# Patient Record
Sex: Male | Born: 1945
Health system: Southern US, Community
[De-identification: ages and names within clinical notes are randomized; demographics above are authoritative.]

## PROBLEM LIST (undated history)

## (undated) DIAGNOSIS — N529 Male erectile dysfunction, unspecified: Secondary | ICD-10-CM

## (undated) DIAGNOSIS — R3912 Poor urinary stream: Secondary | ICD-10-CM

## (undated) DIAGNOSIS — Z87440 Personal history of urinary (tract) infections: Secondary | ICD-10-CM

## (undated) DIAGNOSIS — B019 Varicella without complication: Secondary | ICD-10-CM

## (undated) DIAGNOSIS — R7303 Prediabetes: Secondary | ICD-10-CM

## (undated) DIAGNOSIS — I5021 Acute systolic (congestive) heart failure: Secondary | ICD-10-CM

## (undated) DIAGNOSIS — E785 Hyperlipidemia, unspecified: Secondary | ICD-10-CM

## (undated) DIAGNOSIS — I2510 Atherosclerotic heart disease of native coronary artery without angina pectoris: Secondary | ICD-10-CM

## (undated) DIAGNOSIS — Z973 Presence of spectacles and contact lenses: Secondary | ICD-10-CM

## (undated) DIAGNOSIS — C61 Malignant neoplasm of prostate: Secondary | ICD-10-CM

## (undated) DIAGNOSIS — D649 Anemia, unspecified: Secondary | ICD-10-CM

## (undated) DIAGNOSIS — R3915 Urgency of urination: Secondary | ICD-10-CM

## (undated) DIAGNOSIS — R972 Elevated prostate specific antigen [PSA]: Secondary | ICD-10-CM

## (undated) DIAGNOSIS — I5032 Chronic diastolic (congestive) heart failure: Secondary | ICD-10-CM

## (undated) DIAGNOSIS — I10 Essential (primary) hypertension: Secondary | ICD-10-CM

## (undated) HISTORY — PX: PROSTATE BIOPSY: SHX241

## (undated) HISTORY — DX: Essential (primary) hypertension: I10

## (undated) HISTORY — PX: TURP VAPORIZATION: SUR1397

## (undated) HISTORY — DX: Anemia, unspecified: D64.9

## (undated) HISTORY — DX: Varicella without complication: B01.9

---

## 2005-01-07 ENCOUNTER — Ambulatory Visit: Payer: Self-pay | Admitting: Urology

## 2005-02-11 ENCOUNTER — Ambulatory Visit: Payer: Self-pay | Admitting: Urology

## 2005-06-17 ENCOUNTER — Ambulatory Visit: Payer: Self-pay | Admitting: Internal Medicine

## 2005-06-18 DIAGNOSIS — C61 Malignant neoplasm of prostate: Secondary | ICD-10-CM

## 2005-06-18 HISTORY — DX: Malignant neoplasm of prostate: C61

## 2011-05-06 ENCOUNTER — Emergency Department: Payer: Self-pay | Admitting: Emergency Medicine

## 2011-05-16 ENCOUNTER — Emergency Department: Payer: Self-pay | Admitting: Emergency Medicine

## 2013-07-22 DIAGNOSIS — N320 Bladder-neck obstruction: Secondary | ICD-10-CM

## 2013-07-22 HISTORY — DX: Bladder-neck obstruction: N32.0

## 2015-11-15 ENCOUNTER — Ambulatory Visit (INDEPENDENT_AMBULATORY_CARE_PROVIDER_SITE_OTHER): Payer: BLUE CROSS/BLUE SHIELD

## 2015-11-15 ENCOUNTER — Ambulatory Visit
Admission: EM | Admit: 2015-11-15 | Discharge: 2015-11-15 | Disposition: A | Discharge status: Home / Self Care | Payer: BLUE CROSS/BLUE SHIELD

## 2015-11-15 DIAGNOSIS — H6123 Impacted cerumen, bilateral: Secondary | ICD-10-CM

## 2015-11-15 DIAGNOSIS — R05 Cough: Secondary | ICD-10-CM

## 2015-11-15 DIAGNOSIS — I517 Cardiomegaly: Secondary | ICD-10-CM

## 2015-11-15 DIAGNOSIS — R059 Cough, unspecified: Secondary | ICD-10-CM

## 2015-11-15 HISTORY — DX: Malignant neoplasm of prostate: C61

## 2015-11-15 MED ORDER — LEVOFLOXACIN 500 MG PO TABS: 500.0000 mg | ORAL_TABLET | Freq: Every day | ORAL | 0 refills | Status: DC

## 2015-11-15 NOTE — ED Triage Notes (Signed)
Patient c/o nagging cough for about 2 months, he says he has been diagnosis with allergies but he doesnt feel like that the problem. The cough comes and goes, it is worse at night when he lays down.

## 2015-11-15 NOTE — ED Provider Notes (Signed)
CSN: [id number]     Arrival date & time 11/15/15  [id number] History   First MD Initiated Contact with Patient 11/15/15 0913     Chief Complaint  Patient presents with  . Cough   (Consider location/radiation/quality/duration/timing/severity/associated sxs/prior Treatment) HPI  This a 70 year old male who is accompanied by his wife waning of a two-month history of nagging cough. He has seen other providers who have diagnosed him with allergies and he has used over-the-counter medications without success. They state is worse at nighttime when he lays down and his wife is noticed that he has been wheezing a lot. In addition he has exertional dyspnea which is worsening over time. Cough is nonproductive. He denies any fever or chills. He is afebrile today and his O2 sats are 100% on room air. He has no history of asthma or COPD. He has never smoked. They have no primary care physician he only sees Dr. Burt Knack who is his cancer specialist for the prostatic cancer that he's had. He thinks they remember that the cough began after a mild cold that he had initially.He denies any PND or orthopnea.      Past Medical History:  Diagnosis Date  . Prostate cancer [facility])    Past Surgical History:  Procedure Laterality Date  . TURP VAPORIZATION     Family History  Problem Relation Age of Onset  . Hypertension Mother   . Healthy Father    Social History  Substance Use Topics  . Smoking status: Never Smoker  . Smokeless tobacco: Never Used  . Alcohol use No    Review of Systems  Constitutional: Positive for activity change. Negative for chills, diaphoresis, fatigue and fever.  HENT: Negative for congestion, sinus pressure and sore throat.   Respiratory: Positive for cough and shortness of breath.   All other systems reviewed and are negative.   Allergies  Review of patient's allergies indicates no known allergies.  Home Medications   Prior to Admission medications   Medication Sig Start Date End  Date Taking? Authorizing Provider  finasteride (PROSCAR) 5 MG tablet Take 5 mg by mouth daily.   Yes Historical Provider, MD  levofloxacin (LEVAQUIN) 500 MG tablet Take 1 tablet (500 mg total) by mouth daily. 11/15/15   Lorin Picket, PA-C   Meds Ordered and Administered this Visit  Medications - No data to display  BP 119/83 (BP Location: Left Arm)   Pulse 88   Temp 97.5 F (36.4 C) (Oral)   Resp 18   Ht 6\' 1"  (1.854 m)   Wt 235 lb (106.6 kg)   SpO2 100%   BMI 31.00 kg/m  No data found.   Physical Exam  Constitutional: He is oriented to person, place, and time. He appears well-developed and well-nourished.  HENT:  Head: Normocephalic and atraumatic.  Right Ear: External ear normal.  Left Ear: External ear normal.  Nose: Nose normal.  Mouth/Throat: Oropharynx is clear and moist. No oropharyngeal exudate.  Patient has bilateral cerumen impaction  Eyes: EOM are normal. Pupils are equal, round, and reactive to light. Right eye exhibits no discharge. Left eye exhibits no discharge.  Neck: Normal range of motion. Neck supple.  Pulmonary/Chest: Effort normal. He has rales.  Patient has bibasilar rales most prevalent on the right.  Musculoskeletal: Normal range of motion. He exhibits edema.  Patient has 1-2+ edema of the bilateral lower extremities.  Neurological: He is alert and oriented to person, place, and time.  Skin: Skin is warm and dry.  Psychiatric: He has a normal mood and affect. His behavior is normal. Judgment and thought content normal.  Nursing note and vitals reviewed.   Urgent Care Course   Clinical Course    Procedures (including critical care time)  Labs Review Labs Reviewed - No data to display  Imaging Review Dg Chest 2 View  Result Date: 11/15/2015 CLINICAL DATA:  Cough for 2 months.  Shortness of breath for 2 weeks EXAM: CHEST  2 VIEW COMPARISON:  None. FINDINGS: There is no edema or consolidation. There is cardiomegaly with mild pulmonary  venous hypertension. No adenopathy. There is degenerative change in the thoracic spine. IMPRESSION: There is a degree of pulmonary vascular congestion without frank edema or consolidation. Electronically Signed   By: Lowella Grip III M.D.   On: 11/15/2015 09:47     Visual Acuity Review  Right Eye Distance:   Left Eye Distance:   Bilateral Distance:    Right Eye Near:   Left Eye Near:    Bilateral Near:     Patient had both ears irrigated and curetted to remove the cerumen.    MDM   1. Cough   2. Cardiomegaly   3. Cerumen impaction, bilateral    Discharge Medication List as of 11/15/2015 10:33 AM    START taking these medications   Details  levofloxacin (LEVAQUIN) 500 MG tablet Take 1 tablet (500 mg total) by mouth daily., Starting Thu 11/15/2015, Normal      Plan: 1. Test/x-ray results and diagnosis reviewed with patient 2. rx as per orders; risks, benefits, potential side effects reviewed with patient 3. Recommend supportive treatment with Decreased salt intake. She is wife will make an appointment with a primary care physician this week for further evaluation and treatment. Her primary care physician should have access to our medical records that she can return to our facility for a disc of his chest x-ray. Start him empirically on levofloxacin which may be altered by the primary care physician at his discretion. 4. F/u prn if symptoms worsen or don't improve     Lorin Picket, PA-C 11/15/15 1037

## 2015-11-15 NOTE — Discharge Instructions (Signed)
Arrange an appointment with the primary care physician within the next week for further evaluation and treatment.

## 2015-11-21 ENCOUNTER — Emergency Department: Payer: BLUE CROSS/BLUE SHIELD

## 2015-11-21 ENCOUNTER — Inpatient Hospital Stay
Admission: EM | Admit: 2015-11-21 | Discharge: 2015-11-22 | DRG: 286 | Disposition: A | Discharge status: Other Acute Inpatient Hospital | Payer: BLUE CROSS/BLUE SHIELD | Attending: Internal Medicine | Admitting: Internal Medicine

## 2015-11-21 ENCOUNTER — Inpatient Hospital Stay (HOSPITAL_COMMUNITY)
Admit: 2015-11-21 | Discharge: 2015-11-21 | Disposition: A | Discharge status: Home / Self Care | Payer: BLUE CROSS/BLUE SHIELD | Attending: Internal Medicine | Admitting: Internal Medicine

## 2015-11-21 ENCOUNTER — Encounter: Payer: Self-pay | Admitting: Internal Medicine

## 2015-11-21 DIAGNOSIS — Z951 Presence of aortocoronary bypass graft: Secondary | ICD-10-CM | POA: Diagnosis not present

## 2015-11-21 DIAGNOSIS — I509 Heart failure, unspecified: Secondary | ICD-10-CM

## 2015-11-21 DIAGNOSIS — I255 Ischemic cardiomyopathy: Secondary | ICD-10-CM | POA: Diagnosis present

## 2015-11-21 DIAGNOSIS — I25119 Atherosclerotic heart disease of native coronary artery with unspecified angina pectoris: Secondary | ICD-10-CM | POA: Diagnosis not present

## 2015-11-21 DIAGNOSIS — I5021 Acute systolic (congestive) heart failure: Secondary | ICD-10-CM

## 2015-11-21 DIAGNOSIS — Z8546 Personal history of malignant neoplasm of prostate: Secondary | ICD-10-CM | POA: Diagnosis present

## 2015-11-21 DIAGNOSIS — Z8249 Family history of ischemic heart disease and other diseases of the circulatory system: Secondary | ICD-10-CM | POA: Diagnosis not present

## 2015-11-21 DIAGNOSIS — M7989 Other specified soft tissue disorders: Secondary | ICD-10-CM

## 2015-11-21 DIAGNOSIS — I2582 Chronic total occlusion of coronary artery: Secondary | ICD-10-CM | POA: Diagnosis present

## 2015-11-21 DIAGNOSIS — Z01810 Encounter for preprocedural cardiovascular examination: Secondary | ICD-10-CM | POA: Diagnosis not present

## 2015-11-21 DIAGNOSIS — C61 Malignant neoplasm of prostate: Secondary | ICD-10-CM

## 2015-11-21 DIAGNOSIS — I2510 Atherosclerotic heart disease of native coronary artery without angina pectoris: Secondary | ICD-10-CM | POA: Diagnosis not present

## 2015-11-21 DIAGNOSIS — N179 Acute kidney failure, unspecified: Secondary | ICD-10-CM | POA: Diagnosis present

## 2015-11-21 DIAGNOSIS — J189 Pneumonia, unspecified organism: Secondary | ICD-10-CM | POA: Diagnosis present

## 2015-11-21 DIAGNOSIS — R0602 Shortness of breath: Secondary | ICD-10-CM

## 2015-11-21 DIAGNOSIS — E785 Hyperlipidemia, unspecified: Secondary | ICD-10-CM | POA: Diagnosis not present

## 2015-11-21 DIAGNOSIS — I5023 Acute on chronic systolic (congestive) heart failure: Secondary | ICD-10-CM | POA: Diagnosis present

## 2015-11-21 DIAGNOSIS — I420 Dilated cardiomyopathy: Secondary | ICD-10-CM

## 2015-11-21 HISTORY — DX: Hyperlipidemia, unspecified: E78.5

## 2015-11-21 LAB — BASIC METABOLIC PANEL
Anion gap: 8 (ref 5–15)
BUN: 15 mg/dL (ref 6–20)
CALCIUM: 8.8 mg/dL — AB (ref 8.9–10.3)
CHLORIDE: 110 mmol/L (ref 101–111)
CO2: 22 mmol/L (ref 22–32)
CREATININE: 1.17 mg/dL (ref 0.61–1.24)
GFR calc non Af Amer: >60 mL/min (ref >60)
Glucose, Bld: 127 mg/dL — ABNORMAL HIGH (ref 65–99)
Potassium: 3.9 mmol/L (ref 3.5–5.1)
SODIUM: 140 mmol/L (ref 135–145)

## 2015-11-21 LAB — CBC
HCT: 35.7 % — ABNORMAL LOW (ref 40.0–52.0)
Hemoglobin: 12.4 g/dL — ABNORMAL LOW (ref 13.0–18.0)
MCH: 32.3 pg (ref 26.0–34.0)
MCHC: 34.7 g/dL (ref 32.0–36.0)
MCV: 93.3 fL (ref 80.0–100.0)
Platelets: 143 10*3/uL — ABNORMAL LOW (ref 150–440)
RBC: 3.83 MIL/uL — ABNORMAL LOW (ref 4.40–5.90)
RDW: 15.9 % — AB (ref 11.5–14.5)
WBC: 5.2 10*3/uL (ref 3.8–10.6)

## 2015-11-21 LAB — BRAIN NATRIURETIC PEPTIDE: B NATRIURETIC PEPTIDE 5: 1125 pg/mL — AB (ref 0.0–100.0)

## 2015-11-21 LAB — ECHOCARDIOGRAM COMPLETE
HEIGHTINCHES: 73 in
Weight: 3894.4 oz

## 2015-11-21 LAB — LIPID PANEL
Cholesterol: 177 mg/dL (ref 0–200)
HDL: 41 mg/dL (ref >40)
LDL CALC: 116 mg/dL — AB (ref 0–99)
Total CHOL/HDL Ratio: 4.3 RATIO
Triglycerides: 101 mg/dL (ref <150)
VLDL: 20 mg/dL (ref 0–40)

## 2015-11-21 LAB — TSH: TSH: 1.386 u[IU]/mL (ref 0.350–4.500)

## 2015-11-21 LAB — TROPONIN I
TROPONIN I: 0.03 ng/mL — AB (ref <0.03)
TROPONIN I: 0.03 ng/mL — AB (ref <0.03)
TROPONIN I: 0.04 ng/mL — AB (ref <0.03)

## 2015-11-21 LAB — MAGNESIUM: MAGNESIUM: 2 mg/dL (ref 1.7–2.4)

## 2015-11-21 MED ORDER — SODIUM CHLORIDE 0.9% FLUSH
3.0000 mL | Freq: Two times a day (BID) | INTRAVENOUS | Status: DC
  Administered 2015-11-21: 3 mL via INTRAVENOUS

## 2015-11-21 MED ORDER — ASPIRIN 81 MG PO CHEW
81.0000 mg | CHEWABLE_TABLET | ORAL | Status: AC
  Administered 2015-11-22: 81 mg via ORAL
  Filled 2015-11-21: qty 1

## 2015-11-21 MED ORDER — FUROSEMIDE 10 MG/ML IJ SOLN
40.0000 mg | Freq: Two times a day (BID) | INTRAMUSCULAR | Status: DC
  Administered 2015-11-21 – 2015-11-22 (×3): 40 mg via INTRAVENOUS
  Filled 2015-11-21 (×3): qty 4

## 2015-11-21 MED ORDER — ENOXAPARIN SODIUM 40 MG/0.4ML ~~LOC~~ SOLN
40.0000 mg | SUBCUTANEOUS | Status: DC
Start: 2015-11-21 — End: 2015-11-22
  Administered 2015-11-21: 40 mg via SUBCUTANEOUS
  Filled 2015-11-21: qty 0.4

## 2015-11-21 MED ORDER — LEVOFLOXACIN 500 MG PO TABS
500.0000 mg | ORAL_TABLET | Freq: Every day | ORAL | Status: DC
  Administered 2015-11-21: 500 mg via ORAL
  Filled 2015-11-21: qty 1

## 2015-11-21 MED ORDER — ONDANSETRON HCL 4 MG/2ML IJ SOLN: 4.0000 mg | Freq: Four times a day (QID) | INTRAMUSCULAR | Status: DC | PRN

## 2015-11-21 MED ORDER — FINASTERIDE 5 MG PO TABS
5.0000 mg | ORAL_TABLET | Freq: Every day | ORAL | Status: DC
  Administered 2015-11-21: 5 mg via ORAL
  Filled 2015-11-21: qty 1

## 2015-11-21 MED ORDER — ACETAMINOPHEN 325 MG PO TABS: 650.0000 mg | ORAL_TABLET | Freq: Four times a day (QID) | ORAL | Status: DC | PRN

## 2015-11-21 MED ORDER — SODIUM CHLORIDE 0.9 % IV SOLN: 250.0000 mL | INTRAVENOUS | Status: DC | PRN

## 2015-11-21 MED ORDER — POTASSIUM CHLORIDE CRYS ER 20 MEQ PO TBCR
20.0000 meq | EXTENDED_RELEASE_TABLET | Freq: Two times a day (BID) | ORAL | Status: DC
  Administered 2015-11-21 (×2): 20 meq via ORAL
  Filled 2015-11-21 (×2): qty 1

## 2015-11-21 MED ORDER — SODIUM CHLORIDE 0.9 % IV SOLN: INTRAVENOUS | Status: DC

## 2015-11-21 MED ORDER — SODIUM CHLORIDE 0.9% FLUSH
3.0000 mL | INTRAVENOUS | Status: DC | PRN
Start: 2015-11-21 — End: 2015-11-22

## 2015-11-21 MED ORDER — DOCUSATE SODIUM 100 MG PO CAPS
100.0000 mg | ORAL_CAPSULE | Freq: Two times a day (BID) | ORAL | Status: DC
  Administered 2015-11-21 (×2): 100 mg via ORAL
  Filled 2015-11-21 (×2): qty 1

## 2015-11-21 MED ORDER — ONDANSETRON HCL 4 MG PO TABS: 4.0000 mg | ORAL_TABLET | Freq: Four times a day (QID) | ORAL | Status: DC | PRN

## 2015-11-21 MED ORDER — FUROSEMIDE 40 MG PO TABS
40.0000 mg | ORAL_TABLET | Freq: Once | ORAL | Status: AC
  Administered 2015-11-21: 40 mg via ORAL
  Filled 2015-11-21: qty 1

## 2015-11-21 MED ORDER — ACETAMINOPHEN 650 MG RE SUPP: 650.0000 mg | Freq: Four times a day (QID) | RECTAL | Status: DC | PRN

## 2015-11-21 NOTE — Discharge Instructions (Signed)
Heart Failure Clinic appointment on December 12, 2015 at 9:30am with Darylene Price, [place]. Please call [phone] to reschedule.

## 2015-11-21 NOTE — Care Management (Signed)
Patient admitted with new diagnosis of CHF.  Cardiology consult pending.  Patient does not have a PCP, but wife says she got an appointment with a clinic in [place] for 10/10.  Does not remember the name.  Referral to Heart Failure Clinic. has scales at home for daily weights.   Independent in all adls, denies issues accessing medical care, obtaining medications or with transportation.

## 2015-11-21 NOTE — H&P (Signed)
[place] at [place] NAME: Shane Kim    MR#:  [id number]  DATE OF BIRTH:  August 19, 1945  DATE OF ADMISSION:  11/21/2015  PRIMARY CARE PHYSICIAN: No PCP Per Patient   REQUESTING/REFERRING PHYSICIAN: Dr. Marjean Donna  CHIEF COMPLAINT:   Chief Complaint  Patient presents with  . Shortness of Breath    HISTORY OF PRESENT ILLNESS:  Shane Kim  is a 70 y.o. male with a known history of Prostate cancer presents to hospital secondary to worsening shortness of breath. Symptoms started about 3-4 weeks ago, he was getting short of breath easily on minimal exertion. Denies any fevers or chills. No nausea or vomiting or cough. Went to urgent care about a week ago and chest x-ray at that time showed early pneumonia and was started on Levaquin. Shortness of breath hasn't been improving and also he started noticing increased swelling of his both lower extremities and so presented to the emergency room. He appears very stable at this time, his oxygen saturations are stable. Breathing appears very stable. He does not have a physician and so is being admitted for diuresis.  PAST MEDICAL HISTORY:   Past Medical History:  Diagnosis Date  . Prostate cancer ([place])     PAST SURGICAL HISTORY:   Past Surgical History:  Procedure Laterality Date  . TURP VAPORIZATION      SOCIAL HISTORY:   Social History  Substance Use Topics  . Smoking status: Never Smoker  . Smokeless tobacco: Never Used  . Alcohol use No    FAMILY HISTORY:   Family History  Problem Relation Age of Onset  . Hypertension Mother   . Healthy Father     DRUG ALLERGIES:  No Known Allergies  REVIEW OF SYSTEMS:   Review of Systems  Constitutional: Negative for chills, fever, malaise/fatigue and weight loss.  HENT: Negative for ear discharge, ear pain, hearing loss, nosebleeds and tinnitus.   Eyes: Negative for blurred vision, double vision and photophobia.    Respiratory: Positive for shortness of breath. Negative for cough, hemoptysis and wheezing.   Cardiovascular: Positive for leg swelling. Negative for chest pain, palpitations and orthopnea.  Gastrointestinal: Negative for abdominal pain, constipation, diarrhea, heartburn, melena, nausea and vomiting.  Genitourinary: Negative for dysuria, frequency, hematuria and urgency.  Musculoskeletal: Negative for back pain, myalgias and neck pain.  Skin: Negative for rash.  Neurological: Negative for dizziness, tingling, tremors, sensory change, speech change, focal weakness and headaches.  Endo/Heme/Allergies: Does not bruise/bleed easily.  Psychiatric/Behavioral: Negative for depression.    MEDICATIONS AT HOME:   Prior to Admission medications   Medication Sig Start Date End Date Taking? Authorizing Provider  finasteride (PROSCAR) 5 MG tablet Take 5 mg by mouth daily.   Yes Historical Provider, MD  levofloxacin (LEVAQUIN) 500 MG tablet Take 1 tablet (500 mg total) by mouth daily. 11/15/15  Yes Lorin Picket, PA-C      VITAL SIGNS:  Blood pressure (!) 120/94, pulse 93, temperature 98.4 F (36.9 C), temperature source Oral, height 6\' 1"  (1.854 m), weight 106.6 kg (235 lb), SpO2 95 %.  PHYSICAL EXAMINATION:   Physical Exam  GENERAL:  70 y.o.-year-old patient lying in the bed with no acute distress.  EYES: Pupils equal, round, reactive to light and accommodation. No scleral icterus. Extraocular muscles intact.  HEENT: Head atraumatic, normocephalic. Oropharynx and nasopharynx clear.  NECK:  Supple, no jugular venous distention. No thyroid enlargement, no tenderness.  LUNGS: Normal breath sounds bilaterally,  no wheezing, rhonchi or crepitation.Bibasilar crackles heard both sides posteriorly. No use of accessory muscles of respiration.  CARDIOVASCULAR: S1, S2 normal. No murmurs, rubs, or gallops.  ABDOMEN: Soft, nontender, nondistended. Bowel sounds present. No organomegaly or mass.   EXTREMITIES: No  cyanosis, or clubbing. 2+ pitting edema both lower extremities noted NEUROLOGIC: Cranial nerves II through XII are intact. Muscle strength 5/5 in all extremities. Sensation intact. Gait not checked.  PSYCHIATRIC: The patient is alert and oriented x 3.  SKIN: No obvious rash, lesion, or ulcer.   LABORATORY PANEL:   CBC  Recent Labs Lab 11/21/15 0020  WBC 5.2  HGB 12.4*  HCT 35.7*  PLT 143*   ------------------------------------------------------------------------------------------------------------------  Chemistries   Recent Labs Lab 11/21/15 0020  NA 140  K 3.9  CL 110  CO2 22  GLUCOSE 127*  BUN 15  CREATININE 1.17  CALCIUM 8.8*   ------------------------------------------------------------------------------------------------------------------  Cardiac Enzymes No results for input(s): TROPONINI in the last 168 hours. ------------------------------------------------------------------------------------------------------------------  RADIOLOGY:  Dg Chest 2 View  Result Date: 11/21/2015 CLINICAL DATA:  Cough. Shortness of breath last week, now at baseline. History of prostate cancer. EXAM: CHEST  2 VIEW COMPARISON:  Chest radiograph November 15, 2015 FINDINGS: The cardiac silhouette is moderately enlarged, mediastinal silhouette is nonsuspicious. Mildly calcified aortic knob. Pulmonary vascular congestion. Small RIGHT pleural effusion. Hazy bibasilar airspace opacities. Small RIGHT pleural effusion. No pneumothorax. Soft tissue planes and included osseous structures are nonsuspicious. Moderate degenerative change of the thoracic spine. IMPRESSION: Stable cardiomegaly and pulmonary vascular congestion. Trace RIGHT pleural effusion. Bibasilar atelectasis, confluent edema, less likely early pneumonia. Electronically Signed   By: Elon Alas M.D.   On: 11/21/2015 04:29    EKG:   Orders placed or performed during the hospital encounter of 11/21/15  .  ED EKG  . ED EKG    IMPRESSION AND PLAN:   Shane Kim  is a 70 y.o. male with a known history of Prostate cancer presents to hospital secondary to worsening shortness of breath.  #1 new onset CHF-admit to telemetry, recycle troponins. -Low sodium diet, daily weight checks. -Cardiology consulted. Echocardiogram ordered. -Started on Lasix IV twice a day at this time -Check proBNP tomorrow and chest x-ray tomorrow  #2 pneumonia recently started on Levaquin for pneumonia. Will need 2 more days to finish off the course.  #3 history of prostate cancer-stable. Continue Proscar  #4 CK D-borderline renal function. No prior baseline available. Continue to monitor while on Lasix.  #5 DVT prophylaxis-Lovenox    All the records are reviewed and case discussed with ED provider. Management plans discussed with the patient, family and they are in agreement.  CODE STATUS: Full code  TOTAL TIME TAKING CARE OF THIS PATIENT: 50 minutes.    Gladstone Lighter M.D on 11/21/2015 at 7:27 AM  Between 7am to 6pm - Pager - [phone]  After 6pm go to [email or web address] - password [email or web address] [place]  Office  [phone]  CC: Primary care physician; No PCP Per Patient

## 2015-11-21 NOTE — Progress Notes (Signed)
  Echocardiogram 2D Echocardiogram has been performed.  Jennette Dubin 11/21/2015, 9:59 AM

## 2015-11-21 NOTE — ED Triage Notes (Addendum)
Pt in with co shob since last week, saw urgent care last week and unsure of what he was dx with. Denies any shob or pain at this time states "feels normal". States here because his wife want him to have follow up apptm before schedule apptm 10/10.  Pt brought by wife, he states "I am fine".

## 2015-11-21 NOTE — Consult Note (Signed)
Cardiology Consultation Note  Patient ID: Shane Kim, MRN: [id number], DOB/AGE: May 01, 1945 70 y.o. Admit date: 11/21/2015   Date of Consult: 11/21/2015 Primary Physician: No PCP Per Patient Primary Cardiologist: New to [facility] Requesting Physician: Dr. Tressia Miners, MD  Chief Complaint: SOB/LE swelling Reason for Consult: Acute CHF  HPI: 70 y.o. male with h/o prostate cancer s/p prior prostate cryoablation and HLD who presented to [facility] with a 3-4 week history of increased SOB and LE swelling.   He has no previously known cardiac history and has never seen a cardiologist before. No prior ischemic evaluations. Over the past 3-4 weeks he has noted increased SOB and nonproductive cough. He saw outside MD at that time and was diagnosed with PNA and placed on Levaquin. His SOB did not improve and this was followed by the development of LE swelling. He has had an 8 pounds weight gain at time of admission as below, though he was not aware of this until it was brought to his attention. Over this same 3-4 week time span he has had an increase in orthopnea, now using 2 pillows (previously 1 pillow). No early satiety. No decrease in functional capacity.   Upon the patient's arrival to [facility] they were found to have BNP 1125, SCr 1.17, K+ 3.9, hgb 12.4, wbc 5.2. ECG as below, CXR showed stable cardiomegaly with pulmonary vascular congestion. Trace right pleural effusio. Confluent edema, less likely early PNA. He was given PO Lasix 40 mg in the ED. UOP pending. He was started on IV Lasix 40 mg bid at time of admission. Echo is pending. Baseline weight 235. Admission weight 243 pounds. Currently, with some improvement in breathing.    Past Medical History:  Diagnosis Date  . HLD (hyperlipidemia)   . Prostate cancer ([place])       Most Recent Cardiac Studies: none   Surgical History:  Past Surgical History:  Procedure Laterality Date  . TURP VAPORIZATION       Home Meds: Prior to Admission medications     Medication Sig Start Date End Date Taking? Authorizing Provider  finasteride (PROSCAR) 5 MG tablet Take 5 mg by mouth daily.   Yes Historical Provider, MD  levofloxacin (LEVAQUIN) 500 MG tablet Take 1 tablet (500 mg total) by mouth daily. 11/15/15  Yes Lorin Picket, PA-C    Inpatient Medications:  . docusate sodium  100 mg Oral BID  . enoxaparin (LOVENOX) injection  40 mg Subcutaneous Q24H  . finasteride  5 mg Oral Daily  . furosemide  40 mg Intravenous BID  . levofloxacin  500 mg Oral Daily  . sodium chloride flush  3 mL Intravenous Q12H      Allergies: No Known Allergies  Social History   Social History  . Marital status: Married    Spouse name: N/A  . Number of children: N/A  . Years of education: N/A   Occupational History  . Not on file.   Social History Main Topics  . Smoking status: Never Smoker  . Smokeless tobacco: Never Used  . Alcohol use No  . Drug use: No  . Sexual activity: Not on file   Other Topics Concern  . Not on file   Social History Narrative   Lives at home with his wife. Independent at baseline     Family History  Problem Relation Age of Onset  . Hypertension Mother   . Healthy Father      Review of Systems: Review of Systems  Constitutional: Positive for  malaise/fatigue. Negative for chills, diaphoresis, fever and weight loss.  HENT: Negative for congestion.   Eyes: Negative for discharge and redness.  Respiratory: Positive for cough and shortness of breath. Negative for sputum production and wheezing.   Cardiovascular: Positive for orthopnea and leg swelling. Negative for chest pain, palpitations, claudication and PND.  Gastrointestinal: Negative for abdominal pain, heartburn, nausea and vomiting.  Musculoskeletal: Negative for falls and myalgias.  Skin: Negative for rash.  Neurological: Positive for weakness. Negative for dizziness, tingling, tremors, sensory change, speech change, focal weakness and loss of consciousness.   Endo/Heme/Allergies: Does not bruise/bleed easily.  Psychiatric/Behavioral: Negative for substance abuse. The patient is not nervous/anxious.   All other systems reviewed and are negative.   Labs: No results for input(s): CKTOTAL, CKMB, TROPONINI in the last 72 hours. Lab Results  Component Value Date   WBC 5.2 11/21/2015   HGB 12.4 (L) 11/21/2015   HCT 35.7 (L) 11/21/2015   MCV 93.3 11/21/2015   PLT 143 (L) 11/21/2015     Recent Labs Lab 11/21/15 0020  NA 140  K 3.9  CL 110  CO2 22  BUN 15  CREATININE 1.17  CALCIUM 8.8*  GLUCOSE 127*   No results found for: CHOL, HDL, LDLCALC, TRIG No results found for: DDIMER  Radiology/Studies:  Dg Chest 2 View  Result Date: 11/21/2015 CLINICAL DATA:  Cough. Shortness of breath last week, now at baseline. History of prostate cancer. EXAM: CHEST  2 VIEW COMPARISON:  Chest radiograph November 15, 2015 FINDINGS: The cardiac silhouette is moderately enlarged, mediastinal silhouette is nonsuspicious. Mildly calcified aortic knob. Pulmonary vascular congestion. Small RIGHT pleural effusion. Hazy bibasilar airspace opacities. Small RIGHT pleural effusion. No pneumothorax. Soft tissue planes and included osseous structures are nonsuspicious. Moderate degenerative change of the thoracic spine. IMPRESSION: Stable cardiomegaly and pulmonary vascular congestion. Trace RIGHT pleural effusion. Bibasilar atelectasis, confluent edema, less likely early pneumonia. Electronically Signed   By: Elon Alas M.D.   On: 11/21/2015 04:29   Dg Chest 2 View  Result Date: 11/15/2015 CLINICAL DATA:  Cough for 2 months.  Shortness of breath for 2 weeks EXAM: CHEST  2 VIEW COMPARISON:  None. FINDINGS: There is no edema or consolidation. There is cardiomegaly with mild pulmonary venous hypertension. No adenopathy. There is degenerative change in the thoracic spine. IMPRESSION: There is a degree of pulmonary vascular congestion without frank edema or consolidation.  Electronically Signed   By: Lowella Grip III M.D.   On: 11/15/2015 09:47    EKG: Interpreted by me showed: NSR, 93 bpm, nonspecific lateral st/t changes  Telemetry: Interpreted by me showed: NSR, upper 90's bpm  Weights: [employer]   11/21/15 0016 11/21/15 0807  Weight: 235 lb (106.6 kg) 243 lb 6.4 oz (110.4 kg)     Physical Exam: Blood pressure (!) 126/92, pulse (!) 103, temperature 98 F (36.7 C), temperature source Oral, resp. rate 18, height 6\' 1"  (1.854 m), weight 243 lb 6.4 oz (110.4 kg), SpO2 100 %. Body mass index is 32.11 kg/m. General: Well developed, well nourished, in no acute distress. Head: Normocephalic, atraumatic, sclera non-icteric, no xanthomas, nares are without discharge.  Neck: Negative for carotid bruits. JVD not elevated. Lungs: Decreased breath sounds bilaterally with diffuse crackles. Breathing is unlabored. Heart: RRR with S1 S2. No murmurs, rubs, or gallops appreciated. Abdomen: Soft, non-tender, non-distended with normoactive bowel sounds. No hepatomegaly. No rebound/guarding. No obvious abdominal masses. Msk:  Strength and tone appear normal for age. Extremities: No clubbing or cyanosis.  1+ pitting edema. Distal pedal pulses are 2+ and equal bilaterally. Neuro: Alert and oriented X 3. No facial asymmetry. No focal deficit. Moves all extremities spontaneously. Psych:  Responds to questions appropriately with a normal affect.    Assessment and Plan:  Principal Problem:   CHF (congestive heart failure) ([place]) Active Problems:   Prostate cancer ([place])   AKI (acute kidney injury) ([place])   HLD (hyperlipidemia)    1. New onset CHF, likely systolic: -EF unknown at this time, preliminary reading of 20-25% -Echo pending, if EF is reduced may need ischemic evaluation s/p diuresis -Never with any chest pain  -Continue IV Lasix 40 mg bid with KCl repletion  -CHF education   2. Elevated troponin: -Initial troponin mildly elevated at 0.03 -Never  with chest pain -Continue to cycle -If this remains mildly elevated and flat trending this is likely 2/2 supply demand ischemia in the setting of volume overload -Will need ischemic evaluation as above -Check echo as above  3. Possible PNA: -Per IM  4. Prostate cancer s/p cryoablation: -Per IM  5. AKI: -Baseline unknown -Monitor with diuresis   6. HLD: -Check lipid and A1C   Signed, Christell Faith, PA-C [facility] Pager: [phone] 11/21/2015, 8:56 AM

## 2015-11-21 NOTE — Progress Notes (Signed)
Initial Heart Failure Clinic appointment scheduled for December 12, 2015 at 9:30am. Thank you.

## 2015-11-21 NOTE — ED Provider Notes (Signed)
[facility] Emergency Department Provider Note    First MD Initiated Contact with Patient 11/21/15 [phone]     (approximate)  I have reviewed the triage vital signs and the nursing notes.   HISTORY  Chief Complaint Shortness of Breath   HPI Shane Kim is a 70 y.o. male with history of prostate cancer presents to the emergency department with progressive dyspnea, orthopnea and cough 1 month. Patient states that he was seen at the urgent care in [facility] and informed that this was secondary to allergies and prescribed an antibiotic. Patient now comes to the emergency department with worsening dyspnea tonight. Patient denies any chest pain   Past Medical History:  Diagnosis Date  . Prostate cancer ([place])     There are no active problems to display for this patient.   Past Surgical History:  Procedure Laterality Date  . TURP VAPORIZATION      Prior to Admission medications   Medication Sig Start Date End Date Taking? Authorizing Provider  finasteride (PROSCAR) 5 MG tablet Take 5 mg by mouth daily.   Yes Historical Provider, MD  levofloxacin (LEVAQUIN) 500 MG tablet Take 1 tablet (500 mg total) by mouth daily. 11/15/15  Yes Lorin Picket, PA-C    Allergies No known drug allergies  Family History  Problem Relation Age of Onset  . Hypertension Mother   . Healthy Father     Social History Social History  Substance Use Topics  . Smoking status: Never Smoker  . Smokeless tobacco: Never Used  . Alcohol use No    Review of Systems Constitutional: No fever/chills Eyes: No visual changes. ENT: No sore throat. Cardiovascular: Denies chest pain. Respiratory: Positive for dyspnea shortness of breath. Gastrointestinal: No abdominal pain.  No nausea, no vomiting.  No diarrhea.  No constipation. Genitourinary: Negative for dysuria. Musculoskeletal: Negative for back pain. Positive for lower extremity swelling Skin: Negative for  rash. Neurological: Negative for headaches, focal weakness or numbness.  10-point ROS otherwise negative.  ____________________________________________   PHYSICAL EXAM:  VITAL SIGNS: ED Triage Vitals [11/21/15 0016]  Enc Vitals Group     BP 127/76     Pulse Rate (!) 109     Resp      Temp 100 F (37.8 C)     Temp Source Oral     SpO2 97 %     Weight 235 lb (106.6 kg)     Height 6\' 1"  (1.854 m)     Head Circumference      Peak Flow      Pain Score 0     Pain Loc      Pain Edu?      Excl. in [place]?     Constitutional: Alert and oriented. Well appearing and in no acute distress. Eyes: Conjunctivae are normal. PERRL. EOMI. Head: Atraumatic. Ears:  Healthy appearing ear canals and TMs bilaterally Nose: No congestion/rhinnorhea. Mouth/Throat: Mucous membranes are moist.  Oropharynx non-erythematous. Neck: No stridor.  No meningeal signs.  Cardiovascular: Normal rate, regular rhythm. Good peripheral circulation. Grossly normal heart sounds. Respiratory: Normal respiratory effort.  No retractions. Bibasilar rales Gastrointestinal: Soft and nontender. No distention.  Musculoskeletal: 2+ bilateral lower extremity edema No gross deformities of extremities. Neurologic:  Normal speech and language. No gross focal neurologic deficits are appreciated.  Skin:  Skin is warm, dry and intact. No rash noted. Psychiatric: Mood and affect are normal. Speech and behavior are normal.  ____________________________________________   LABS (all labs ordered are  listed, but only abnormal results are displayed)  Labs Reviewed  CBC - Abnormal; Notable for the following:       Result Value   RBC 3.83 (*)    Hemoglobin 12.4 (*)    HCT 35.7 (*)    RDW 15.9 (*)    Platelets 143 (*)    All other components within normal limits  BASIC METABOLIC PANEL - Abnormal; Notable for the following:    Glucose, Bld 127 (*)    Calcium 8.8 (*)    All other components within normal limits  BRAIN NATRIURETIC  PEPTIDE - Abnormal; Notable for the following:    B Natriuretic Peptide 1,125.0 (*)    All other components within normal limits   ____________________________________________  EKG   RADIOLOGY I, Angels N Audwin Semper, personally viewed and evaluated these images (plain radiographs) as part of my medical decision making, as well as reviewing the written report by the radiologist.  Dg Chest 2 View  Result Date: 11/21/2015 CLINICAL DATA:  Cough. Shortness of breath last week, now at baseline. History of prostate cancer. EXAM: CHEST  2 VIEW COMPARISON:  Chest radiograph November 15, 2015 FINDINGS: The cardiac silhouette is moderately enlarged, mediastinal silhouette is nonsuspicious. Mildly calcified aortic knob. Pulmonary vascular congestion. Small RIGHT pleural effusion. Hazy bibasilar airspace opacities. Small RIGHT pleural effusion. No pneumothorax. Soft tissue planes and included osseous structures are nonsuspicious. Moderate degenerative change of the thoracic spine. IMPRESSION: Stable cardiomegaly and pulmonary vascular congestion. Trace RIGHT pleural effusion. Bibasilar atelectasis, confluent edema, less likely early pneumonia. Electronically Signed   By: Elon Alas M.D.   On: 11/21/2015 04:29      Procedures    INITIAL IMPRESSION / ASSESSMENT AND PLAN / ED COURSE  Pertinent labs & imaging results that were available during my care of the patient were reviewed by me and considered in my medical decision making (see chart for details).  History physical exam consistent with new onset congestive heart failure given bilateral extremity edema elevated BNP and chest x-ray concerning for cardiomegaly and vascular congestion with pleural effusions. As such patient discussed with Dr. Marcille Blanco for hospital admission for further evaluation and management  Clinical Course    ____________________________________________  FINAL CLINICAL IMPRESSION(S) / ED DIAGNOSES  Final diagnoses:    Acute congestive heart failure, unspecified congestive heart failure type ([place])     MEDICATIONS GIVEN DURING THIS VISIT:  Medications  furosemide (LASIX) tablet 40 mg (40 mg Oral Given 11/21/15 0429)     NEW OUTPATIENT MEDICATIONS STARTED DURING THIS VISIT:  New Prescriptions   No medications on file    Modified Medications   No medications on file    Discontinued Medications   No medications on file     Note:  This document was prepared using Dragon voice recognition software and may include unintentional dictation errors.    Gregor Hams, MD 11/21/15 [phone]

## 2015-11-22 ENCOUNTER — Encounter: Payer: Self-pay | Admitting: Physician Assistant

## 2015-11-22 ENCOUNTER — Other Ambulatory Visit: Payer: Self-pay | Admitting: *Deleted

## 2015-11-22 ENCOUNTER — Encounter
Admission: EM | Disposition: A | Discharge status: Other Acute Inpatient Hospital | Payer: Self-pay | Source: Home / Self Care | Attending: Internal Medicine

## 2015-11-22 ENCOUNTER — Inpatient Hospital Stay (HOSPITAL_COMMUNITY)
Admission: AD | Admit: 2015-11-22 | Discharge: 2015-12-05 | DRG: 233 | Disposition: A | Discharge status: Home / Self Care | Payer: BLUE CROSS/BLUE SHIELD | Source: Other Acute Inpatient Hospital | Attending: Surgery | Admitting: Surgery

## 2015-11-22 DIAGNOSIS — Z951 Presence of aortocoronary bypass graft: Secondary | ICD-10-CM | POA: Diagnosis not present

## 2015-11-22 DIAGNOSIS — I25119 Atherosclerotic heart disease of native coronary artery with unspecified angina pectoris: Secondary | ICD-10-CM | POA: Diagnosis present

## 2015-11-22 DIAGNOSIS — E119 Type 2 diabetes mellitus without complications: Secondary | ICD-10-CM | POA: Diagnosis present

## 2015-11-22 DIAGNOSIS — R000 Tachycardia, unspecified: Secondary | ICD-10-CM | POA: Diagnosis not present

## 2015-11-22 DIAGNOSIS — I2510 Atherosclerotic heart disease of native coronary artery without angina pectoris: Secondary | ICD-10-CM

## 2015-11-22 DIAGNOSIS — R7303 Prediabetes: Secondary | ICD-10-CM | POA: Diagnosis present

## 2015-11-22 DIAGNOSIS — I420 Dilated cardiomyopathy: Secondary | ICD-10-CM | POA: Diagnosis present

## 2015-11-22 DIAGNOSIS — Z79899 Other long term (current) drug therapy: Secondary | ICD-10-CM | POA: Diagnosis not present

## 2015-11-22 DIAGNOSIS — I5023 Acute on chronic systolic (congestive) heart failure: Secondary | ICD-10-CM | POA: Diagnosis not present

## 2015-11-22 DIAGNOSIS — J189 Pneumonia, unspecified organism: Secondary | ICD-10-CM | POA: Diagnosis present

## 2015-11-22 DIAGNOSIS — R0602 Shortness of breath: Secondary | ICD-10-CM | POA: Diagnosis present

## 2015-11-22 DIAGNOSIS — N179 Acute kidney failure, unspecified: Secondary | ICD-10-CM | POA: Diagnosis present

## 2015-11-22 DIAGNOSIS — D62 Acute posthemorrhagic anemia: Secondary | ICD-10-CM | POA: Diagnosis not present

## 2015-11-22 DIAGNOSIS — I509 Heart failure, unspecified: Secondary | ICD-10-CM | POA: Diagnosis not present

## 2015-11-22 DIAGNOSIS — Z9689 Presence of other specified functional implants: Secondary | ICD-10-CM

## 2015-11-22 DIAGNOSIS — I255 Ischemic cardiomyopathy: Secondary | ICD-10-CM

## 2015-11-22 DIAGNOSIS — E785 Hyperlipidemia, unspecified: Secondary | ICD-10-CM | POA: Diagnosis present

## 2015-11-22 DIAGNOSIS — Z8546 Personal history of malignant neoplasm of prostate: Secondary | ICD-10-CM | POA: Diagnosis not present

## 2015-11-22 DIAGNOSIS — I5021 Acute systolic (congestive) heart failure: Secondary | ICD-10-CM

## 2015-11-22 DIAGNOSIS — R739 Hyperglycemia, unspecified: Secondary | ICD-10-CM | POA: Insufficient documentation

## 2015-11-22 DIAGNOSIS — Z01810 Encounter for preprocedural cardiovascular examination: Secondary | ICD-10-CM | POA: Diagnosis not present

## 2015-11-22 HISTORY — DX: Acute systolic (congestive) heart failure: I50.21

## 2015-11-22 HISTORY — DX: Atherosclerotic heart disease of native coronary artery without angina pectoris: I25.10

## 2015-11-22 HISTORY — PX: CARDIAC CATHETERIZATION: SHX172

## 2015-11-22 HISTORY — DX: Chronic diastolic (congestive) heart failure: I50.32

## 2015-11-22 LAB — CBC
HCT: 39.4 % — ABNORMAL LOW (ref 40.0–52.0)
HEMOGLOBIN: 13.2 g/dL (ref 13.0–18.0)
MCH: 31.5 pg (ref 26.0–34.0)
MCHC: 33.6 g/dL (ref 32.0–36.0)
MCV: 93.6 fL (ref 80.0–100.0)
Platelets: 140 10*3/uL — ABNORMAL LOW (ref 150–440)
RBC: 4.2 MIL/uL — AB (ref 4.40–5.90)
RDW: 16.2 % — ABNORMAL HIGH (ref 11.5–14.5)
WBC: 3.6 10*3/uL — ABNORMAL LOW (ref 3.8–10.6)

## 2015-11-22 LAB — BRAIN NATRIURETIC PEPTIDE: B Natriuretic Peptide: 1040 pg/mL — ABNORMAL HIGH (ref 0.0–100.0)

## 2015-11-22 LAB — BASIC METABOLIC PANEL
ANION GAP: 6 (ref 5–15)
BUN: 19 mg/dL (ref 6–20)
CHLORIDE: 103 mmol/L (ref 101–111)
CO2: 30 mmol/L (ref 22–32)
CREATININE: 0.96 mg/dL (ref 0.61–1.24)
Calcium: 8.8 mg/dL — ABNORMAL LOW (ref 8.9–10.3)
GFR calc non Af Amer: >60 mL/min (ref >60)
Glucose, Bld: 107 mg/dL — ABNORMAL HIGH (ref 65–99)
Potassium: 3.6 mmol/L (ref 3.5–5.1)
Sodium: 139 mmol/L (ref 135–145)

## 2015-11-22 LAB — HEMOGLOBIN A1C
HEMOGLOBIN A1C: 5.9 % — AB (ref 4.8–5.6)
MEAN PLASMA GLUCOSE: 123 mg/dL

## 2015-11-22 SURGERY — RIGHT/LEFT HEART CATH AND CORONARY ANGIOGRAPHY
Anesthesia: Moderate Sedation | Laterality: Right

## 2015-11-22 MED ORDER — ASPIRIN EC 81 MG PO TBEC: 81.0000 mg | DELAYED_RELEASE_TABLET | Freq: Every day | ORAL | 0 refills | Status: DC

## 2015-11-22 MED ORDER — ASPIRIN EC 81 MG PO TBEC
81.0000 mg | DELAYED_RELEASE_TABLET | Freq: Every day | ORAL | Status: DC
  Administered 2015-11-23 – 2015-11-28 (×6): 81 mg via ORAL
  Filled 2015-11-22 (×6): qty 1

## 2015-11-22 MED ORDER — IOPAMIDOL (ISOVUE-300) INJECTION 61%
INTRAVENOUS | Status: DC | PRN
  Administered 2015-11-22: 170 mL via INTRA_ARTERIAL

## 2015-11-22 MED ORDER — CARVEDILOL 3.125 MG PO TABS
3.1250 mg | ORAL_TABLET | Freq: Two times a day (BID) | ORAL | Status: DC
  Administered 2015-11-22 – 2015-11-25 (×5): 3.125 mg via ORAL
  Filled 2015-11-22 (×7): qty 1

## 2015-11-22 MED ORDER — ACETAMINOPHEN 325 MG PO TABS: 650.0000 mg | ORAL_TABLET | Freq: Four times a day (QID) | ORAL | 0 refills | Status: DC | PRN

## 2015-11-22 MED ORDER — SODIUM CHLORIDE 0.9 % WEIGHT BASED INFUSION: 1.0000 mL/kg/h | INTRAVENOUS | Status: DC

## 2015-11-22 MED ORDER — DOCUSATE SODIUM 100 MG PO CAPS
100.0000 mg | ORAL_CAPSULE | Freq: Two times a day (BID) | ORAL | Status: DC
  Administered 2015-11-22 – 2015-11-28 (×14): 100 mg via ORAL
  Filled 2015-11-22 (×14): qty 1

## 2015-11-22 MED ORDER — HEPARIN (PORCINE) IN NACL 100-0.45 UNIT/ML-% IJ SOLN
1200.0000 [IU]/h | INTRAMUSCULAR | Status: DC
  Administered 2015-11-22: 1400 [IU]/h via INTRAVENOUS
  Administered 2015-11-23 – 2015-11-24 (×2): 1650 [IU]/h via INTRAVENOUS
  Administered 2015-11-26: 900 [IU]/h via INTRAVENOUS
  Administered 2015-11-28: 1200 [IU]/h via INTRAVENOUS
  Filled 2015-11-22 (×7): qty 250

## 2015-11-22 MED ORDER — LEVOFLOXACIN 500 MG PO TABS
500.0000 mg | ORAL_TABLET | Freq: Every day | ORAL | Status: AC
  Administered 2015-11-22: 500 mg via ORAL
  Filled 2015-11-22: qty 1

## 2015-11-22 MED ORDER — FUROSEMIDE 10 MG/ML IJ SOLN
40.0000 mg | Freq: Two times a day (BID) | INTRAMUSCULAR | Status: DC
  Administered 2015-11-22 – 2015-11-26 (×7): 40 mg via INTRAVENOUS
  Filled 2015-11-22 (×8): qty 4

## 2015-11-22 MED ORDER — SODIUM CHLORIDE 0.9% FLUSH: 3.0000 mL | INTRAVENOUS | Status: DC | PRN

## 2015-11-22 MED ORDER — ATORVASTATIN CALCIUM 40 MG PO TABS: 40.0000 mg | ORAL_TABLET | Freq: Every day | ORAL | 0 refills | Status: DC

## 2015-11-22 MED ORDER — SODIUM CHLORIDE 0.9 % IV SOLN: 250.0000 mL | INTRAVENOUS | Status: DC | PRN

## 2015-11-22 MED ORDER — ACETAMINOPHEN 325 MG PO TABS: 650.0000 mg | ORAL_TABLET | ORAL | Status: DC | PRN

## 2015-11-22 MED ORDER — FENTANYL CITRATE (PF) 100 MCG/2ML IJ SOLN
INTRAMUSCULAR | Status: AC
  Filled 2015-11-22: qty 2

## 2015-11-22 MED ORDER — ENOXAPARIN SODIUM 40 MG/0.4ML ~~LOC~~ SOLN: 40.0000 mg | SUBCUTANEOUS | Status: DC

## 2015-11-22 MED ORDER — ATORVASTATIN CALCIUM 20 MG PO TABS: 40.0000 mg | ORAL_TABLET | Freq: Every day | ORAL | Status: DC

## 2015-11-22 MED ORDER — LEVOFLOXACIN 500 MG PO TABS: 500.0000 mg | ORAL_TABLET | Freq: Every day | ORAL | 0 refills | Status: DC

## 2015-11-22 MED ORDER — DOCUSATE SODIUM 100 MG PO CAPS: 100.0000 mg | ORAL_CAPSULE | Freq: Two times a day (BID) | ORAL | 0 refills | Status: DC

## 2015-11-22 MED ORDER — POTASSIUM CHLORIDE CRYS ER 20 MEQ PO TBCR
20.0000 meq | EXTENDED_RELEASE_TABLET | Freq: Every day | ORAL | 0 refills | Status: DC
Start: 2015-11-22 — End: 2015-11-22

## 2015-11-22 MED ORDER — SODIUM CHLORIDE 0.9% FLUSH
3.0000 mL | Freq: Two times a day (BID) | INTRAVENOUS | Status: DC
  Administered 2015-11-22 – 2015-11-28 (×13): 3 mL via INTRAVENOUS

## 2015-11-22 MED ORDER — MIDAZOLAM HCL 2 MG/2ML IJ SOLN
INTRAMUSCULAR | Status: AC
  Filled 2015-11-22: qty 2

## 2015-11-22 MED ORDER — POTASSIUM CHLORIDE CRYS ER 20 MEQ PO TBCR
20.0000 meq | EXTENDED_RELEASE_TABLET | Freq: Two times a day (BID) | ORAL | Status: DC
  Administered 2015-11-22 – 2015-11-25 (×6): 20 meq via ORAL
  Filled 2015-11-22 (×6): qty 1

## 2015-11-22 MED ORDER — MIDAZOLAM HCL 2 MG/2ML IJ SOLN
INTRAMUSCULAR | Status: DC | PRN
  Administered 2015-11-22: 1 mg via INTRAVENOUS

## 2015-11-22 MED ORDER — ONDANSETRON HCL 4 MG/2ML IJ SOLN: 4.0000 mg | Freq: Four times a day (QID) | INTRAMUSCULAR | Status: DC | PRN

## 2015-11-22 MED ORDER — FENTANYL CITRATE (PF) 100 MCG/2ML IJ SOLN
INTRAMUSCULAR | Status: DC | PRN
  Administered 2015-11-22: 12.5 ug via INTRAVENOUS

## 2015-11-22 MED ORDER — FINASTERIDE 5 MG PO TABS
5.0000 mg | ORAL_TABLET | Freq: Every day | ORAL | Status: DC
  Administered 2015-11-22 – 2015-12-05 (×13): 5 mg via ORAL
  Filled 2015-11-22 (×13): qty 1

## 2015-11-22 MED ORDER — ZOLPIDEM TARTRATE 5 MG PO TABS: 5.0000 mg | ORAL_TABLET | Freq: Every evening | ORAL | Status: DC | PRN

## 2015-11-22 MED ORDER — FUROSEMIDE 10 MG/ML IJ SOLN: 40.0000 mg | Freq: Two times a day (BID) | INTRAMUSCULAR | 0 refills | Status: DC

## 2015-11-22 MED ORDER — ATORVASTATIN CALCIUM 80 MG PO TABS
80.0000 mg | ORAL_TABLET | Freq: Every day | ORAL | Status: DC
  Administered 2015-11-22 – 2015-12-04 (×12): 80 mg via ORAL
  Filled 2015-11-22 (×12): qty 1

## 2015-11-22 MED ORDER — HEPARIN (PORCINE) IN NACL 2-0.9 UNIT/ML-% IJ SOLN
INTRAMUSCULAR | Status: AC
  Filled 2015-11-22: qty 500

## 2015-11-22 MED ORDER — LISINOPRIL 2.5 MG PO TABS
2.5000 mg | ORAL_TABLET | Freq: Every day | ORAL | Status: DC
  Administered 2015-11-22 – 2015-11-27 (×6): 2.5 mg via ORAL
  Filled 2015-11-22 (×6): qty 1

## 2015-11-22 SURGICAL SUPPLY — 14 items
CATH 5FR JL4 DIAGNOSTIC (CATHETERS) ×4 IMPLANT
CATH 5FR JR4 DIAGNOSTIC (CATHETERS) ×3 IMPLANT
CATH 5FR PIGTAIL DIAGNOSTIC (CATHETERS) ×3 IMPLANT
CATH SWANZ 7F THERMO (CATHETERS) ×4 IMPLANT
DEVICE CLOSURE MYNXGRIP 5F (Vascular Products) ×4 IMPLANT
DEVICE CLOSURE MYNXGRIP 6/7F (Vascular Products) ×4 IMPLANT
GUIDEWIRE EMER 3M J .025X150CM (WIRE) ×4 IMPLANT
KIT MANI 3VAL PERCEP (MISCELLANEOUS) ×4 IMPLANT
KIT RIGHT HEART (MISCELLANEOUS) ×4 IMPLANT
NEEDLE PERC 18GX7CM (NEEDLE) ×4 IMPLANT
PACK CARDIAC CATH (CUSTOM PROCEDURE TRAY) ×4 IMPLANT
SHEATH AVANTI 5FR X 11CM (SHEATH) ×4 IMPLANT
SHEATH PINNACLE 7F 10CM (SHEATH) ×4 IMPLANT
WIRE EMERALD 3MM-J .035X150CM (WIRE) ×4 IMPLANT

## 2015-11-22 NOTE — H&P (Signed)
History and Physical  Patient ID: Shane Kim MRN: [id number], DOB: 29-Jul-1945 Admit Date: (Not on file) Date of Encounter: 11/22/2015, 11:31 AM Primary Physician: No PCP Per Patient Primary Cardiologist: Dr. Rockey Situ, MD (new this admission)  Chief Complaint: SOB/LE swelling Reason for Admission: Acute systolic CHF/severe 3-vessel CAD by cardiac cath 11/22/15  HPI: 70 y.o. male with h/o prostate cancer s/p prior prostate cryoablation and HLD who presented to [facility] with a 3-4 week history of increased SOB and LE swelling.   He has no previously known cardiac history and has never seen a cardiologist before. No prior ischemic evaluations. Over the past 3-4 weeks he has noted increased SOB and nonproductive cough. He saw outside MD at that time and was diagnosed with PNA and placed on Levaquin. His SOB did not improve and this was followed by the development of LE swelling. He has had an 8 pound weight gain at time of admission from 235-->243 upon admission, though he was not aware of this until it was brought to his attention. Over this same 3-4 week time span he has had an increase in orthopnea, now using 2 pillows (previously 1 pillow). No early satiety. No decrease in functional capacity. Upon the patient's arrival to [facility] they were found to have BNP 1125, troponin peaked at 0.04, SCr 1.17, K+ 3.9, hgb 12.4, wbc 5.2, LDL 116, A1C 5.9%. ECG as below, CXR showed stable cardiomegaly with pulmonary vascular congestion. Trace right pleural effusion. Confluent edema, less likely early PNA. He was initially given PO Lasix 40 mg in the ED, followed by transiton to IV Lasix upon admission with UOP of 6.4 L as of 9/28 with stable renal function. Echo showed EF severely reduced at less than 20%, severe diffuse global HK with minimal regional variablity, GR3DD, moderate MR, LA mildly dilated, LV mildly dilated, RV systolic function mildly reduced, PASP elevated at 52 mmHg. He underwent R/LHC on 9/28 that showed  severe three-vessel coronary artery disease with severe proximal LAD, severe mid LCx, and occluded RCA. EF 20%, PCWP 29. See details below. It was recommended he be transferred to [facility] for evaluation of possible CABG after further diuresis.   Past Medical History:  Diagnosis Date  . Acute systolic CHF (congestive heart failure) ([place])    a. echo 11/21/15: EF <20%, mildly dilated LV, severe diffuse global HK, GR3DD, mod MR, LA mildly dilated, RV sys fxn mildly reduced, PASP 52 mmHg  . CAD (coronary artery disease)    a. cardiac cath 11/22/15: dLM 40%, pLAD 80-90%, mLAD sequential 40%, D2 90%, mLCx 90% at takeoff of OM1, RCA appeared occluded proximally, LVEF 20%, PCWP 29   . Chronic diastolic CHF (congestive heart failure) ([place])   . HLD (hyperlipidemia)   . Prostate cancer [facility])      Most Recent Cardiac Studies: Cardiac cath 11/22/2015: Procedural Findings:  Coronary angiography:  Coronary dominance: Left or codominant  Left mainstem: Large vessel that bifurcates into the LAD and left circumflex, 40% distal left main disease  Left anterior descending (LAD): Large vessel that extends to the apical region, diagonal branch 2 of moderate size, 80 to 90% proximal LAD disease, 40% sequential lesions in the mid vessel. Collateral to the RCA/PDA. Suspect high grade ostial D2 diseasem estimated at 90%  Left circumflex (LCx): Large vessel with OM branch 2, Mid LCX with 90% disease (at the take off of the OM 1). OM1 is a large branching vessel occluded in the ostial region, fills via collaterals, also with high  grade stenosis of superior branch, left to left (from septals and distal LAD)  Right coronary artery (RCA): Right dominant vessel with PL and PDA, poorly visualized (seen by nonselective root shot), appears occluded proximally, Large PDA/PL branches noted, filling via Left to right collaterals   Left ventriculography: Left ventricular systolic function is severely depressed, LVEF is estimated at  20%, there is no significant mitral regurgitation , no significant aortic valve stenosis. Anterior wall motion best preserved.  Final Conclusions:  Severe three vessel disease: Severe proximal LAD, occluded RCA, severe mid LCX Also with severe OM1 disease, diagonal #2 disease Cardiomyopathy, ischemic,EF 20% PA pressures elevated, PCWP 29  Recommendations:  Would recommend further diuresis Then consideration for CABG ASAP Will discuss with family, High risk of MI and arrhythmia, would recommend transfer to [facility] Will start heparin infusion later this evening  Echo 11/21/2015: Study Conclusions  - Left ventricle: The cavity size was mildly dilated. Wall   thickness was increased in a pattern of mild LVH. Systolic   function was severely reduced, less than 20%. There is severe   diffuse global hypokinesis with minimal regional variability.   Doppler parameters are consistent with a reversible restrictive   pattern, indicative of decreased left ventricular diastolic   compliance and/or increased left atrial pressure (grade 3   diastolic dysfunction). - Mitral valve: There was moderate regurgitation. - Left atrium: The atrium was mildly dilated. - Right ventricle: Systolic function was mildly reduced. - Pulmonary arteries: Systolic pressure was moderately increased.   PA peak pressure: 52 mm Hg (S).   Surgical History:  Past Surgical History:  Procedure Laterality Date  . TURP VAPORIZATION       Home Meds: Prior to Admission medications   Medication Sig Start Date End Date Taking? Authorizing Provider  finasteride (PROSCAR) 5 MG tablet Take 5 mg by mouth daily.    Historical Provider, MD  levofloxacin (LEVAQUIN) 500 MG tablet Take 1 tablet (500 mg total) by mouth daily. 11/15/15   Lorin Picket, PA-C    Allergies: No Known Allergies  Social History   Social History  . Marital status: Married    Spouse name: N/A  . Number of children: N/A  . Years of education: N/A    Occupational History  . Not on file.   Social History Main Topics  . Smoking status: Never Smoker  . Smokeless tobacco: Never Used  . Alcohol use No  . Drug use: No  . Sexual activity: Not on file   Other Topics Concern  . Not on file   Social History Narrative   Lives at home with his wife. Independent at baseline     Family History  Problem Relation Age of Onset  . Hypertension Mother   . Healthy Father     Review of Systems: Review of Systems  Constitutional: Positive for malaise/fatigue. Negative for chills, diaphoresis, fever and weight loss.  HENT: Negative for congestion.   Eyes: Negative for discharge and redness.  Respiratory: Positive for cough and shortness of breath. Negative for sputum production and wheezing.   Cardiovascular: Positive for orthopnea and leg swelling. Negative for chest pain, palpitations, claudication and PND.  Gastrointestinal: Negative for abdominal pain, blood in stool, heartburn, melena, nausea and vomiting.  Genitourinary: Negative for hematuria.  Musculoskeletal: Negative for falls and myalgias.  Skin: Negative for rash.  Neurological: Positive for weakness. Negative for dizziness, tingling, tremors, sensory change, speech change, focal weakness and loss of consciousness.  Endo/Heme/Allergies: Does not bruise/bleed easily.  Psychiatric/Behavioral: Negative for substance abuse. The patient is not nervous/anxious.   All other systems reviewed and are negative.   Labs:   Lab Results  Component Value Date   WBC 3.6 (L) 11/22/2015   HGB 13.2 11/22/2015   HCT 39.4 (L) 11/22/2015   MCV 93.6 11/22/2015   PLT 140 (L) 11/22/2015     Recent Labs Lab 11/22/15 0357  NA 139  K 3.6  CL 103  CO2 30  BUN 19  CREATININE 0.96  CALCIUM 8.8*  GLUCOSE 107*    Recent Labs  11/21/15 0816 11/21/15 1434 11/21/15 2018  TROPONINI 0.03* 0.03* 0.04*   Lab Results  Component Value Date   CHOL 177 11/21/2015   HDL 41 11/21/2015    LDLCALC 116 (H) 11/21/2015   TRIG 101 11/21/2015   No results found for: DDIMER  Radiology/Studies:  Dg Chest 2 View  Result Date: 11/21/2015 CLINICAL DATA:  Cough. Shortness of breath last week, now at baseline. History of prostate cancer. EXAM: CHEST  2 VIEW COMPARISON:  Chest radiograph November 15, 2015 FINDINGS: The cardiac silhouette is moderately enlarged, mediastinal silhouette is nonsuspicious. Mildly calcified aortic knob. Pulmonary vascular congestion. Small RIGHT pleural effusion. Hazy bibasilar airspace opacities. Small RIGHT pleural effusion. No pneumothorax. Soft tissue planes and included osseous structures are nonsuspicious. Moderate degenerative change of the thoracic spine. IMPRESSION: Stable cardiomegaly and pulmonary vascular congestion. Trace RIGHT pleural effusion. Bibasilar atelectasis, confluent edema, less likely early pneumonia. Electronically Signed   By: Elon Alas M.D.   On: 11/21/2015 04:29   Dg Chest 2 View  Result Date: 11/15/2015 CLINICAL DATA:  Cough for 2 months.  Shortness of breath for 2 weeks EXAM: CHEST  2 VIEW COMPARISON:  None. FINDINGS: There is no edema or consolidation. There is cardiomegaly with mild pulmonary venous hypertension. No adenopathy. There is degenerative change in the thoracic spine. IMPRESSION: There is a degree of pulmonary vascular congestion without frank edema or consolidation. Electronically Signed   By: Lowella Grip III M.D.   On: 11/15/2015 09:47     EKG: Interpreted by me showed: NSR, 93 bpm, nonspecific lateral st/t changes  Telemetry: Interpreted by me showed: NSR, 90's bpm  Weights: [employer]   11/21/15 0016 11/21/15 0807  Weight: 235 lb (106.6 kg) 243 lb 6.4 oz (110.4 kg)     Physical Exam: Blood pressure (!) 126/92, pulse (!) 103, temperature 98 F (36.7 C), temperature source Oral, resp. rate 18, height 6\' 1"  (1.854 m), weight 243 lb 6.4 oz (110.4 kg), SpO2 100 %. Body mass index is 32.11  kg/m. General: Well developed, well nourished, in no acute distress. Head: Normocephalic, atraumatic, sclera non-icteric, no xanthomas, nares are without discharge.  Neck: Negative for carotid bruits. JVD elevated 10 cm. Lungs: Decreased breath sounds bilaterally with diffuse crackles. Breathing is unlabored. Heart: RRR with S1 S2. II/VI systolic murmur, no rubs, or gallops appreciated. Abdomen: Soft, non-tender, non-distended with normoactive bowel sounds. No hepatomegaly. No rebound/guarding. No obvious abdominal masses. Msk:  Strength and tone appear normal for age. Extremities: No clubbing or cyanosis. 1+ pitting edema to the bilateral thighs. Distal pedal pulses are 2+ and equal bilaterally. Neuro: Alert and oriented X 3. No focal deficit. No facial asymmetry. Moves all extremities spontaneously. Psych:  Responds to questions appropriately with a normal affect.    ASSESSMENT AND PLAN:  Principal Problem:   Acute systolic CHF (congestive heart failure) (HCC) Active Problems:   Congestive dilated cardiomyopathy ([place])   CAD in native  artery   Hyperglycemia   HLD (hyperlipidemia)   1. Acute systolic and acute on chronic diastolic CHF/dilated cardiomyopathy: -He remains volume overloaded at this time requiring further IV diuresis prior to possible CABG -Continue IV Lasix 40 mg bid with KCl repletion  -No signs of decompensation, could start Coreg -Start ACEi with possible plans to change to ARB followed by Entresto if EF does not improve s/p revascularization  -Would benefit from spironolactone prior to discharge  -CHF education -Cardiac rehab -Echo as above -High risk of future arrhythmia   2. Newly found severe 3-vessel CAD: -Cardiac cath as above -Never with chest pain -Troponin peak of 0.04 -Transfer to [facility] for evaluation of possible CABG -Plan to start heparin gtt 8 hours s/p sheath pull (this evening) -Coreg and lisinopril as above -Lipitor  3. HLD: -Lipitor 80 mg  daily -Goal LDL < 70  4. Hyperglycemia: -Monitor -Needs outpatient follow up  5. History of prostate CA s/p cryoablation: -Continue finasteride   6. Suspected PNA: -Was continued on outpatient Levaquin by IM at [facility] -Will need one more dose to complete course   Signed, Marcille Blanco [place] Pager: [phone] 11/22/2015, 11:31 AM

## 2015-11-22 NOTE — Progress Notes (Signed)
ANTICOAGULATION CONSULT NOTE - Initial Consult  Pharmacy Consult for Heparin Indication: CAD  No Known Allergies  Patient Measurements: Height: 6\' 3"  (190.5 cm) Weight: 223 lb 8 oz (101.4 kg) IBW/kg (Calculated) : 84.5 Heparin Dosing Weight:  101.4 kg  Vital Signs: Temp: 98.6 F (37 C) (09/28 1407) Temp Source: Oral (09/28 1407) BP: 117/46 (09/28 1407) Pulse Rate: 105 (09/28 1407)  Labs:  Recent Labs  11/21/15 0020 11/21/15 0816 11/21/15 1434 11/21/15 2018 11/22/15 0357  HGB 12.4*  --   --   --  13.2  HCT 35.7*  --   --   --  39.4*  PLT 143*  --   --   --  140*  CREATININE 1.17  --   --   --  0.96  TROPONINI  --  0.03* 0.03* 0.04*  --     Estimated Creatinine Clearance: 89.8 mL/min (by C-G formula based on SCr of 0.96 mg/dL).   Medical History: Past Medical History:  Diagnosis Date  . Acute systolic CHF (congestive heart failure) ([place])    a. echo 11/21/15: EF <20%, mildly dilated LV, severe diffuse global HK, GR3DD, mod MR, LA mildly dilated, RV sys fxn mildly reduced, PASP 52 mmHg  . CAD (coronary artery disease)    a. cardiac cath 11/22/15: dLM 40%, pLAD 80-90%, mLAD sequential 40%, D2 90%, mLCx 90% at takeoff of OM1, RCA appeared occluded proximally, LVEF 20%, PCWP 29   . Chronic diastolic CHF (congestive heart failure) ([place])   . HLD (hyperlipidemia)   . Prostate cancer ([place])     Medications:  Prescriptions Prior to Admission  Medication Sig Dispense Refill Last Dose  . acetaminophen (TYLENOL) 325 MG tablet Take 2 tablets (650 mg total) by mouth every 6 (six) hours as needed for mild pain (or Fever >/= 101). 30 tablet 0   . aspirin EC 81 MG tablet Take 1 tablet (81 mg total) by mouth daily. 30 tablet 0   . atorvastatin (LIPITOR) 40 MG tablet Take 1 tablet (40 mg total) by mouth daily at 6 PM. 30 tablet 0   . docusate sodium (COLACE) 100 MG capsule Take 1 capsule (100 mg total) by mouth 2 (two) times daily. 10 capsule 0   . finasteride (PROSCAR) 5 MG tablet  Take 5 mg by mouth daily.   11/20/2015 at Unknown time  . furosemide (LASIX) 10 MG/ML injection Inject 4 mLs (40 mg total) into the vein 2 (two) times daily. 4 mL 0   . levofloxacin (LEVAQUIN) 500 MG tablet Take 1 tablet (500 mg total) by mouth daily. X 2 more days 2 tablet 0   . potassium chloride SA (K-DUR,KLOR-CON) 20 MEQ tablet Take 1 tablet (20 mEq total) by mouth daily. 30 tablet 0     Assessment: 70 y/o M transferred from [facility] (presented with new onset worsening SOB) for CVTS consult. BNP 1040 - 9/28 Cath: EF 20%, severe 3VCAD  Anticoag: IV heparin post cath for 3VCAD. Baseline CBC WNL. Watch platelets only 140  Cards: no previous cardiac history. BNP 1040 TC 177, TG 101, HDL 41, LDL 116   Goal of Therapy:  Heparin level 0.3-0.7 units/ml Monitor platelets by anticoagulation protocol: Yes   Plan:  At 9PM, start IV heparin 1400 units/hr Daily HL and CBC   Saidi Santacroce S. Alford Highland, PharmD, BCPS Clinical Staff Pharmacist Pager [phone]  Eilene Ghazi [place]illing[place] 11/22/2015,2:43 PM

## 2015-11-22 NOTE — Care Management (Signed)
cardiac cath revealed severe three vessel disease.  For transfer to [facility] for CABG

## 2015-11-22 NOTE — H&P (Signed)
Pt to be transferred to [facility] for surgery/ report called to [facility], RN/ verbalized an understanding/ Report given to [facility]/ will continue to monitor.

## 2015-11-22 NOTE — Discharge Summary (Addendum)
[facility] [place] NAME: Shane Kim    MR#:  [id number]  DATE OF BIRTH:  1945-09-22  DATE OF ADMISSION:  11/21/2015   ADMITTING PHYSICIAN: Gladstone Lighter, MD  DATE OF DISCHARGE: 11/22/15  PRIMARY CARE PHYSICIAN: No PCP Per Patient   ADMISSION DIAGNOSIS:   Acute congestive heart failure, unspecified congestive heart failure type (HCC) [I50.9]  DISCHARGE DIAGNOSIS:   Principal Problem:   Congestive heart failure (CHF) (HCC) Active Problems:   Prostate cancer (HCC)   AKI (acute kidney injury) ([place])   HLD (hyperlipidemia)   Congestive dilated cardiomyopathy (HCC)   Shortness of breath   Leg swelling   SECONDARY DIAGNOSIS:   Past Medical History:  Diagnosis Date  . Acute systolic CHF (congestive heart failure) ([place])   . CAD (coronary artery disease)    a. cardiac cath 11/22/15:  . Chronic diastolic CHF (congestive heart failure) ([place])   . HLD (hyperlipidemia)   . Prostate cancer [facility])     HOSPITAL COURSE:   Shane Kim  is a 70 y.o. male with a known history of Prostate cancer presents to hospital secondary to worsening shortness of breath.  #1 new onset CHF- new onset severe systolic CHF - EF [id number], likely ischemic - continue lasix IV, kcl supplements -Low sodium diet, daily weight checks. -Cardiology consulted. - for cardiac cath today  #2 CAD- s/p cath today done for ischemic cardiomyopathy, shows severe three vessel disease - will be transferred to [facility] for CABG - heparin drip can be started once he arrives there  #3 pneumonia recently started on Levaquin for pneumonia. Will need 2 more days to finish off the course.  #3 history of prostate cancer-stable. Continue Proscar  #4 Hyperlipidemia- start statin, LDL 116  Patient will be transferred to [place]:   [facility]  CONSULTS OBTAINED:   Treatment Team:  Minna Merritts, MD  DRUG ALLERGIES:   No  Known Allergies DISCHARGE MEDICATIONS:     Medication List    TAKE these medications   acetaminophen 325 MG tablet Commonly known as:  TYLENOL Take 2 tablets (650 mg total) by mouth every 6 (six) hours as needed for mild pain (or Fever >/= 101).   aspirin EC 81 MG tablet Take 1 tablet (81 mg total) by mouth daily.   atorvastatin 40 MG tablet Commonly known as:  LIPITOR Take 1 tablet (40 mg total) by mouth daily at 6 PM.   docusate sodium 100 MG capsule Commonly known as:  COLACE Take 1 capsule (100 mg total) by mouth 2 (two) times daily.   finasteride 5 MG tablet Commonly known as:  PROSCAR Take 5 mg by mouth daily.   furosemide 10 MG/ML injection Commonly known as:  LASIX Inject 4 mLs (40 mg total) into the vein 2 (two) times daily.   levofloxacin 500 MG tablet Commonly known as:  LEVAQUIN Take 1 tablet (500 mg total) by mouth daily. X 2 more days What changed:  additional instructions   potassium chloride SA 20 MEQ tablet Commonly known as:  K-DUR,KLOR-CON Take 1 tablet (20 mEq total) by mouth daily.        DISCHARGE INSTRUCTIONS:   1. Transfer to [facility]  DIET:   Cardiac diet  ACTIVITY:   Activity as tolerated  OXYGEN:   Home Oxygen: Yes.    Oxygen Delivery: 2 liters/min via Patient connected to nasal cannula oxygen  DISCHARGE LOCATION:   [facility]   If you experience worsening of your admission symptoms, develop shortness of breath, life threatening emergency, suicidal or homicidal thoughts you must seek medical attention immediately by calling 911 or calling your MD immediately  if symptoms less severe.  You Must read complete instructions/literature along with all the possible adverse reactions/side effects for all the Medicines you take and that have been prescribed to you. Take any new Medicines after you have completely understood and accpet all the possible adverse reactions/side effects.   Please note  You were  cared for by a hospitalist during your hospital stay. If you have any questions about your discharge medications or the care you received while you were in the hospital after you are discharged, you can call the unit and asked to speak with the hospitalist on call if the hospitalist that took care of you is not available. Once you are discharged, your primary care physician will handle any further medical issues. Please note that NO REFILLS for any discharge medications will be authorized once you are discharged, as it is imperative that you return to your primary care physician (or establish a relationship with a primary care physician if you do not have one) for your aftercare needs so that they can reassess your need for medications and monitor your lab values.    On the day of Discharge:  VITAL SIGNS:   Blood pressure 113/84, pulse 96, temperature 98.3 F (36.8 C), temperature source Oral, resp. rate 16, height 6\' 1"  (1.854 m), weight 110.4 kg (243 lb 6.4 oz), SpO2 98 %.  PHYSICAL EXAMINATION:    GENERAL:  70 y.o.-year-old patient lying in the bed with no acute distress.  EYES: Pupils equal, round, reactive to light and accommodation. No scleral icterus. Extraocular muscles intact.  HEENT: Head atraumatic, normocephalic. Oropharynx and nasopharynx clear.  NECK:  Supple, no jugular venous distention. No thyroid enlargement, no tenderness.  LUNGS: Normal breath sounds bilaterally, no wheezing, rhonchi or crepitation.Bibasilar crackles heard both sides posteriorly. No use of accessory muscles of respiration.  CARDIOVASCULAR: S1, S2 normal. No murmurs, rubs, or gallops.  ABDOMEN: Soft, nontender, nondistended. Bowel sounds present. No organomegaly or mass.  EXTREMITIES: No  cyanosis, or clubbing. 2+ pitting edema both lower extremities noted NEUROLOGIC: Cranial nerves II through XII are intact. Muscle strength 5/5 in all extremities. Sensation intact. Gait not checked.  PSYCHIATRIC: The patient  is alert and oriented x 3.  SKIN: No obvious rash, lesion, or ulcer.    DATA REVIEW:   CBC  Recent Labs Lab 11/22/15 0357  WBC 3.6*  HGB 13.2  HCT 39.4*  PLT 140*    Chemistries   Recent Labs Lab 11/21/15 1434 11/22/15 0357  NA  --  139  K  --  3.6  CL  --  103  CO2  --  30  GLUCOSE  --  107*  BUN  --  19  CREATININE  --  0.96  CALCIUM  --  8.8*  MG 2.0  --      Microbiology Results  No results found for this or any previous visit.  RADIOLOGY:  No results found.   Management plans discussed with the patient, family and they are in agreement.  CODE STATUS:     Code Status Orders        Start     Ordered   11/22/15 1047  Full code  Continuous     11/22/15 1046    Code Status History  Date Active Date Inactive Code Status Order ID Comments User Context   11/21/2015  8:11 AM 11/21/2015  8:34 AM Full Code ID:[id number]  Gladstone Lighter, MD Inpatient      TOTAL TIME TAKING CARE OF THIS PATIENT: 37 minutes.    Gladstone Lighter M.D on 11/22/2015 at 11:12 AM  Between 7am to 6pm - Pager - [phone]  After 6pm go to [email or web address] - [employer] [facility]  Office  [phone]  CC: Primary care physician; No PCP Per Patient   Note: This dictation was prepared with Dragon dictation along with smaller phrase technology. Any transcriptional errors that result from this process are unintentional.

## 2015-11-22 NOTE — Consult Note (Signed)
[address]       [place] [address]             [phone]      Cardiothoracic Surgery Consultation  Reason for Consult: Severe multi-vessel coronary artery disease with severe LV dysfunction Referring Physician: Dr. Newton Pigg Howard is an 70 y.o. male.  HPI:   The patient has a history of hyperlipidemia and prostate cancer s/p treatment 15 years ago who reports being in his usual good state of health until 3-4 weeks ago when he noticed increasing shortness of breath and non-productive cough. He was seen by an outside physician and diagnosed with pneumonia. He was treated with Levaquin but did not improve and subsequently developed progressive lower extremity swelling, an 8 lb weight gain, and 2 pillow orthopnea. He presented to [facility] and his BNP was 1125 with a peak troponin of 0.04. CXR showed cardiomegaly with pulmonary vascular congestion. He was diuresed with significant improvement in his edema and symptoms. An echo showed severe LV dysfunction with an EF < 20%, moderate MR, a dilated LV, mild RV systolic dysfunction, PASP elevated at 52 mm Hg. Cardiac cath today shows severe 3-vessel coronary artery disease with 90% proximal LAD, occlusion of a large OM with collaterals filling the distal vessel and an occluded proximal RCA with collaterals filling the PDA. LVEF was 20% with elevated pulmonary artery pressures and a PCWP of 29. He was transferred to [facility] for further care. He denies any prior cardiac history. He has had no chest pain or pressure. 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    Past Medical History:  Diagnosis Date  . Acute systolic CHF (congestive heart failure) ([place])    a. echo 11/21/15: EF <20%, mildly dilated LV, severe diffuse global HK, GR3DD, mod MR, LA mildly dilated, RV sys fxn mildly reduced, PASP 52 mmHg  . CAD (coronary artery disease)    a. cardiac cath 11/22/15: dLM 40%, pLAD 80-90%, mLAD sequential 40%, D2 90%, mLCx 90% at  takeoff of OM1, RCA appeared occluded proximally, LVEF 20%, PCWP 29   . Chronic diastolic CHF (congestive heart failure) ([place])   . HLD (hyperlipidemia)   . Prostate cancer [facility])     Past Surgical History:  Procedure Laterality Date  . CARDIAC CATHETERIZATION N/A 11/22/2015   Procedure: Right/Left Heart Cath and Coronary Angiography;  Surgeon: Minna Merritts, MD;  Location: [facility] [place] CV LAB;  Service: Cardiovascular;  Laterality: N/A;  . TURP VAPORIZATION      Family History  Problem Relation Age of Onset  . Hypertension Mother   . Healthy Father     Social History:  reports that he has never smoked. He has never used smokeless tobacco. He reports that he does not drink alcohol or use drugs.  Allergies: No Known Allergies  Medications:  I have reviewed the patient's current medications. Prior to Admission:  Prescriptions Prior to Admission  Medication Sig Dispense Refill Last Dose  . acetaminophen (TYLENOL) 325 MG tablet Take 2 tablets (650 mg total) by mouth every 6 (six) hours as needed for mild pain (or Fever >/= 101). 30 tablet 0 unkn  . atorvastatin (LIPITOR) 40 MG tablet Take 1 tablet (40 mg total) by mouth daily at 6 PM. 30 tablet 0 Past Week at Unknown time  . docusate sodium (COLACE) 100 MG capsule Take 1 capsule (100 mg total) by mouth 2 (two) times daily. (Patient taking differently: Take 100 mg by  mouth 2 (two) times daily as needed. ) 10 capsule 0 Past Week at Unknown time  . finasteride (PROSCAR) 5 MG tablet Take 5 mg by mouth daily.   Past Week at Unknown time  . levofloxacin (LEVAQUIN) 500 MG tablet Take 500 mg by mouth daily. 20 day supply  0 Past Week at Unknown time  . aspirin EC 81 MG tablet Take 1 tablet (81 mg total) by mouth daily. (Patient not taking: Reported on 11/22/2015) 30 tablet 0 Not Taking at Unknown time   Scheduled: . [START ON 11/23/2015] aspirin EC  81 mg Oral Daily  . atorvastatin  80 mg Oral q1800  . carvedilol  3.125 mg Oral BID WC  .  docusate sodium  100 mg Oral BID  . finasteride  5 mg Oral Daily  . furosemide  40 mg Intravenous BID  . lisinopril  2.5 mg Oral Daily  . potassium chloride  20 mEq Oral BID  . sodium chloride flush  3 mL Intravenous Q12H   Continuous: . heparin 1,400 Units/hr (11/22/15 2019)   GNF:AOZHYQ chloride, acetaminophen, ondansetron (ZOFRAN) IV, sodium chloride flush, zolpidem Anti-infectives    Start     Dose/Rate Route Frequency Ordered Stop   11/22/15 1430  levofloxacin (LEVAQUIN) tablet 500 mg     500 mg Oral Daily 11/22/15 1411 11/22/15 1456      Results for orders placed or performed during the hospital encounter of 11/21/15 (from the past 48 hour(s))  CBC     Status: Abnormal   Collection Time: 11/21/15 12:20 AM  Result Value Ref Range   WBC 5.2 3.8 - 10.6 K/uL   RBC 3.83 (L) 4.40 - 5.90 MIL/uL   Hemoglobin 12.4 (L) 13.0 - 18.0 g/dL   HCT 35.7 (L) 40.0 - 52.0 %   MCV 93.3 80.0 - 100.0 fL   MCH 32.3 26.0 - 34.0 pg   MCHC 34.7 32.0 - 36.0 g/dL   RDW 15.9 (H) 11.5 - 14.5 %   Platelets 143 (L) 150 - 440 K/uL  Basic metabolic panel     Status: Abnormal   Collection Time: 11/21/15 12:20 AM  Result Value Ref Range   Sodium 140 135 - 145 mmol/L   Potassium 3.9 3.5 - 5.1 mmol/L   Chloride 110 101 - 111 mmol/L   CO2 22 22 - 32 mmol/L   Glucose, Bld 127 (H) 65 - 99 mg/dL   BUN 15 6 - 20 mg/dL   Creatinine, Ser 1.17 0.61 - 1.24 mg/dL   Calcium 8.8 (L) 8.9 - 10.3 mg/dL   GFR calc non Af Amer >60 >60 mL/min   GFR calc Af Amer >60 >60 mL/min    Comment: (NOTE) The eGFR has been calculated using the CKD EPI equation. This calculation has not been validated in all clinical situations. eGFR's persistently <60 mL/min signify possible Chronic Kidney Disease.    Anion gap 8 5 - 15  Brain natriuretic peptide     Status: Abnormal   Collection Time: 11/21/15  2:20 AM  Result Value Ref Range   B Natriuretic Peptide 1,125.0 (H) 0.0 - 100.0 pg/mL  TSH     Status: None   Collection Time:  11/21/15  8:16 AM  Result Value Ref Range   TSH 1.386 0.350 - 4.500 uIU/mL  Troponin I     Status: Abnormal   Collection Time: 11/21/15  8:16 AM  Result Value Ref Range   Troponin I 0.03 (HH) <0.03 ng/mL    Comment: CRITICAL  RESULT CALLED TO, READ BACK BY AND VERIFIED WITH ASHLEY WILLIAMS 11/21/15 0952 SGD   Troponin I     Status: Abnormal   Collection Time: 11/21/15  2:34 PM  Result Value Ref Range   Troponin I 0.03 (HH) <0.03 ng/mL    Comment: CRITICAL VALUE NOTED. VALUE IS CONSISTENT WITH PREVIOUSLY REPORTED/CALLED VALUE  JUW   Hemoglobin A1c     Status: Abnormal   Collection Time: 11/21/15  2:34 PM  Result Value Ref Range   Hgb A1c MFr Bld 5.9 (H) 4.8 - 5.6 %    Comment: (NOTE)         Pre-diabetes: 5.7 - 6.4         Diabetes: >6.4         Glycemic control for adults with diabetes: <7.0    Mean Plasma Glucose 123 mg/dL    Comment: (NOTE) Performed At: [facility] [place], [place] [id number] Lindon Romp MD [id number]   Lipid panel     Status: Abnormal   Collection Time: 11/21/15  2:34 PM  Result Value Ref Range   Cholesterol 177 0 - 200 mg/dL   Triglycerides 101 <150 mg/dL   HDL 41 >40 mg/dL   Total CHOL/HDL Ratio 4.3 RATIO   VLDL 20 0 - 40 mg/dL   LDL Cholesterol 116 (H) 0 - 99 mg/dL    Comment:        Total Cholesterol/HDL:CHD Risk Coronary Heart Disease Risk Table                     Men   Women  1/2 Average Risk   3.4   3.3  Average Risk       5.0   4.4  2 X Average Risk   9.6   7.1  3 X Average Risk  23.4   11.0        Use the calculated Patient Ratio above and the CHD Risk Table to determine the patient's CHD Risk.        ATP III CLASSIFICATION (LDL):  <100     mg/dL   Optimal  100-129  mg/dL   Near or Above                    Optimal  130-159  mg/dL   Borderline  160-189  mg/dL   High  >190     mg/dL   Very High   Magnesium     Status: None   Collection Time: 11/21/15  2:34 PM  Result Value Ref Range   Magnesium  2.0 1.7 - 2.4 mg/dL  Troponin I     Status: Abnormal   Collection Time: 11/21/15  8:18 PM  Result Value Ref Range   Troponin I 0.04 (HH) <0.03 ng/mL    Comment: CRITICAL VALUE NOTED. VALUE IS CONSISTENT WITH PREVIOUSLY REPORTED/CALLED VALUE BY SGD/CAF   Basic metabolic panel     Status: Abnormal   Collection Time: 11/22/15  3:57 AM  Result Value Ref Range   Sodium 139 135 - 145 mmol/L   Potassium 3.6 3.5 - 5.1 mmol/L   Chloride 103 101 - 111 mmol/L   CO2 30 22 - 32 mmol/L   Glucose, Bld 107 (H) 65 - 99 mg/dL   BUN 19 6 - 20 mg/dL   Creatinine, Ser 0.96 0.61 - 1.24 mg/dL   Calcium 8.8 (L) 8.9 - 10.3 mg/dL   GFR calc non Af Amer >60 >60 mL/min  GFR calc Af Amer >60 >60 mL/min    Comment: (NOTE) The eGFR has been calculated using the CKD EPI equation. This calculation has not been validated in all clinical situations. eGFR's persistently <60 mL/min signify possible Chronic Kidney Disease.    Anion gap 6 5 - 15  CBC     Status: Abnormal   Collection Time: 11/22/15  3:57 AM  Result Value Ref Range   WBC 3.6 (L) 3.8 - 10.6 K/uL   RBC 4.20 (L) 4.40 - 5.90 MIL/uL   Hemoglobin 13.2 13.0 - 18.0 g/dL   HCT 39.4 (L) 40.0 - 52.0 %   MCV 93.6 80.0 - 100.0 fL   MCH 31.5 26.0 - 34.0 pg   MCHC 33.6 32.0 - 36.0 g/dL   RDW 16.2 (H) 11.5 - 14.5 %   Platelets 140 (L) 150 - 440 K/uL  Brain natriuretic peptide     Status: Abnormal   Collection Time: 11/22/15  3:57 AM  Result Value Ref Range   B Natriuretic Peptide 1,040.0 (H) 0.0 - 100.0 pg/mL    Dg Chest 2 View  Result Date: 11/21/2015 CLINICAL DATA:  Cough. Shortness of breath last week, now at baseline. History of prostate cancer. EXAM: CHEST  2 VIEW COMPARISON:  Chest radiograph November 15, 2015 FINDINGS: The cardiac silhouette is moderately enlarged, mediastinal silhouette is nonsuspicious. Mildly calcified aortic knob. Pulmonary vascular congestion. Small RIGHT pleural effusion. Hazy bibasilar airspace opacities. Small RIGHT pleural  effusion. No pneumothorax. Soft tissue planes and included osseous structures are nonsuspicious. Moderate degenerative change of the thoracic spine. IMPRESSION: Stable cardiomegaly and pulmonary vascular congestion. Trace RIGHT pleural effusion. Bibasilar atelectasis, confluent edema, less likely early pneumonia. Electronically Signed   By: Elon Alas M.D.   On: 11/21/2015 04:29    Review of Systems  Constitutional: Positive for malaise/fatigue. Negative for chills, diaphoresis, fever and weight loss.  HENT: Negative.   Eyes: Negative.   Respiratory: Positive for cough and shortness of breath.   Cardiovascular: Positive for orthopnea and leg swelling. Negative for chest pain, palpitations and PND.  Gastrointestinal: Negative.   Genitourinary: Negative.   Musculoskeletal: Negative.   Skin: Negative.   Neurological: Negative.   Endo/Heme/Allergies: Negative.   Psychiatric/Behavioral: Negative.    Blood pressure 111/78, pulse 99, temperature 98.1 F (36.7 C), temperature source Oral, resp. rate 18, height '6\' 3"'  (1.905 m), weight 101.4 kg (223 lb 8 oz), SpO2 98 %. Physical Exam  Constitutional: He is oriented to person, place, and time. He appears well-developed and well-nourished. No distress.  HENT:  Head: Normocephalic and atraumatic.  Mouth/Throat: Oropharynx is clear and moist.  Eyes: EOM are normal. Pupils are equal, round, and reactive to light.  Neck: Normal range of motion. Neck supple. No JVD present. No thyromegaly present.  Cardiovascular: Normal rate, regular rhythm, normal heart sounds and intact distal pulses.   No murmur heard. Respiratory: Effort normal and breath sounds normal. No respiratory distress. He has no rales.  GI: Soft. Bowel sounds are normal. He exhibits no distension and no mass. There is no tenderness.  Musculoskeletal: Normal range of motion. He exhibits edema.  Lymphadenopathy:    He has no cervical adenopathy.  Neurological: He is alert and  oriented to person, place, and time. He has normal strength. No cranial nerve deficit or sensory deficit.  Skin: Skin is warm and dry.  Psychiatric: He has a normal mood and affect.    [facility]*  [place], [place] [address]                            [phone]  ------------------------------------------------------------------- Transthoracic Echocardiography  Patient:    Olufemi, Mofield MR #:       [id number] Study Date: 11/21/2015 Gender:     M Age:        62 Height:     185.4 cm Weight:     110.4 kg BSA:        2.41 m^2 Pt. Status: Room:   ATTENDING    Gladstone Lighter  Ronaldo Miyamoto, Radhika  REFERRING    Tressia Miners, [place], [employer]  SONOGRAPHER  Mikki Santee  cc:  -------------------------------------------------------------------  ------------------------------------------------------------------- Indications:      CHF - 428.0.  ------------------------------------------------------------------- History:   Risk factors:  Dyslipidemia.  ------------------------------------------------------------------- Study Conclusions  - Left ventricle: The cavity size was mildly dilated. Wall   thickness was increased in a pattern of mild LVH. Systolic   function was severely reduced, less than 20%. There is severe   diffuse global hypokinesis with minimal regional variability.   Doppler parameters are consistent with a reversible restrictive   pattern, indicative of decreased left ventricular diastolic   compliance and/or increased left atrial pressure (grade 3   diastolic dysfunction). - Mitral valve: There was moderate regurgitation. - Left atrium: The atrium was mildly dilated. - Right ventricle: Systolic function was mildly reduced. - Pulmonary arteries: Systolic pressure was moderately increased.   PA peak pressure: 52 mm Hg  (S).  ------------------------------------------------------------------- Study data:  No prior study was available for comparison.  Study status:  Routine.  Procedure:  The patient reported no pain pre or post test. Transthoracic echocardiography. Image quality was adequate.  Study completion:  There were no complications. Transthoracic echocardiography.  M-mode, complete 2D, spectral Doppler, and color Doppler.  Birthdate:  Patient birthdate: 13-Aug-1945.  Age:  Patient is 70 yr old.  Sex:  Gender: male. BMI: 32.1 kg/m^2.  Blood pressure:     126/92  Patient status: Inpatient.  Study date:  Study date: 11/21/2015. Study time: 08:55 AM.  Location:  Echo laboratory.  -------------------------------------------------------------------  ------------------------------------------------------------------- Left ventricle:  The cavity size was mildly dilated. Wall thickness was increased in a pattern of mild LVH. Systolic function was severely reduced, less than 20%. There is severe diffuse global hypokinesis with minimal regional variability. Doppler parameters are consistent with a reversible restrictive pattern, indicative of decreased left ventricular diastolic compliance and/or increased left atrial pressure (grade 3 diastolic dysfunction).  ------------------------------------------------------------------- Aortic valve:   Trileaflet; mildly thickened leaflets. Cusp separation was normal.  Doppler:  Transvalvular velocity was within the normal range. There was no stenosis. There was no regurgitation.    Peak velocity ratio of LVOT to aortic valve: 0.62. Valve area (Vmax): 2.37 cm^2. Indexed valve area (Vmax): 0.98 cm^2/m^2.    Peak gradient (S): 6 mm Hg.  ------------------------------------------------------------------- Aorta:  Aortic root: The aortic root was normal in size.  ------------------------------------------------------------------- Mitral valve:   Structurally  normal valve.   Leaflet separation was normal.  Doppler:  Transvalvular velocity was within the normal range. There was no evidence for stenosis. There was moderate regurgitation.    Peak gradient (D): 5 mm Hg.  ------------------------------------------------------------------- Left atrium:  The atrium was mildly dilated.  ------------------------------------------------------------------- Right ventricle:  The cavity size was normal. Systolic function was mildly reduced.  ------------------------------------------------------------------- Pulmonic valve:   Poorly visualized.  Doppler:  There was no significant regurgitation.  ------------------------------------------------------------------- Tricuspid valve:   Structurally normal valve.   Leaflet separation was normal.  Doppler:  Transvalvular velocity was within the normal range. There was mild regurgitation.  ------------------------------------------------------------------- Pulmonary artery:   Systolic pressure was moderately increased.  ------------------------------------------------------------------- Right atrium:  The atrium was normal in size.  ------------------------------------------------------------------- Pericardium:  There was no pericardial effusion.  ------------------------------------------------------------------- Systemic veins: Inferior vena cava: The vessel was dilated. The respirophasic diameter changes were blunted (< 50%), consistent with elevated central venous pressure.  ------------------------------------------------------------------- Measurements   Left ventricle                            Value          Reference  LV ID, ED, PLAX chordal           (H)     56.1  mm       43 - 52  LV ID, ES, PLAX chordal           (H)     52    mm       23 - 38  LV fx shortening, PLAX chordal    (L)     7     %        >=29  LV PW thickness, ED                       11.3  mm       ---------   IVS/LV PW ratio, ED                       1.04           <=1.3  LV ejection fraction, 1-p A4C             29    %        ---------  LV end-diastolic volume, 2-p              194   ml       ---------  LV end-systolic volume, 2-p               157   ml       ---------  LV ejection fraction, 2-p                 19    %        ---------  Stroke volume, 2-p                        37    ml       ---------  LV end-diastolic volume/bsa, 2-p          80    ml/m^2   ---------  LV end-systolic volume/bsa, 2-p           65    ml/m^2   ---------  Stroke volume/bsa, 2-p                    15.3  ml/m^2   ---------  LV e&', lateral  8.52  cm/s     ---------  LV E/e&', lateral                          13.5           ---------    Ventricular septum                        Value          Reference  IVS thickness, ED                         11.8  mm       ---------    LVOT                                      Value          Reference  LVOT ID, S                                22    mm       ---------  LVOT area                                 3.8   cm^2     ---------  LVOT peak velocity, S                     74.9  cm/s     ---------    Aortic valve                              Value          Reference  Aortic valve peak velocity, S             120   cm/s     ---------  Aortic peak gradient, S                   6     mm Hg    ---------  Velocity ratio, peak, LVOT/AV             0.62           ---------  Aortic valve area, peak velocity          2.37  cm^2     ---------  Aortic valve area/bsa, peak               0.98  cm^2/m^2 ---------  velocity    Aorta                                     Value          Reference  Aortic root ID, ED                        30    mm       ---------    Left atrium  Value          Reference  LA ID, A-P, ES                            44    mm       ---------  LA ID/bsa, A-P                            1.82  cm/m^2    <=2.2  LA volume, ES, 1-p A4C                    81.3  ml       ---------  LA volume/bsa, ES, 1-p A4C                33.7  ml/m^2   ---------  LA volume, ES, 1-p A2C                    76.3  ml       ---------  LA volume/bsa, ES, 1-p A2C                31.6  ml/m^2   ---------    Mitral valve                              Value          Reference  Mitral E-wave peak velocity               115   cm/s     ---------  Mitral deceleration time                  169   ms       150 - 230  Mitral peak gradient, D                   5     mm Hg    ---------    Pulmonary arteries                        Value          Reference  PA pressure, S, DP                (H)     52    mm Hg    <=30    Tricuspid valve                           Value          Reference  Tricuspid regurg peak velocity            304   cm/s     ---------  Tricuspid peak RV-RA gradient             37    mm Hg    ---------    Systemic veins                            Value          Reference  Estimated CVP  15    mm Hg    ---------    Right ventricle                           Value          Reference  TAPSE                                     12.4  mm       ---------  RV pressure, S, DP                (H)     52    mm Hg    <=30  RV s&', lateral, S                         8.76  cm/s     ---------    Pulmonic valve                            Value          Reference  Pulmonic valve peak velocity, S           76.6  cm/s     ---------  Legend: (L)  and  (H)  mark values outside specified reference range.  ------------------------------------------------------------------- Prepared and Electronically Authenticated by  Wende Bushy, MD 2017-09-27T13:43:25  Physicians   Panel Physicians Referring Physician Case Authorizing Physician  Minna Merritts, MD (Primary)  Minna Merritts, MD  Procedures   Right/Left Heart Cath and Coronary Angiography  Conclusion    LM lesion, 40  %stenosed.  Prox RCA to Dist RCA lesion, 100 %stenosed.  Ost 2nd Mrg lesion, 100 %stenosed.  2nd Mrg lesion, 95 %stenosed.  Ost LAD to Prox LAD lesion, 90 %stenosed.  Ost 2nd Diag lesion, 90 %stenosed.  Mid LAD lesion, 40 %stenosed.  Dist LAD lesion, 40 %stenosed.  Prox Cx lesion, 90 %stenosed.  The left ventricular ejection fraction is less than 25% by visual estimate.  There is severe left ventricular systolic dysfunction.  Hemodynamic findings consistent with moderate pulmonary hypertension.    Procedural Details/Technique   Technical Details Cardiac Catheterization Procedure Note  Name: Kegan Mckeithan MRN: [id number] DOB: 1945-07-18  Procedure: Left Heart Cath, Selective Coronary Angiography, LV angiography  Indication: 70 yo male with worsening shortness of breath, leg swelling, PND and orthopnea over the past 2 months  shortness of breath when he exerts himself at work, lifting heavy items Reports coughing when he is trying to sleep in a supine position He reports recent worsening in his symptoms, presented to the emergency room  Denies any heavy alcohol intake, no prior smoking history, no diabetes He does report having secondhand smoke from his wife  Procedural details: The right groin was prepped, draped, and anesthetized with 1% lidocaine. Using modified Seldinger technique, a 5 French sheath was introduced into the right femoral artery and right femoral vein. Standard Judkins catheters (JL 5, JR 5 and pigtail catheter) and 7 french swan ganz catheter were used for coronary angiography, left ventriculography and measure right heart pressures. Catheter exchanges were performed over a guidewire. There were no immediate procedural complications. The patient was transferred to the post catheterization recovery area for further monitoring.  Moderate sedation: I was Face to Face with the patient during  this time: (code: [phone])   Procedural Findings:   Coronary  angiography:  Coronary dominance: Left or codominant  Left mainstem: Large vessel that bifurcates into the LAD and left circumflex, 40% distal left main disease  Left anterior descending (LAD): Large vessel that extends to the apical region, diagonal branch 2 of moderate size, 80 to 90% proximal LAD disease, 40% sequential lesions in the mid vessel. Collateral to the RCA/PDA. Suspect high grade ostial D2 diseasem estimated at 90%  Left circumflex (LCx): Large vessel with OM branch 2, Mid LCX with 90% disease (at the take off of the OM 1). OM1 is a large branching vessel occluded in the ostial region, fills via collaterals, also with high grade stenosis of superior branch, left to left (from septals and distal LAD)  Right coronary artery (RCA): Right dominant vessel with PL and PDA, poorly visualized (seen by nonselective root shot), appears occluded proximally, Large PDA/PL branches noted, filling via Left to right collaterals   Left ventriculography: Left ventricular systolic function is severely depressed, LVEF is estimated at 20%, there is no significant mitral regurgitation , no significant aortic valve stenosis. Anterior wall motion best preserved.  Final Conclusions:  Severe three vessel disease: Severe proximal LAD, occluded RCA, severe mid LCX Also with severe OM1 disease, diagonal #2 disease Cardiomyopathy, ischemic,EF 20% PA pressures elevated, PCWP 29  Recommendations:  Would recommend further diuresis Then consideration for CABG ASAP Will discuss with family, High risk of MI and arrhythmia, would recommend transfer to [facility] Will start heparin infusion later this evening  Recommend  Timothy Gollan 11/22/2015, 10:00 AM   Estimated blood loss <50 mL. . During this procedure the patient was administered the following to achieve and maintain moderate conscious sedation: Versed 1 mg, Fentanyl 12.5 mcg, while the patient's heart rate, blood pressure, and oxygen saturation were  continuously monitored. The period of conscious sedation was 52 minutes, of which I was present face-to-face 100% of this time.    Coronary Findings   Dominance: Co-dominant  Left Main  LM lesion, 40% stenosed.  Left Anterior Descending  Ost LAD to Prox LAD lesion, 90% stenosed.  Mid LAD lesion, 40% stenosed.  Dist LAD lesion, 40% stenosed.  Second Diagonal [employer] 2nd Diag lesion, 90% stenosed.  Left Circumflex  Prox Cx lesion, 90% stenosed.  Second Obtuse Marginal Branch  2nd Mrg filled by collaterals from Dist LAD.  Ost 2nd Mrg lesion, 100% stenosed.  2nd Mrg lesion, 95% stenosed.  Right Coronary Artery  Dist RCA filled by collaterals from Dist LAD.  Prox RCA to Dist RCA lesion, 100% stenosed.  Right Heart   Right Heart Pressures Hemodynamic findings consistent with moderate pulmonary hypertension. Elevated LV EDP consistent with volume overload.    Wall Motion              Left Heart   Left Ventricle The left ventricle is dilated. There is severe left ventricular systolic dysfunction. The left ventricular ejection fraction is less than 25% by visual estimate. There are LV function abnormalities due to global hypokinesis.    Coronary Diagrams   Diagnostic Diagram        Assessment/Plan:  I have personally reviewed and interpreted his echo and cath. He has severe multi-vessel coronary disease with severe LV dysfunction due to ischemic cardiomyopathy. His LVEF is about 20% with a dilated LV and moderate MR. He presented with symptoms of congestive heart failure with no chest pain. I think he would benefit from CABG to  prevent further LV dysfunction and hopefully improve his LV function some. His LAD is certainly graftable. His OM may be graftable distally but is likely intramyocardial and may not be graftable. The PDA is probably graftable. His operative risk is certainly increased due to his severe LV dysfunction and dilated LV. He does need to have complete  resolution of his heart failure prior to surgery to have the best outcome. I discussed the operative procedure with the patient and family including alternatives, benefits and risks; including but not limited to bleeding, blood transfusion, infection, stroke, myocardial infarction, graft failure, heart block requiring a permanent pacemaker, organ dysfunction, and death.  [facility] understands and agrees to proceed.  I would not be able to do surgery until next Thursday so cardiology will have to decide if he needs to stay in the hospital until then.   I spent 60 minutes performing this consultation and > 50% of this time was spent face to face counseling and coordinating the care of this patient's severe multi-vessel coronary artery disease.  Gaye Pollack 11/22/2015, 10:27 PM

## 2015-11-22 NOTE — Progress Notes (Signed)
Kerin Ransom, PA-C, has been notified that pt has arrived on [facility] from [facility].

## 2015-11-22 NOTE — Progress Notes (Signed)
Pt transferred from [facility]- see cath note and H&P from today (11/22/15). I have contacted [facility] for consult. Orders have been written by Christell Faith.   Kerin Ransom PA-C 11/22/2015 2:10 PM

## 2015-11-23 DIAGNOSIS — I25119 Atherosclerotic heart disease of native coronary artery with unspecified angina pectoris: Principal | ICD-10-CM

## 2015-11-23 DIAGNOSIS — I5021 Acute systolic (congestive) heart failure: Secondary | ICD-10-CM

## 2015-11-23 DIAGNOSIS — E785 Hyperlipidemia, unspecified: Secondary | ICD-10-CM

## 2015-11-23 LAB — CBC WITH DIFFERENTIAL/PLATELET
Basophils Absolute: 0 10*3/uL (ref 0.0–0.1)
Basophils Relative: 0 %
Eosinophils Absolute: 0.2 10*3/uL (ref 0.0–0.7)
Eosinophils Relative: 5 %
HCT: 42.8 % (ref 39.0–52.0)
Hemoglobin: 13.5 g/dL (ref 13.0–17.0)
Lymphocytes Relative: 32 %
Lymphs Abs: 1 10*3/uL (ref 0.7–4.0)
MCH: 30.8 pg (ref 26.0–34.0)
MCHC: 31.5 g/dL (ref 30.0–36.0)
MCV: 97.7 fL (ref 78.0–100.0)
Monocytes Absolute: 0.1 10*3/uL (ref 0.1–1.0)
Monocytes Relative: 5 %
Neutro Abs: 1.8 10*3/uL (ref 1.7–7.7)
Neutrophils Relative %: 58 %
Platelets: 169 10*3/uL (ref 150–400)
RBC: 4.38 MIL/uL (ref 4.22–5.81)
RDW: 15.7 % — ABNORMAL HIGH (ref 11.5–15.5)
WBC: 3.1 10*3/uL — ABNORMAL LOW (ref 4.0–10.5)

## 2015-11-23 LAB — BRAIN NATRIURETIC PEPTIDE: B Natriuretic Peptide: 712.6 pg/mL — ABNORMAL HIGH (ref 0.0–100.0)

## 2015-11-23 LAB — COMPREHENSIVE METABOLIC PANEL
ALT: 30 U/L (ref 17–63)
AST: 29 U/L (ref 15–41)
Albumin: 2.8 g/dL — ABNORMAL LOW (ref 3.5–5.0)
Alkaline Phosphatase: 61 U/L (ref 38–126)
Anion gap: 6 (ref 5–15)
BUN: 18 mg/dL (ref 6–20)
CO2: 32 mmol/L (ref 22–32)
Calcium: 9.2 mg/dL (ref 8.9–10.3)
Chloride: 99 mmol/L — ABNORMAL LOW (ref 101–111)
Creatinine, Ser: 1.15 mg/dL (ref 0.61–1.24)
GFR calc Af Amer: >60 mL/min (ref >60)
GFR calc non Af Amer: >60 mL/min (ref >60)
Glucose, Bld: 111 mg/dL — ABNORMAL HIGH (ref 65–99)
Potassium: 3.7 mmol/L (ref 3.5–5.1)
Sodium: 137 mmol/L (ref 135–145)
Total Bilirubin: 1.1 mg/dL (ref 0.3–1.2)
Total Protein: 6.8 g/dL (ref 6.5–8.1)

## 2015-11-23 LAB — HEPARIN LEVEL (UNFRACTIONATED)
HEPARIN UNFRACTIONATED: 0.43 [IU]/mL (ref 0.30–0.70)
Heparin Unfractionated: 0.25 IU/mL — ABNORMAL LOW (ref 0.30–0.70)

## 2015-11-23 LAB — PROTIME-INR
INR: 1.15
Prothrombin Time: 14.8 seconds (ref 11.4–15.2)

## 2015-11-23 LAB — APTT: aPTT: 59 seconds — ABNORMAL HIGH (ref 24–36)

## 2015-11-23 LAB — MAGNESIUM: Magnesium: 2.2 mg/dL (ref 1.7–2.4)

## 2015-11-23 NOTE — Progress Notes (Signed)
Nutrition Education Note  RD consulted for nutrition education regarding new onset CHF.  RD provided "Heart Failure Nutrition Therapy" handout from the Academy of [employer] and [employer]etics. Reviewed patient's dietary recall. Provided examples on ways to decrease sodium intake in diet. Discouraged intake of processed foods and use of salt shaker. Encouraged fresh fruits and vegetables as well as whole grain sources of carbohydrates to maximize fiber intake.   RD discussed why it is important for patient to adhere to diet recommendations, and emphasized the role of fluids, foods to avoid, and importance of weighing self daily. Pt reports drinking a lot of water. RD discussed thirst-quenching tips. Teach back method used. Pt reports that he will stop adding salt to his food, eating less bacon, and start eating dinners cooked at home rather than he out.   Expect good compliance.  Body mass index is 27.32 kg/m. Pt meets criteria for Overweight based on current BMI.  Current diet order is NPO. Pt reports having a good appetite and eating well. Labs and medications reviewed. No further nutrition interventions warranted at this time. RD contact information provided. If additional nutrition issues arise, please re-consult RD.   Scarlette Ar RD, CSP, LDN Inpatient Clinical Dietitian Pager: [phone] After Hours Pager: [phone]

## 2015-11-23 NOTE — Progress Notes (Signed)
ANTICOAGULATION CONSULT NOTE Pharmacy Consult for Heparin Indication: CAD awaiting CABG  No Known Allergies  Patient Measurements: Height: 6\' 3"  (190.5 cm) Weight: 223 lb 8 oz (101.4 kg) IBW/kg (Calculated) : 84.5 Heparin Dosing Weight:  101.4 kg  Vital Signs: Temp: 98.1 F (36.7 C) (09/28 2100) Temp Source: Oral (09/28 2100) BP: 111/78 (09/28 2100) Pulse Rate: 99 (09/28 2100)  Labs:  Recent Labs  11/21/15 0020 11/21/15 0816 11/21/15 1434 11/21/15 2018 11/22/15 0357 11/23/15 0351  HGB 12.4*  --   --   --  13.2 13.5  HCT 35.7*  --   --   --  39.4* 42.8  PLT 143*  --   --   --  140* 169  APTT  --   --   --   --   --  59*  LABPROT  --   --   --   --   --  14.8  INR  --   --   --   --   --  1.15  HEPARINUNFRC  --   --   --   --   --  0.25*  CREATININE 1.17  --   --   --  0.96 1.15  TROPONINI  --  0.03* 0.03* 0.04*  --   --     Estimated Creatinine Clearance: 75 mL/min (by C-G formula based on SCr of 1.15 mg/dL).   Assessment: 70 y.o. male with CAD s/p cath, awaiting CABG, for heparin  Goal of Therapy:  Heparin level 0.3-0.7 units/ml Monitor platelets by anticoagulation protocol: Yes   Plan:  Increase Heparin 1650 units/hr Check heparin level in 8 hours.   Caryl Pina 11/23/2015,5:00 AM

## 2015-11-23 NOTE — Progress Notes (Signed)
Patient Name: Shane Kim Date of Encounter: 11/23/2015  Primary Cardiologist: Rockey Situ [facility] Problem List     Principal Problem:   Coronary artery disease involving native coronary artery of native heart with angina pectoris [facility]) Active Problems:   Acute systolic CHF (congestive heart failure) (HCC)   HLD (hyperlipidemia)   Shortness of breath   Hyperglycemia    Subjective   Feels better today - breathing & edema improved. Still notes some cough & Orthopnea.  NO CP. NO bleeding.  Inpatient Medications    . aspirin EC  81 mg Oral Daily  . atorvastatin  80 mg Oral q1800  . carvedilol  3.125 mg Oral BID WC  . docusate sodium  100 mg Oral BID  . finasteride  5 mg Oral Daily  . furosemide  40 mg Intravenous BID  . lisinopril  2.5 mg Oral Daily  . potassium chloride  20 mEq Oral BID  . sodium chloride flush  3 mL Intravenous Q12H    Vital Signs    Vitals:   11/22/15 2100 11/23/15 0556 11/23/15 0903 11/23/15 1215  BP: 111/78 109/74 110/74 95/68  Pulse: 99 88 84 86  Resp: 18 18 18 18   Temp: 98.1 F (36.7 C) 97.7 F (36.5 C) 97.8 F (36.6 C) 97.6 F (36.4 C)  TempSrc: Oral Oral Oral Oral  SpO2: 98% 98% 98% 97%  Weight:  99.2 kg (218 lb 9.6 oz)    Height:        Intake/Output Summary (Last 24 hours) at 11/23/15 1221 Last data filed at 11/23/15 0952  Gross per 24 hour  Intake            332.4 ml  Output             4000 ml  Net          -3667.6 ml   Filed Weights   11/22/15 1415 11/23/15 0556  Weight: 101.4 kg (223 lb 8 oz) 99.2 kg (218 lb 9.6 oz)    Physical Exam   GEN: Well nourished, well developed, in no acute distress.  HEENT: Grossly normal.  Neck: Supple, + JVD ~11-12 cmH20, carotid bruits, or masses. Cardiac: RRR, no murmurs, rubs, or + S4. No clubbing, cyanosis, ~tr to 1+ edema.  Radials/DP/PT 2+ and equal bilaterally.  Respiratory:  Respirations regular and unlabored; bibasilar rales noted - clears some with cough. GI:  Soft, nontender, nondistended, BS + x 4. MS: no deformity or atrophy. Skin: warm and dry, no rash. Neuro:  Strength and sensation are intact. Psych: AAOx3.  Normal affect.  Labs    CBC  Recent Labs  11/22/15 0357 11/23/15 0351  WBC 3.6* 3.1*  NEUTROABS  --  1.8  HGB 13.2 13.5  HCT 39.4* 42.8  MCV 93.6 97.7  PLT 140* [id number]   Basic Metabolic Panel  Recent Labs  11/21/15 1434 11/22/15 0357 11/23/15 0351  NA  --  139 137  K  --  3.6 3.7  CL  --  103 99*  CO2  --  30 32  GLUCOSE  --  107* 111*  BUN  --  19 18  CREATININE  --  0.96 1.15  CALCIUM  --  8.8* 9.2  MG 2.0  --  2.2   Liver Function Tests  Recent Labs  11/23/15 0351  AST 29  ALT 30  ALKPHOS 61  BILITOT 1.1  PROT 6.8  ALBUMIN 2.8*   No results for input(s): LIPASE, AMYLASE in  the last 72 hours. Cardiac Enzymes  Recent Labs  11/21/15 0816 11/21/15 1434 11/21/15 2018  TROPONINI 0.03* 0.03* 0.04*   BNP Invalid input(s): POCBNP D-Dimer No results for input(s): DDIMER in the last 72 hours. Hemoglobin A1C  Recent Labs  11/21/15 1434  HGBA1C 5.9*   Fasting Lipid Panel  Recent Labs  11/21/15 1434  CHOL 177  HDL 41  LDLCALC 116*  TRIG 101  CHOLHDL 4.3   Thyroid Function Tests  Recent Labs  11/21/15 0816  TSH 1.386    Telemetry    nsr 80s  ECG    n/a  Cardiology    Echo 9/27: mild LVH, EF ~20% - Severe global HK with minimal global variability.  Gr 3 DD, Mod MR.  Mod P HTN - PAP ~ 50-55 mmHg.   RLCP:  LM lesion, 40 %stenosed.  Prox RCA to Dist RCA lesion, 100 %stenosed.  Ost LAD to Prox LAD lesion, 90 %stenosed. Mid LAD lesion, 40 %stenosed. Dist LAD lesion, 40 %stenosed.  Ost 2nd Diag lesion, 90 %stenosed.  Prox Cx lesion, 90 %stenosed.  Ost 2nd Mrg lesion, 100 %stenosed. 2nd Mrg lesion, 95 %stenosed.   There is severe left ventricular systolic dysfunction. The left ventricular ejection fraction is less than 25% by visual estimate.  LVEDP 28  mmHg  Hemodynamic findings consistent with moderate pulmonary hypertension. 51/16/11 mmHg RVP, PAP 57/33/41 mmHg (in setting of SBP 98/72 mmHg;   Diagnostic Diagram        Radiology    No results found.  Patient Profile     70 y.o. male with h/o prostate cancer s/p prior prostate cryoablation and HLD who presented to [facility] with a 3-4 week history of increased SOB and LE swelling.   He has no previously known cardiac history and has never seen a cardiologist before.  Initially admitted to [facility] on 9/27 for SSx of Acute CHF - SOB/DOE with severe edema. --> Echo with EF ~20%, Cath with MV CAD.  Transferred to [facility] for CT SGx c/s --> CABG.  Has been on IV Lasix for diuresis.  Assessment & Plan    Principal Problem:   Coronary artery disease involving native coronary artery of native heart with angina pectoris [facility]): Multivessel CAD on CAth - transferred for [facility] for CABG -- seen by Dr.Bartle yesterday - tentative plan is for next Thursday.  On IV Heparin + ASA  ON BB, ACE-I & statin  No active angina -- would prefer essentially bedrest with bathroom priviledges    Acute combined systolic and diastolic CHF (congestive heart failure) (HCC) - EF ~~20% by Echo.  feels much better with aggressive diuresis (net out ~3.6L), still has ~JVD & basal rales.  BNP still ~700  Continue IV Lasix today - reassess clinically in AM to determine how much longer he needs IV  ON Low dose Coreg & Lisinopril  - cannot titrate further 2/2 borderline BP    HLD (hyperlipidemia) - started on High dose Atorvastatin    Shortness of breath - related to #1 & 2    Hyperglycemia - A1c 5.9 -- monitor glucose levels.  No Rx for now  ~ AKI resolved.   Signed, Glenetta Hew, M.D., M.S. Interventional Cardiologist   Pager # [phone] Phone # [phone] [address] [place], [place] [address]   11/23/2015, 12:21 PM

## 2015-11-23 NOTE — Progress Notes (Signed)
ANTICOAGULATION CONSULT NOTE - Follow Up Consult  Pharmacy Consult for Heparin Indication: CAD awaiting CABG  No Known Allergies  Patient Measurements: Height: 6\' 3"  (190.5 cm) Weight: 218 lb 9.6 oz (99.2 kg) (scale a) IBW/kg (Calculated) : 84.5 Heparin Dosing Weight: 99 kg  Vital Signs: Temp: 97.6 F (36.4 C) (09/29 1215) Temp Source: Oral (09/29 1215) BP: 95/68 (09/29 1215) Pulse Rate: 86 (09/29 1215)  Labs:  Recent Labs  11/21/15 0020 11/21/15 0816 11/21/15 1434 11/21/15 2018 11/22/15 0357 11/23/15 0351 11/23/15 1314  HGB 12.4*  --   --   --  13.2 13.5  --   HCT 35.7*  --   --   --  39.4* 42.8  --   PLT 143*  --   --   --  140* 169  --   APTT  --   --   --   --   --  59*  --   LABPROT  --   --   --   --   --  14.8  --   INR  --   --   --   --   --  1.15  --   HEPARINUNFRC  --   --   --   --   --  0.25* 0.43  CREATININE 1.17  --   --   --  0.96 1.15  --   TROPONINI  --  0.03* 0.03* 0.04*  --   --   --     Estimated Creatinine Clearance: 69.4 mL/min (by C-G formula based on SCr of 1.15 mg/dL).  Assessment:  3V CAD, for CABG on 10/5.  Heparin level is now therapeutic (0.43) on 1650 units/hr, after rate increase early this morning.  Goal of Therapy:  Heparin level 0.3-0.7 units/ml Monitor platelets by anticoagulation protocol: Yes   Plan:   Continue heparin drip at 1650 units/hr.  Daily heparin level and CBC.  Arty Baumgartner, [place] Pager: [phone] 11/23/2015,3:01 PM

## 2015-11-23 NOTE — Progress Notes (Signed)
Pt did not walk today due to Dr instructions (prefer bedrest).  Discussed pre op ed with pt and spouse.  Offered to start pre op video but pt denied.  Pt and wife verbalized understanding.  Will continue to follow.  [id number]  Cleda Mccreedy, MS 11/15/2015 15:01

## 2015-11-24 ENCOUNTER — Inpatient Hospital Stay (HOSPITAL_COMMUNITY): Payer: BLUE CROSS/BLUE SHIELD

## 2015-11-24 DIAGNOSIS — Z01810 Encounter for preprocedural cardiovascular examination: Secondary | ICD-10-CM

## 2015-11-24 LAB — CBC
HCT: 46 % (ref 39.0–52.0)
Hemoglobin: 14.6 g/dL (ref 13.0–17.0)
MCH: 31.1 pg (ref 26.0–34.0)
MCHC: 31.7 g/dL (ref 30.0–36.0)
MCV: 97.9 fL (ref 78.0–100.0)
PLATELETS: 194 10*3/uL (ref 150–400)
RBC: 4.7 MIL/uL (ref 4.22–5.81)
RDW: 15.5 % (ref 11.5–15.5)
WBC: 3.1 10*3/uL — AB (ref 4.0–10.5)

## 2015-11-24 LAB — VAS US DOPPLER PRE CABG
LCCAPDIAS: 15 cm/s
LCCAPSYS: 85 cm/s
LEFT ECA DIAS: -3 cm/s
LEFT VERTEBRAL DIAS: -6 cm/s
LICAPDIAS: -16 cm/s
Left CCA dist dias: -15 cm/s
Left CCA dist sys: -83 cm/s
Left ICA dist dias: -20 cm/s
Left ICA dist sys: -57 cm/s
Left ICA prox sys: -48 cm/s
RCCADSYS: -38 cm/s
RCCAPDIAS: 7 cm/s
RIGHT ECA DIAS: -6 cm/s
RIGHT VERTEBRAL DIAS: 9 cm/s
Right CCA prox sys: 63 cm/s

## 2015-11-24 LAB — HEPARIN LEVEL (UNFRACTIONATED): Heparin Unfractionated: 0.51 IU/mL (ref 0.30–0.70)

## 2015-11-24 NOTE — Progress Notes (Signed)
ANTICOAGULATION CONSULT NOTE - Follow Up Consult  Pharmacy Consult for Heparin Indication: CAD awaiting CABG  No Known Allergies  Patient Measurements: Height: 6\' 3"  (190.5 cm) Weight: 214 lb 14.4 oz (97.5 kg) (scale a) IBW/kg (Calculated) : 84.5 Heparin Dosing Weight: 99 kg  Vital Signs: Temp: 97.4 F (36.3 C) (09/30 0409) Temp Source: Oral (09/30 0409) BP: 100/70 (09/30 0409) Pulse Rate: 88 (09/30 0409)  Labs:  Recent Labs  11/21/15 1434 11/21/15 2018  11/22/15 0357 11/23/15 0351 11/23/15 1314 11/24/15 0255  HGB  --   --   < > 13.2 13.5  --  14.6  HCT  --   --   --  39.4* 42.8  --  46.0  PLT  --   --   --  140* 169  --  194  APTT  --   --   --   --  59*  --   --   LABPROT  --   --   --   --  14.8  --   --   INR  --   --   --   --  1.15  --   --   HEPARINUNFRC  --   --   --   --  0.25* 0.43 0.51  CREATININE  --   --   --  0.96 1.15  --   --   TROPONINI 0.03* 0.04*  --   --   --   --   --   < > = values in this interval not displayed.  Estimated Creatinine Clearance: 69.4 mL/min (by C-G formula based on SCr of 1.15 mg/dL).  Assessment: V CAD, for CABG on 10/7.  Heparin level remains therapeutic this am at 0.51 on heparin infusion at 1650 units/hr. Hgb and platelets remain stable.    Goal of Therapy:  Heparin level 0.3-0.7 units/ml Monitor platelets by anticoagulation protocol: Yes    Plan:  1.  Continue heparin drip at 1650 units/hr. 2.  Daily heparin level and CBC.   Vincenza Hews, PharmD, BCPS 11/24/2015, 10:44 AM Pager: [phone]

## 2015-11-24 NOTE — Progress Notes (Signed)
Patient back from vascular, alert and oriented denies pain. Will continue to monitor patient.

## 2015-11-24 NOTE — Progress Notes (Signed)
Patient Name: Shane Kim Date of Encounter: 11/24/2015  Hospital Problem List     Principal Problem:   Coronary artery disease involving native coronary artery of native heart with angina pectoris ([place]) Active Problems:   HLD (hyperlipidemia)   Shortness of breath   Acute systolic CHF (congestive heart failure) ([place])   Hyperglycemia    Patient Profile     70 y.o. male with h/o prostate cancer s/p prior prostate cryoablation and HLD who presented to [facility] with a 3-4 week history of increased SOB and LE swelling. He underwent R/LHC on 9/28 that showed severe three-vessel coronary artery disease with severe proximal LAD, severe mid LCx, and occluded RCA. EF 20%, PCWP 29. See details below. It was recommended he be transferred to [facility] for evaluation of possible CABG after further diuresis.   Subjective   Denies pain or SOB.    Inpatient Medications    . aspirin EC  81 mg Oral Daily  . atorvastatin  80 mg Oral q1800  . carvedilol  3.125 mg Oral BID WC  . docusate sodium  100 mg Oral BID  . finasteride  5 mg Oral Daily  . furosemide  40 mg Intravenous BID  . lisinopril  2.5 mg Oral Daily  . potassium chloride  20 mEq Oral BID  . sodium chloride flush  3 mL Intravenous Q12H    Vital Signs    Vitals:   11/23/15 1950 11/23/15 2100 11/24/15 0018 11/24/15 0409  BP: (!) 88/64 91/67 103/73 100/70  Pulse: 94  91 88  Resp: 18  18 20   Temp: 98.4 F (36.9 C)  98.6 F (37 C) 97.4 F (36.3 C)  TempSrc: Oral  Oral Oral  SpO2: 97%  99% 96%  Weight:    214 lb 14.4 oz (97.5 kg)  Height:        Intake/Output Summary (Last 24 hours) at 11/24/15 0946 Last data filed at 11/24/15 0700  Gross per 24 hour  Intake          1344.12 ml  Output             2000 ml  Net          -655.88 ml   Filed Weights   11/22/15 1415 11/23/15 0556 11/24/15 0409  Weight: 223 lb 8 oz (101.4 kg) 218 lb 9.6 oz (99.2 kg) 214 lb 14.4 oz (97.5 kg)    Physical Exam    GEN: Well nourished, well  developed, in no no acute distress.  Neck: Supple, no JVD, carotid bruits, or masses. Cardiac: RRR, no rubs, or gallops. No clubbing, cyanosis, no edema.  Radials/DP/PT 2+ and equal bilaterally.  Respiratory:  Respirations  regular and unlabored, bibasilar crackles.  Marland Kitchen GI: Soft, nontender, nondistended, BS + x 4. Neuro:  Strength and sensation are intact.   Labs    CBC  Recent Labs  11/23/15 0351 11/24/15 0255  WBC 3.1* 3.1*  NEUTROABS 1.8  --   HGB 13.5 14.6  HCT 42.8 46.0  MCV 97.7 97.9  PLT 169 [id number]   Basic Metabolic Panel  Recent Labs  11/21/15 1434 11/22/15 0357 11/23/15 0351  NA  --  139 137  K  --  3.6 3.7  CL  --  103 99*  CO2  --  30 32  GLUCOSE  --  107* 111*  BUN  --  19 18  CREATININE  --  0.96 1.15  CALCIUM  --  8.8* 9.2  MG 2.0  --  2.2   Liver Function Tests  Recent Labs  11/23/15 0351  AST 29  ALT 30  ALKPHOS 61  BILITOT 1.1  PROT 6.8  ALBUMIN 2.8*   No results for input(s): LIPASE, AMYLASE in the last 72 hours. Cardiac Enzymes  Recent Labs  11/21/15 1434 11/21/15 2018  TROPONINI 0.03* 0.04*   BNP Invalid input(s): POCBNP D-Dimer No results for input(s): DDIMER in the last 72 hours. Hemoglobin A1C  Recent Labs  11/21/15 1434  HGBA1C 5.9*   Fasting Lipid Panel  Recent Labs  11/21/15 1434  CHOL 177  HDL 41  LDLCALC 116*  TRIG 101  CHOLHDL 4.3   Thyroid Function Tests No results for input(s): TSH, T4TOTAL, T3FREE, THYROIDAB in the last 72 hours.  Invalid input(s): FREET3  Telemetry    NSR with PVCs  ECG    NA  Radiology    Dg Chest 2 View  Result Date: 11/21/2015 CLINICAL DATA:  Cough. Shortness of breath last week, now at baseline. History of prostate cancer. EXAM: CHEST  2 VIEW COMPARISON:  Chest radiograph November 15, 2015 FINDINGS: The cardiac silhouette is moderately enlarged, mediastinal silhouette is nonsuspicious. Mildly calcified aortic knob. Pulmonary vascular congestion. Small RIGHT pleural  effusion. Hazy bibasilar airspace opacities. Small RIGHT pleural effusion. No pneumothorax. Soft tissue planes and included osseous structures are nonsuspicious. Moderate degenerative change of the thoracic spine. IMPRESSION: Stable cardiomegaly and pulmonary vascular congestion. Trace RIGHT pleural effusion. Bibasilar atelectasis, confluent edema, less likely early pneumonia. Electronically Signed   By: Elon Alas M.D.   On: 11/21/2015 04:29   Dg Chest 2 View  Result Date: 11/15/2015 CLINICAL DATA:  Cough for 2 months.  Shortness of breath for 2 weeks EXAM: CHEST  2 VIEW COMPARISON:  None. FINDINGS: There is no edema or consolidation. There is cardiomegaly with mild pulmonary venous hypertension. No adenopathy. There is degenerative change in the thoracic spine. IMPRESSION: There is a degree of pulmonary vascular congestion without frank edema or consolidation. Electronically Signed   By: Lowella Grip III M.D.   On: 11/15/2015 09:47    Assessment & Plan    ISCHEMIC CARDIOMYOPATHY/ACUTE COMBINED SYSTOLIC AND DIASTOLIC HF:  Continue low dose Coreg and lisinopril.  Continue IV Lasix today.  Check BMET in the AM.   CAD:   CABG tentatively Thursday.   Signed, Minus Breeding, MD  11/24/2015, 9:46 AM

## 2015-11-24 NOTE — Progress Notes (Signed)
Pre-op Cardiac Surgery  Carotid Findings:  Bilateral - 1% to 39% ICA stenosis. Vertebral artery flow is antegrade.  Upper Extremity Right Left  Brachial Pressures 84 Triphasic 87 Triphasic  Radial Waveforms Triphasic Triphasic  Ulnar Waveforms Triphasic Triphasic  Palmar Arch (Allen's Test) Normal Normal   Findings:  Doppler waveforms remained normal bilaterally with both radial and ulnar compressions.    Lower  Extremity Right Left  Dorsalis Pedis 100 Triphasic 106 Triphasic  Posterior Tibial 105 Triphasic 94 Triphasic  Ankle/Brachial Indices 1.21 1.22    Findings:  ABIs and Doppler waveforms indicate normal arterial flow bilaterally at rest.   Toma Copier, [place] 11/24/15

## 2015-11-24 NOTE — Progress Notes (Signed)
    Called by RN bc BP low. 80/60. Will hold coreg and lasix tonight. He is asymptomatic   Angelena Form PA-C  [facility]

## 2015-11-24 NOTE — Progress Notes (Signed)
Patient alert and oriented, denies pain, no shortness of breath. Will continue to monitor the patient.

## 2015-11-25 LAB — HEPARIN LEVEL (UNFRACTIONATED)
HEPARIN UNFRACTIONATED: 0.94 [IU]/mL — AB (ref 0.30–0.70)
HEPARIN UNFRACTIONATED: 0.87 [IU]/mL — AB (ref 0.30–0.70)
HEPARIN UNFRACTIONATED: 0.74 [IU]/mL — AB (ref 0.30–0.70)

## 2015-11-25 LAB — CBC
HEMATOCRIT: 45.5 % (ref 39.0–52.0)
HEMOGLOBIN: 14.4 g/dL (ref 13.0–17.0)
MCH: 31.3 pg (ref 26.0–34.0)
MCHC: 31.6 g/dL (ref 30.0–36.0)
MCV: 98.9 fL (ref 78.0–100.0)
Platelets: 179 10*3/uL (ref 150–400)
RBC: 4.6 MIL/uL (ref 4.22–5.81)
RDW: 15.8 % — AB (ref 11.5–15.5)
WBC: 3.3 10*3/uL — AB (ref 4.0–10.5)

## 2015-11-25 LAB — BASIC METABOLIC PANEL
ANION GAP: 10 (ref 5–15)
BUN: 23 mg/dL — ABNORMAL HIGH (ref 6–20)
CALCIUM: 9.1 mg/dL (ref 8.9–10.3)
CHLORIDE: 102 mmol/L (ref 101–111)
CO2: 25 mmol/L (ref 22–32)
CREATININE: 1.12 mg/dL (ref 0.61–1.24)
GFR calc non Af Amer: >60 mL/min (ref >60)
Glucose, Bld: 82 mg/dL (ref 65–99)
Potassium: 5.1 mmol/L (ref 3.5–5.1)
SODIUM: 137 mmol/L (ref 135–145)

## 2015-11-25 MED ORDER — POTASSIUM CHLORIDE CRYS ER 20 MEQ PO TBCR
20.0000 meq | EXTENDED_RELEASE_TABLET | Freq: Every day | ORAL | Status: DC
  Administered 2015-11-26 – 2015-11-28 (×3): 20 meq via ORAL
  Filled 2015-11-25 (×3): qty 1

## 2015-11-25 NOTE — Progress Notes (Signed)
ANTICOAGULATION CONSULT NOTE - Follow Up Consult  Pharmacy Consult for Heparin  Indication: CAD awaiting CABG  No Known Allergies  Patient Measurements: Height: 6\' 3"  (190.5 cm) Weight: 218 lb 12.8 oz (99.2 kg) IBW/kg (Calculated) : 84.5  Vital Signs: Temp: 98.1 F (36.7 C) (10/01 0451) Temp Source: Oral (10/01 0451) BP: 92/54 (10/01 0451) Pulse Rate: 88 (10/01 0451)  Labs:  Recent Labs  11/23/15 0351 11/23/15 1314 11/24/15 0255 11/25/15 0339  HGB 13.5  --  14.6 14.4  HCT 42.8  --  46.0 45.5  PLT 169  --  194 179  APTT 59*  --   --   --   LABPROT 14.8  --   --   --   INR 1.15  --   --   --   HEPARINUNFRC 0.25* 0.43 0.51 0.94*  CREATININE 1.15  --   --   --      Assessment: CAD awaiting CABG, plan for CABG this coming Thursday, heparin level is supra-therapeutic this AM, no issues per RN.  Goal of Therapy:  Heparin level 0.3-0.7 units/ml Monitor platelets by anticoagulation protocol: Yes   Plan:  -Decrease heparin to 1450 units/hr -1330 HL  Vinayak Bobier 11/25/2015,5:33 AM

## 2015-11-25 NOTE — Progress Notes (Signed)
ANTICOAGULATION CONSULT NOTE - Follow Up Consult  Pharmacy Consult for Heparin  Indication: CAD awaiting CABG  No Known Allergies  Patient Measurements: Height: 6\' 3"  (190.5 cm) Weight: 218 lb 12.8 oz (99.2 kg) IBW/kg (Calculated) : 84.5  Vital Signs: Temp: 98 F (36.7 C) (10/01 2029) Temp Source: Oral (10/01 2029) BP: 96/62 (10/01 2029) Pulse Rate: 95 (10/01 2029)  Labs:  Recent Labs  11/23/15 0351  11/24/15 0255 11/25/15 0339 11/25/15 1409 11/25/15 2219  HGB 13.5  --  14.6 14.4  --   --   HCT 42.8  --  46.0 45.5  --   --   PLT 169  --  194 179  --   --   APTT 59*  --   --   --   --   --   LABPROT 14.8  --   --   --   --   --   INR 1.15  --   --   --   --   --   HEPARINUNFRC 0.25*  < > 0.51 0.94* 0.87* 0.74*  CREATININE 1.15  --   --  1.12  --   --   < > = values in this interval not displayed.   Assessment: CAD awaiting CABG, plan for CABG this coming Thursday, heparin level is supra-therapeutic tonight but trending down after multiple rate decreases  Goal of Therapy:  Heparin level 0.3-0.7 units/ml Monitor platelets by anticoagulation protocol: Yes   Plan:  -Decrease heparin to 1100 units/hr -0800 HL  Narda Bonds 11/25/2015,11:47 PM

## 2015-11-25 NOTE — Progress Notes (Signed)
ANTICOAGULATION CONSULT NOTE - Follow Up Consult  Pharmacy Consult for Heparin  Indication: CAD awaiting CABG  No Known Allergies  Patient Measurements: Height: 6\' 3"  (190.5 cm) Weight: 218 lb 12.8 oz (99.2 kg) IBW/kg (Calculated) : 84.5  Vital Signs: Temp: 97.8 F (36.6 C) (10/01 1230) Temp Source: Oral (10/01 1230) BP: 88/64 (10/01 1230) Pulse Rate: 94 (10/01 1230)  Labs:  Recent Labs  11/23/15 0351  11/24/15 0255 11/25/15 0339 11/25/15 1409  HGB 13.5  --  14.6 14.4  --   HCT 42.8  --  46.0 45.5  --   PLT 169  --  194 179  --   APTT 59*  --   --   --   --   LABPROT 14.8  --   --   --   --   INR 1.15  --   --   --   --   HEPARINUNFRC 0.25*  < > 0.51 0.94* 0.87*  CREATININE 1.15  --   --  1.12  --   < > = values in this interval not displayed.   Assessment: CAD awaiting CABG, plan for CABG this coming Thursday.  Heparin level remains supratherapeutic despite rate decrease earlier today  No issues with infusion per RN.  Goal of Therapy:  Heparin level 0.3-0.7 units/ml Monitor platelets by anticoagulation protocol: Yes   Plan:  - Decrease heparin to 1250 units/hr - 2300 HL  Vincenza Hews, PharmD, BCPS 11/25/2015, 3:39 PM Pager: [phone]

## 2015-11-25 NOTE — Progress Notes (Signed)
Patient Name: Shane Kim Date of Encounter: 11/25/2015  Hospital Problem List     Principal Problem:   Coronary artery disease involving native coronary artery of native heart with angina pectoris ([place]) Active Problems:   HLD (hyperlipidemia)   Shortness of breath   Acute systolic CHF (congestive heart failure) ([place])   Hyperglycemia    Patient Profile     70 y.o. male with h/o prostate cancer s/p prior prostate cryoablation and HLD who presented to [facility] with a 3-4 week history of increased SOB and LE swelling. He underwent R/LHC on 9/28 that showed severe three-vessel coronary artery disease with severe proximal LAD, severe mid LCx, and occluded RCA. EF 20%, PCWP 29. See details below. It was recommended he be transferred to [facility] for evaluation of possible CABG after further diuresis.   Subjective   BP was low yesterday.  Held Coreg and Lasix.   He has had a cough since last night although he slept well.   He denies any acute symptoms except cough.   Inpatient Medications    . aspirin EC  81 mg Oral Daily  . atorvastatin  80 mg Oral q1800  . carvedilol  3.125 mg Oral BID WC  . docusate sodium  100 mg Oral BID  . finasteride  5 mg Oral Daily  . furosemide  40 mg Intravenous BID  . lisinopril  2.5 mg Oral Daily  . potassium chloride  20 mEq Oral BID  . sodium chloride flush  3 mL Intravenous Q12H    Vital Signs    Vitals:   11/24/15 1040 11/24/15 1558 11/24/15 2022 11/25/15 0451  BP: 94/61 (!) 82/60 95/68 (!) 92/54  Pulse:   88 88  Resp: 18  18 18   Temp: 97.8 F (36.6 C)  97.5 F (36.4 C) 98.1 F (36.7 C)  TempSrc: Oral  Oral Oral  SpO2: 99%  98% 93%  Weight:    218 lb 12.8 oz (99.2 kg)  Height:        Intake/Output Summary (Last 24 hours) at 11/25/15 0739 Last data filed at 11/24/15 2200  Gross per 24 hour  Intake           1141.5 ml  Output              825 ml  Net            316.5 ml   Filed Weights   11/23/15 0556 11/24/15 0409 11/25/15 0451    Weight: 218 lb 9.6 oz (99.2 kg) 214 lb 14.4 oz (97.5 kg) 218 lb 12.8 oz (99.2 kg)    Physical Exam    GEN: Well nourished, well developed, in no no acute distress.  Neck: Supple, no JVD, carotid bruits, or masses. Cardiac: RRR, no rubs, or gallops. No clubbing, cyanosis, no edema.  Radials/DP/PT 2+ and equal bilaterally.  Respiratory:  Respirations  regular and unlabored, bibasilar crackles and mild exp wheezing. Marland Kitchen GI: Soft, nontender, nondistended, BS + x 4. Neuro:  Strength and sensation are intact.   Labs    CBC  Recent Labs  11/23/15 0351 11/24/15 0255 11/25/15 0339  WBC 3.1* 3.1* 3.3*  NEUTROABS 1.8  --   --   HGB 13.5 14.6 14.4  HCT 42.8 46.0 45.5  MCV 97.7 97.9 98.9  PLT 169 194 0000000   Basic Metabolic Panel  Recent Labs  11/23/15 0351 11/25/15 0339  NA 137 137  K 3.7 5.1  CL 99* 102  CO2 32 25  GLUCOSE 111* 82  BUN 18 23*  CREATININE 1.15 1.12  CALCIUM 9.2 9.1  MG 2.2  --    Liver Function Tests  Recent Labs  11/23/15 0351  AST 29  ALT 30  ALKPHOS 61  BILITOT 1.1  PROT 6.8  ALBUMIN 2.8*   No results for input(s): LIPASE, AMYLASE in the last 72 hours. Cardiac Enzymes No results for input(s): CKTOTAL, CKMB, CKMBINDEX, TROPONINI in the last 72 hours. BNP Invalid input(s): POCBNP D-Dimer No results for input(s): DDIMER in the last 72 hours. Hemoglobin A1C No results for input(s): HGBA1C in the last 72 hours. Fasting Lipid Panel No results for input(s): CHOL, HDL, LDLCALC, TRIG, CHOLHDL, LDLDIRECT in the last 72 hours. Thyroid Function Tests No results for input(s): TSH, T4TOTAL, T3FREE, THYROIDAB in the last 72 hours.  Invalid input(s): FREET3  Telemetry    NSR with PVCs  ECG    NA  Radiology    Dg Chest 2 View  Result Date: 11/21/2015 CLINICAL DATA:  Cough. Shortness of breath last week, now at baseline. History of prostate cancer. EXAM: CHEST  2 VIEW COMPARISON:  Chest radiograph November 15, 2015 FINDINGS: The cardiac  silhouette is moderately enlarged, mediastinal silhouette is nonsuspicious. Mildly calcified aortic knob. Pulmonary vascular congestion. Small RIGHT pleural effusion. Hazy bibasilar airspace opacities. Small RIGHT pleural effusion. No pneumothorax. Soft tissue planes and included osseous structures are nonsuspicious. Moderate degenerative change of the thoracic spine. IMPRESSION: Stable cardiomegaly and pulmonary vascular congestion. Trace RIGHT pleural effusion. Bibasilar atelectasis, confluent edema, less likely early pneumonia. Electronically Signed   By: Elon Alas M.D.   On: 11/21/2015 04:29   Dg Chest 2 View  Result Date: 11/15/2015 CLINICAL DATA:  Cough for 2 months.  Shortness of breath for 2 weeks EXAM: CHEST  2 VIEW COMPARISON:  None. FINDINGS: There is no edema or consolidation. There is cardiomegaly with mild pulmonary venous hypertension. No adenopathy. There is degenerative change in the thoracic spine. IMPRESSION: There is a degree of pulmonary vascular congestion without frank edema or consolidation. Electronically Signed   By: Lowella Grip III M.D.   On: 11/15/2015 09:47    Assessment & Plan    ISCHEMIC CARDIOMYOPATHY/ACUTE COMBINED SYSTOLIC AND DIASTOLIC HF:  (EF is 123456) He had his dose of beta blocker held.  I will stop that as his BP is low.  I will however, give him IV Lasix today. I will give him his ACE inhibitor.   He has some crackles and wheezing with cough.  COUGH:  CXR on 9/27 suggested vascular congestion but did not exclude pneumonia.  He has no fevers and chills.  However, order PA/LAT CXR tomorrow if his cough and decreased lung sounds continues.   PFTs have been ordered.    CAD:   CABG tentatively Thursday.   Signed, Minus Breeding, MD  11/25/2015, 7:39 AM

## 2015-11-26 ENCOUNTER — Inpatient Hospital Stay (HOSPITAL_COMMUNITY): Payer: BLUE CROSS/BLUE SHIELD

## 2015-11-26 DIAGNOSIS — I2510 Atherosclerotic heart disease of native coronary artery without angina pectoris: Secondary | ICD-10-CM

## 2015-11-26 LAB — PULMONARY FUNCTION TEST
DL/VA % pred: 92 %
DL/VA: 4.32 ml/min/mmHg/L
DLCO COR % PRED: 59 %
DLCO COR: 20.09 ml/min/mmHg
DLCO UNC % PRED: 59 %
DLCO UNC: 20.09 ml/min/mmHg
FEF 25-75 POST: 2.83 L/s
FEF 25-75 PRE: 3.34 L/s
FEF2575-%Change-Post: -15 %
FEF2575-%PRED-POST: 113 %
FEF2575-%PRED-PRE: 134 %
FEV1-%Change-Post: -8 %
FEV1-%PRED-POST: 73 %
FEV1-%Pred-Pre: 80 %
FEV1-PRE: 2.37 L
FEV1-Post: 2.18 L
FEV1FVC-%Change-Post: -7 %
FEV1FVC-%PRED-PRE: 120 %
FEV6-%CHANGE-POST: 0 %
FEV6-%PRED-POST: 68 %
FEV6-%Pred-Pre: 68 %
FEV6-POST: 2.56 L
FEV6-Pre: 2.56 L
FEV6FVC-%PRED-POST: 105 %
FEV6FVC-%Pred-Pre: 105 %
FVC-%Change-Post: 0 %
FVC-%Pred-Post: 65 %
FVC-%Pred-Pre: 65 %
FVC-Post: 2.58 L
FVC-Pre: 2.6 L
PRE FEV1/FVC RATIO: 91 %
Post FEV1/FVC ratio: 84 %
Post FEV6/FVC ratio: 100 %
Pre FEV6/FVC Ratio: 100 %
RV % PRED: 83 %
RV: 2.13 L
TLC % pred: 64 %
TLC: 4.64 L

## 2015-11-26 LAB — BASIC METABOLIC PANEL
ANION GAP: 9 (ref 5–15)
BUN: 20 mg/dL (ref 6–20)
CALCIUM: 9.6 mg/dL (ref 8.9–10.3)
CO2: 30 mmol/L (ref 22–32)
Chloride: 100 mmol/L — ABNORMAL LOW (ref 101–111)
Creatinine, Ser: 1.32 mg/dL — ABNORMAL HIGH (ref 0.61–1.24)
GFR calc Af Amer: >60 mL/min (ref >60)
GFR, EST NON AFRICAN AMERICAN: 52 mL/min — AB (ref >60)
GLUCOSE: 91 mg/dL (ref 65–99)
Potassium: 4.3 mmol/L (ref 3.5–5.1)
Sodium: 139 mmol/L (ref 135–145)

## 2015-11-26 LAB — CBC
HEMATOCRIT: 46.1 % (ref 39.0–52.0)
Hemoglobin: 14.6 g/dL (ref 13.0–17.0)
MCH: 31 pg (ref 26.0–34.0)
MCHC: 31.7 g/dL (ref 30.0–36.0)
MCV: 97.9 fL (ref 78.0–100.0)
PLATELETS: 221 10*3/uL (ref 150–400)
RBC: 4.71 MIL/uL (ref 4.22–5.81)
RDW: 15.3 % (ref 11.5–15.5)
WBC: 3.2 10*3/uL — ABNORMAL LOW (ref 4.0–10.5)

## 2015-11-26 LAB — HEPARIN LEVEL (UNFRACTIONATED)
HEPARIN UNFRACTIONATED: 0.4 [IU]/mL (ref 0.30–0.70)
Heparin Unfractionated: 0.98 IU/mL — ABNORMAL HIGH (ref 0.30–0.70)

## 2015-11-26 MED ORDER — FUROSEMIDE 40 MG PO TABS
40.0000 mg | ORAL_TABLET | Freq: Every day | ORAL | Status: DC
  Administered 2015-11-27 – 2015-11-28 (×2): 40 mg via ORAL
  Filled 2015-11-26 (×2): qty 1

## 2015-11-26 MED ORDER — HEPARIN (PORCINE) IN NACL 100-0.45 UNIT/ML-% IJ SOLN
INTRAMUSCULAR | Status: AC
  Administered 2015-11-26: 900 [IU]/h via INTRAVENOUS
  Filled 2015-11-26: qty 250

## 2015-11-26 MED ORDER — ALBUTEROL SULFATE (2.5 MG/3ML) 0.083% IN NEBU
2.5000 mg | INHALATION_SOLUTION | Freq: Once | RESPIRATORY_TRACT | Status: AC
  Administered 2015-11-26: 2.5 mg via RESPIRATORY_TRACT

## 2015-11-26 NOTE — Progress Notes (Signed)
ANTICOAGULATION CONSULT NOTE - Follow Up Consult  Pharmacy Consult for Heparin Indication: CAD awaiting CABG  No Known Allergies  Patient Measurements: Height: 6\' 3"  (190.5 cm) Weight: 214 lb 9.6 oz (97.3 kg) (Scale A) IBW/kg (Calculated) : 84.5 Heparin Dosing Weight: 97 kg  Vital Signs: Temp: 97.2 F (36.2 C) (10/02 0447) Temp Source: Oral (10/02 0447) BP: 96/64 (10/02 0447) Pulse Rate: 86 (10/02 0447)  Labs:  Recent Labs  11/24/15 0255 11/25/15 0339 11/25/15 1409 11/25/15 2219 11/26/15 0415 11/26/15 0616  HGB 14.6 14.4  --   --  14.6  --   HCT 46.0 45.5  --   --  46.1  --   PLT 194 179  --   --  221  --   HEPARINUNFRC 0.51 0.94* 0.87* 0.74*  --  0.98*  CREATININE  --  1.12  --   --   --   --     Estimated Creatinine Clearance: 71.3 mL/min (by C-G formula based on SCr of 1.12 mg/dL).  Assessment:  3V CAD, for CABG on 10/5.  Heparin level remains supratherapeutic (0.98) on 1100 units/hr.  Level increased after infusion rate decreased from 1250 units/hr ~6 hrs earlier.  No infusion problems. Lab sample drawn from opposite arm.  No bleeding noted.  CBC stable.  Goal of Therapy:  Heparin level 0.3-0.7 units/ml Monitor platelets by anticoagulation protocol: Yes   Plan:   Decrease heparin drip to 900 units/hr.  Next heparin level ~6 hrs after rate change.  Daily heparin level and CBC.  Arty Baumgartner, [place] Pager: [phone] 11/26/2015,9:35 AM

## 2015-11-26 NOTE — Progress Notes (Signed)
ANTICOAGULATION CONSULT NOTE - Follow Up Consult  Pharmacy Consult for Heparin Indication: CAD awaiting CABG  No Known Allergies  Patient Measurements: Height: 6\' 3"  (190.5 cm) Weight: 214 lb 9.6 oz (97.3 kg) (Scale A) IBW/kg (Calculated) : 84.5 Heparin Dosing Weight: 97 kg  Vital Signs: Temp: 97.6 F (36.4 C) (10/02 1159) Temp Source: Oral (10/02 1159) BP: 99/69 (10/02 1159) Pulse Rate: 95 (10/02 1159)  Labs:  Recent Labs  11/24/15 0255 11/25/15 0339  11/25/15 2219 11/26/15 0415 11/26/15 0616 11/26/15 1112 11/26/15 1710  HGB 14.6 14.4  --   --  14.6  --   --   --   HCT 46.0 45.5  --   --  46.1  --   --   --   PLT 194 179  --   --  221  --   --   --   HEPARINUNFRC 0.51 0.94*  < > 0.74*  --  0.98*  --  0.40  CREATININE  --  1.12  --   --   --   --  1.32*  --   < > = values in this interval not displayed.  Estimated Creatinine Clearance: 60.5 mL/min (by C-G formula based on SCr of 1.32 mg/dL (H)).  Assessment:  3V CAD, for CABG on 10/5.  Heparin lvl now within goal   Goal of Therapy:  Heparin level 0.3-0.7 units/ml Monitor platelets by anticoagulation protocol: Yes   Plan:   Continue heparin drip to 900 units/hr.  Daily heparin level and CBC.  Levester Fresh, PharmD, BCPS, [facility] Clinical Pharmacist Pager [phone] 11/26/2015 5:53 PM

## 2015-11-26 NOTE — Progress Notes (Signed)
Patient Name: Shane Kim Date of Encounter: 11/26/2015  Primary Cardiologist: Dr. Rolly Pancake Problem List     Principal Problem:   Coronary artery disease involving native coronary artery of native heart with angina pectoris [facility]) Active Problems:   HLD (hyperlipidemia)   Shortness of breath   Acute systolic CHF (congestive heart failure) ([facility])   Hyperglycemia     Subjective   Feels well, denies chest pain, SOB and orthopnea.   Inpatient Medications    Scheduled Meds: . aspirin EC  81 mg Oral Daily  . atorvastatin  80 mg Oral q1800  . docusate sodium  100 mg Oral BID  . finasteride  5 mg Oral Daily  . furosemide  40 mg Intravenous BID  . lisinopril  2.5 mg Oral Daily  . potassium chloride  20 mEq Oral Daily  . sodium chloride flush  3 mL Intravenous Q12H   Continuous Infusions: . heparin 900 Units/hr (11/26/15 1018)   PRN Meds:.sodium chloride, acetaminophen, ondansetron (ZOFRAN) IV, sodium chloride flush, zolpidem   Vital Signs    Vitals:   11/25/15 1230 11/25/15 1708 11/25/15 2029 11/26/15 0447  BP: (!) 88/64 94/63 96/62  96/64  Pulse: 94 94 95 86  Resp: 18 18 19 19   Temp: 97.8 F (36.6 C)  98 F (36.7 C) 97.2 F (36.2 C)  TempSrc: Oral  Oral Oral  SpO2: 97%  99% 97%  Weight:    214 lb 9.6 oz (97.3 kg)  Height:        Intake/Output Summary (Last 24 hours) at 11/26/15 1034 Last data filed at 11/26/15 1018  Gross per 24 hour  Intake           711.23 ml  Output             2551 ml  Net         -1839.77 ml   Filed Weights   11/24/15 0409 11/25/15 0451 11/26/15 0447  Weight: 214 lb 14.4 oz (97.5 kg) 218 lb 12.8 oz (99.2 kg) 214 lb 9.6 oz (97.3 kg)    Physical Exam   GEN: Well nourished, well developed, in no acute distress.  HEENT: Grossly normal.  Neck: Supple, no JVD, carotid bruits, or masses. Cardiac: RRR, no murmurs, rubs, or gallops. No clubbing, cyanosis, edema.  Radials/DP/PT 2+ and equal bilaterally.  Respiratory:   Respirations regular and unlabored, crackles in bilateral lower lobes.  GI: Soft, nontender, nondistended, BS + x 4. MS: no deformity or atrophy. Skin: warm and dry, no rash. Neuro:  Strength and sensation are intact. Psych: AAOx3.  Normal affect.  Labs    CBC  Recent Labs  11/25/15 0339 11/26/15 0415  WBC 3.3* 3.2*  HGB 14.4 14.6  HCT 45.5 46.1  MCV 98.9 97.9  PLT 179 [id number]   Basic Metabolic Panel  Recent Labs  11/25/15 0339  NA 137  K 5.1  CL 102  CO2 25  GLUCOSE 82  BUN 23*  CREATININE 1.12  CALCIUM 9.1     Telemetry    NSR, Sinus tach - Personally Reviewed  ECG    NSR- Personally Reviewed  Radiology    No results found.   Cardiac Studies  Right/Left Heart Cath and Coronary Angiography 11/22/15  LM lesion, 40 %stenosed.  Prox RCA to Dist RCA lesion, 100 %stenosed.  Ost 2nd Mrg lesion, 100 %stenosed.  2nd Mrg lesion, 95 %stenosed.  Ost LAD to Prox LAD lesion, 90 %stenosed.  Ost 2nd Diag lesion, 90 %stenosed.  Mid LAD lesion, 40 %stenosed.  Dist LAD lesion, 40 %stenosed.  Prox Cx lesion, 90 %stenosed.  The left ventricular ejection fraction is less than 25% by visual estimate.  There is severe left ventricular systolic dysfunction.  Hemodynamic findings consistent with moderate pulmonary hypertension.    Transthoracic Echocardiography Study Conclusions  - Left ventricle: The cavity size was mildly dilated. Wall   thickness was increased in a pattern of mild LVH. Systolic   function was severely reduced, less than 20%. There is severe   diffuse global hypokinesis with minimal regional variability.   Doppler parameters are consistent with a reversible restrictive   pattern, indicative of decreased left ventricular diastolic   compliance and/or increased left atrial pressure (grade 3   diastolic dysfunction). - Mitral valve: There was moderate regurgitation. - Left atrium: The atrium was mildly dilated. - Right ventricle: Systolic  function was mildly reduced. - Pulmonary arteries: Systolic pressure was moderately increased.   PA peak pressure: 52 mm Hg (S).  Patient Profile     [name] is a 70 year old male with a past medical history of HLD, and prostate CA. He was admitted to [facility] with a 3-4 week history of increased SOB and BLE edema. Had heart cath on 11/22/15, report above. Transferred to [facility] for CABG.   Assessment & Plan    1. Severe 3 vessel CAD:  He underwent R/LHC on 9/28 that showed severe three-vessel coronary artery disease with severe proximal LAD, severe mid LCx, and occluded RCA. EF 20%, PCWP 29.For CABG on Thursday 11/29/15.   2. Ischemic Cardiomyopathy with EF of 20%: Medication optimization limited by hypotension in setting of reduced EF and global hypokinesis. Coreg discontinued, on 2.5mg  lisinopril. Does continue to have some persistent crackles in bases, would continue IV lasix. PCWP was 29 on admission. Cr stable, will order BMP to assess for hyperkalemia and renal function today.   Will also order chest x ray, yesterdays note notes cough. Will be helpful to assess pulmonary edema and exclude a PNA although no leukocytosis and afebrile. Likely cough related to acute on chronic systolic CHF.   3. HLD: LDL is 116. Continue high intensity statin.   Signed, Arbutus Leas, NP  11/26/2015, 10:34 AM   Patient seen, examined. Available data reviewed. Agree with findings, assessment, and plan as outlined by Jettie Booze, NP. The patient is independently interviewed and examined. He has an alert, oriented male in no distress. There are crackles in both lower lung fields left worse than right, heart is regular rate and rhythm without murmur or gallop, abdomen is soft and nontender, there is no peripheral edema. The patient appears stable as he is awaiting coronary bypass surgery. He is continued on IV heparin, aspirin, and high-dose atorvastatin. His blood pressure is low ranging primarily in the 80s to 90s.  Will reduce furosemide to 40 mg daily by mouth. He appears euvolemic on exam. Will continue on low-dose lisinopril today, but might need to discontinue prior to surgery to allow for an adequate blood pressure. Will follow-up tomorrow. All questions are answered.  [name], M.D. 11/26/2015 2:10 PM

## 2015-11-26 NOTE — Progress Notes (Signed)
Patient alert and oriented with with no distress. No complaints of pain. Light out for sleep.

## 2015-11-26 NOTE — Progress Notes (Signed)
Procedure(s) (LRB): CORONARY ARTERY BYPASS GRAFTING (CABG) (N/A) TRANSESOPHAGEAL ECHOCARDIOGRAM (TEE) (N/A) Subjective: No chest pain or shortness of breath.   Objective: Vital signs in last 24 hours: Temp:  [97.2 F (36.2 C)-98 F (36.7 C)] 97.6 F (36.4 C) (10/02 1159) Pulse Rate:  [86-95] 95 (10/02 1159) Cardiac Rhythm: Heart block (10/02 0700) Resp:  [18-19] 18 (10/02 1159) BP: (94-99)/(62-69) 99/69 (10/02 1159) SpO2:  [97 %-99 %] 98 % (10/02 1159) Weight:  [97.3 kg (214 lb 9.6 oz)] 97.3 kg (214 lb 9.6 oz) (10/02 0447)  Hemodynamic parameters for last 24 hours:    Intake/Output from previous day: 10/01 0701 - 10/02 0700 In: 889.2 [P.O.:720; I.V.:169.2] Out: 2501 [Urine:2500; Emesis/NG output:1] Intake/Output this shift: Total I/O In: 240 [P.O.:240] Out: 1070 [id number]  General appearance: alert and cooperative Heart: regular rate and rhythm, S1, S2 normal, no murmur, click, rub or gallop Lungs: rales bibasilar Extremities: extremities normal, atraumatic, no cyanosis or edema  Lab Results:  Recent Labs  11/25/15 0339 11/26/15 0415  WBC 3.3* 3.2*  HGB 14.4 14.6  HCT 45.5 46.1  PLT 179 221   BMET:  Recent Labs  11/25/15 0339 11/26/15 1112  NA 137 139  K 5.1 4.3  CL 102 100*  CO2 25 30  GLUCOSE 82 91  BUN 23* 20  CREATININE 1.12 1.32*  CALCIUM 9.1 9.6    PT/INR: No results for input(s): LABPROT, INR in the last 72 hours. ABG No results found for: PHART, HCO3, TCO2, ACIDBASEDEF, O2SAT CBG (last 3)  No results for input(s): GLUCAP in the last 72 hours.   Pre-op Cardiac Surgery  Carotid Findings:  Bilateral - 1% to 39% ICA stenosis. Vertebral artery flow is antegrade.  Upper Extremity Right Left  Brachial Pressures 84 Triphasic 87 Triphasic  Radial Waveforms Triphasic Triphasic  Ulnar Waveforms Triphasic Triphasic  Palmar Arch (Allen's Test) Normal Normal   Findings:  Doppler waveforms remained normal bilaterally with both radial and  ulnar compressions.    Lower  Extremity Right Left  Dorsalis Pedis 100 Triphasic 106 Triphasic  Posterior Tibial 105 Triphasic 94 Triphasic  Ankle/Brachial Indices 1.21 1.22    Findings:  ABIs and Doppler waveforms indicate normal arterial flow bilaterally at rest.   Shane Kim, Shane Kim 11/24/15    Assessment/Plan:  Severe multi-vessel coronary artery disease with severe LV dysfunction. He has been diuresed well and BP is borderline now in the 90's. I would stop his Lisinopril to decrease BP problems periop. Plan CABG Thursday. He is at high risk for continued systolic congestive heart failure postop if his LV does not significantly improve.  LOS: 4 days    Shane Kim 11/26/2015

## 2015-11-26 NOTE — Progress Notes (Signed)
CARDIAC REHAB PHASE I   PRE:  Rate/Rhythm: 90 SR  BP:  Sitting: 98/70        SaO2: 96 RA  MODE:  Ambulation: 460 ft   POST:  Rate/Rhythm: 106 ST  BP:  Sitting: 109/79         SaO2: 100 RA  Pt ambulated 460 ft on RA, IV, assist x1, steady gait, tolerated well with no complaints. Pt very appreciative of walk. Reinforced pre-op education, IS. Pt to bed per pt request after walk, call bell within reach. Will follow.   [place], RN, BSN 11/26/2015 11:13 AM

## 2015-11-27 LAB — CBC
HCT: 46 % (ref 39.0–52.0)
Hemoglobin: 14.6 g/dL (ref 13.0–17.0)
MCH: 30.8 pg (ref 26.0–34.0)
MCHC: 31.7 g/dL (ref 30.0–36.0)
MCV: 97 fL (ref 78.0–100.0)
Platelets: 215 10*3/uL (ref 150–400)
RBC: 4.74 MIL/uL (ref 4.22–5.81)
RDW: 15 % (ref 11.5–15.5)
WBC: 3 10*3/uL — AB (ref 4.0–10.5)

## 2015-11-27 LAB — HEPARIN LEVEL (UNFRACTIONATED)
HEPARIN UNFRACTIONATED: 0.24 [IU]/mL — AB (ref 0.30–0.70)
Heparin Unfractionated: 0.27 IU/mL — ABNORMAL LOW (ref 0.30–0.70)

## 2015-11-27 NOTE — Progress Notes (Signed)
    Subjective:  Feeling okay. No chest pain, no shortness of breath at rest, cough has improved.  Objective:  Vital Signs in the last 24 hours: Temp:  [97.6 F (36.4 C)-98.4 F (36.9 C)] 97.6 F (36.4 C) (10/03 0649) Pulse Rate:  [88-95] 88 (10/03 0649) Resp:  [18-20] 20 (10/03 0649) BP: (88-99)/(60-69) 98/67 (10/03 0649) SpO2:  [97 %-98 %] 97 % (10/03 0649) Weight:  [213 lb 6.4 oz (96.8 kg)] 213 lb 6.4 oz (96.8 kg) (10/03 0649)  Intake/Output from previous day: 10/02 0701 - 10/03 0700 In: 770.7 [P.O.:480; I.V.:290.7] Out: 1845 [Urine:1845]  Physical Exam: Pt is alert and oriented, NAD HEENT: normal Neck: JVP - normal Lungs: CTA bilaterally CV: RRR without murmur or gallop Abd: soft, NT, Positive BS, no hepatomegaly Ext: no C/C/E, distal pulses intact and equal Skin: warm/dry no rash   Lab Results:  Recent Labs  11/26/15 0415 11/27/15 0210  WBC 3.2* 3.0*  HGB 14.6 14.6  PLT 221 215    Recent Labs  11/25/15 0339 11/26/15 1112  NA 137 139  K 5.1 4.3  CL 102 100*  CO2 25 30  GLUCOSE 82 91  BUN 23* 20  CREATININE 1.12 1.32*   No results for input(s): TROPONINI in the last 72 hours.  Invalid input(s): CK, MB  Tele: Sinus rhythm  Assessment/Plan:  1. Acute systolic heart failure secondary to underlying severe ischemic cardiomyopathy. Patient has improved on current therapies. However, his blood pressure is running too low especially for someone with upcoming CABG. Will stop lisinopril. Continue daily furosemide. No beta blocker at present because of low blood pressure. Overall he has improved and his volume status is much better now with less dyspnea and resolution of his cough.  2. Severe multivessel coronary artery disease: Plans for CABG this Thursday. Remains on heparin and aspirin. No recurrent anginal symptoms.  Dispo: pt is stable on current Rx. Awaiting CABG Thursday. Lisinopril DC'd today for low BP.  [name], M.D. 11/27/2015, 11:13  AM

## 2015-11-27 NOTE — Care Management Important Message (Signed)
Important Message  Patient Details  Name: Shane Kim MRN: [id number] Date of Birth: Feb 01, 1946   Medicare Important Message Given:  Yes    [name] 11/27/2015, 11:05 AM

## 2015-11-27 NOTE — Progress Notes (Signed)
ANTICOAGULATION CONSULT NOTE - Follow Up Consult  Pharmacy Consult for Heparin Indication: CAD awaiting CABG  No Known Allergies  Patient Measurements: Height: 6\' 3"  (190.5 cm) Weight: 213 lb 6.4 oz (96.8 kg) (Scale A) IBW/kg (Calculated) : 84.5 Heparin Dosing Weight: 96.8 kg  Vital Signs: Temp: 97.6 F (36.4 C) (10/03 0649) Temp Source: Oral (10/03 0649) BP: 98/67 (10/03 0649) Pulse Rate: 88 (10/03 0649)  Labs:  Recent Labs  11/25/15 0339  11/26/15 0415 11/26/15 0616 11/26/15 1112 11/26/15 1710 11/27/15 0210  HGB 14.4  --  14.6  --   --   --  14.6  HCT 45.5  --  46.1  --   --   --  46.0  PLT 179  --  221  --   --   --  215  HEPARINUNFRC 0.94*  < >  --  0.98*  --  0.40 0.24*  CREATININE 1.12  --   --   --  1.32*  --   --   < > = values in this interval not displayed.  Estimated Creatinine Clearance: 60.5 mL/min (by C-G formula based on SCr of 1.32 mg/dL (H)).  Assessment:  3V CAD, for CABG on 10/5.  Heparin level now subtherapeutic (0.24) on 900 units/hr. No known infusion problems. CBC stable.  Goal of Therapy:  Heparin level 0.3-0.7 units/ml Monitor platelets by anticoagulation protocol: Yes   Plan:   Increase heparin drip to 1000 units/hr.  Next heparin level ~6 hrs after rate change.  Daily heparin level and CBC.  Arty Baumgartner, [place] Pager: [phone] 11/27/2015,8:48 AM

## 2015-11-27 NOTE — Progress Notes (Signed)
ANTICOAGULATION CONSULT NOTE - Follow Up Consult  Pharmacy Consult for Heparin Indication: CAD awaiting CABG  No Known Allergies  Patient Measurements: Height: 6\' 3"  (190.5 cm) Weight: 213 lb 6.4 oz (96.8 kg) (Scale A) IBW/kg (Calculated) : 84.5 Heparin Dosing Weight: 96.8 kg  Vital Signs: Temp: 98.1 F (36.7 C) (10/03 1222) Temp Source: Oral (10/03 1222) BP: 102/69 (10/03 1222) Pulse Rate: 91 (10/03 1222)  Labs:  Recent Labs  11/25/15 0339  11/26/15 0415  11/26/15 1112 11/26/15 1710 11/27/15 0210 11/27/15 1530  HGB 14.4  --  14.6  --   --   --  14.6  --   HCT 45.5  --  46.1  --   --   --  46.0  --   PLT 179  --  221  --   --   --  215  --   HEPARINUNFRC 0.94*  < >  --   < >  --  0.40 0.24* 0.27*  CREATININE 1.12  --   --   --  1.32*  --   --   --   < > = values in this interval not displayed.  Estimated Creatinine Clearance: 60.5 mL/min (by C-G formula based on SCr of 1.32 mg/dL (H)).  Assessment:  3V CAD, for CABG on 10/5.  Heparin level subtherapeutic (0.27) on 1000 units/hr. No known infusion problems. CBC stable.  Goal of Therapy:  Heparin level 0.3-0.7 units/ml Monitor platelets by anticoagulation protocol: Yes   Plan:   Increase heparin drip to 1200 units/hr.  Next level 0000  Daily heparin level and CBC.  Levester Fresh, PharmD, BCPS, [facility] Clinical Pharmacist Pager [phone] 11/27/2015 4:31 PM

## 2015-11-28 DIAGNOSIS — I2510 Atherosclerotic heart disease of native coronary artery without angina pectoris: Secondary | ICD-10-CM

## 2015-11-28 DIAGNOSIS — I255 Ischemic cardiomyopathy: Secondary | ICD-10-CM

## 2015-11-28 LAB — CBC
HCT: 45.7 % (ref 39.0–52.0)
HEMOGLOBIN: 14.6 g/dL (ref 13.0–17.0)
MCH: 31.1 pg (ref 26.0–34.0)
MCHC: 31.9 g/dL (ref 30.0–36.0)
MCV: 97.4 fL (ref 78.0–100.0)
PLATELETS: 234 10*3/uL (ref 150–400)
RBC: 4.69 MIL/uL (ref 4.22–5.81)
RDW: 15 % (ref 11.5–15.5)
WBC: 3.5 10*3/uL — AB (ref 4.0–10.5)

## 2015-11-28 LAB — SURGICAL PCR SCREEN
MRSA, PCR: NEGATIVE
Staphylococcus aureus: NEGATIVE

## 2015-11-28 LAB — HEPARIN LEVEL (UNFRACTIONATED)
HEPARIN UNFRACTIONATED: 0.4 [IU]/mL (ref 0.30–0.70)
Heparin Unfractionated: 0.44 IU/mL (ref 0.30–0.70)

## 2015-11-28 LAB — ABO/RH: ABO/RH(D): O POS

## 2015-11-28 MED ORDER — DEXMEDETOMIDINE HCL IN NACL 400 MCG/100ML IV SOLN
0.1000 ug/kg/h | INTRAVENOUS | Status: DC
  Filled 2015-11-28: qty 100

## 2015-11-28 MED ORDER — DOPAMINE-DEXTROSE 3.2-5 MG/ML-% IV SOLN
0.0000 ug/kg/min | INTRAVENOUS | Status: AC
  Administered 2015-11-29: 4 ug/kg/min via INTRAVENOUS
  Filled 2015-11-28: qty 250

## 2015-11-28 MED ORDER — PLASMA-LYTE 148 IV SOLN
INTRAVENOUS | Status: AC
  Administered 2015-11-29: 500 mL
  Filled 2015-11-28: qty 2.5

## 2015-11-28 MED ORDER — TRANEXAMIC ACID (OHS) BOLUS VIA INFUSION
15.0000 mg/kg | INTRAVENOUS | Status: DC
  Filled 2015-11-28: qty 1458

## 2015-11-28 MED ORDER — METOPROLOL TARTRATE 12.5 MG HALF TABLET
12.5000 mg | ORAL_TABLET | Freq: Once | ORAL | Status: AC
  Administered 2015-11-29: 12.5 mg via ORAL
  Filled 2015-11-28: qty 1

## 2015-11-28 MED ORDER — TEMAZEPAM 15 MG PO CAPS: 15.0000 mg | ORAL_CAPSULE | Freq: Once | ORAL | Status: DC | PRN

## 2015-11-28 MED ORDER — ALPRAZOLAM 0.25 MG PO TABS: 0.2500 mg | ORAL_TABLET | ORAL | Status: DC | PRN

## 2015-11-28 MED ORDER — HEPARIN SODIUM (PORCINE) 1000 UNIT/ML IJ SOLN
INTRAMUSCULAR | Status: DC
  Filled 2015-11-28: qty 30

## 2015-11-28 MED ORDER — MAGNESIUM SULFATE 50 % IJ SOLN
40.0000 meq | INTRAMUSCULAR | Status: DC
  Filled 2015-11-28: qty 10

## 2015-11-28 MED ORDER — CHLORHEXIDINE GLUCONATE CLOTH 2 % EX PADS: 6.0000 | MEDICATED_PAD | Freq: Once | CUTANEOUS | Status: AC

## 2015-11-28 MED ORDER — SODIUM CHLORIDE 0.9 % IV SOLN
INTRAVENOUS | Status: DC
  Filled 2015-11-28: qty 2.5

## 2015-11-28 MED ORDER — BISACODYL 5 MG PO TBEC
5.0000 mg | DELAYED_RELEASE_TABLET | Freq: Once | ORAL | Status: DC
  Filled 2015-11-28: qty 1

## 2015-11-28 MED ORDER — TRANEXAMIC ACID 1000 MG/10ML IV SOLN
1.5000 mg/kg/h | INTRAVENOUS | Status: DC
  Filled 2015-11-28: qty 25

## 2015-11-28 MED ORDER — DEXTROSE 5 % IV SOLN
750.0000 mg | INTRAVENOUS | Status: DC
  Filled 2015-11-28: qty 750

## 2015-11-28 MED ORDER — CHLORHEXIDINE GLUCONATE CLOTH 2 % EX PADS
6.0000 | MEDICATED_PAD | Freq: Once | CUTANEOUS | Status: AC
  Administered 2015-11-28: 6 via TOPICAL

## 2015-11-28 MED ORDER — CHLORHEXIDINE GLUCONATE 0.12 % MT SOLN
15.0000 mL | Freq: Once | OROMUCOSAL | Status: AC
  Administered 2015-11-29: 15 mL via OROMUCOSAL
  Filled 2015-11-28: qty 15

## 2015-11-28 MED ORDER — DIAZEPAM 5 MG PO TABS
5.0000 mg | ORAL_TABLET | Freq: Once | ORAL | Status: AC
  Administered 2015-11-29: 5 mg via ORAL
  Filled 2015-11-28: qty 1

## 2015-11-28 MED ORDER — DEXTROSE 5 % IV SOLN
1.5000 g | INTRAVENOUS | Status: AC
  Administered 2015-11-29: 1.5 g via INTRAVENOUS
  Administered 2015-11-29: .75 g via INTRAVENOUS
  Filled 2015-11-28 (×2): qty 1.5

## 2015-11-28 MED ORDER — POTASSIUM CHLORIDE 2 MEQ/ML IV SOLN
80.0000 meq | INTRAVENOUS | Status: DC
  Filled 2015-11-28: qty 40

## 2015-11-28 MED ORDER — DEXTROSE 5 % IV SOLN
0.0000 ug/min | INTRAVENOUS | Status: DC
  Filled 2015-11-28: qty 4

## 2015-11-28 MED ORDER — SODIUM CHLORIDE 0.9 % IV SOLN
1500.0000 mg | INTRAVENOUS | Status: DC
  Filled 2015-11-28 (×2): qty 1500

## 2015-11-28 MED ORDER — TRANEXAMIC ACID (OHS) PUMP PRIME SOLUTION
2.0000 mg/kg | INTRAVENOUS | Status: DC
  Filled 2015-11-28: qty 1.94

## 2015-11-28 MED ORDER — PHENYLEPHRINE HCL 10 MG/ML IJ SOLN
30.0000 ug/min | INTRAMUSCULAR | Status: DC
  Filled 2015-11-28: qty 2

## 2015-11-28 MED ORDER — NITROGLYCERIN IN D5W 200-5 MCG/ML-% IV SOLN
2.0000 ug/min | INTRAVENOUS | Status: AC
  Administered 2015-11-29: 5 ug/min via INTRAVENOUS
  Filled 2015-11-28: qty 250

## 2015-11-28 NOTE — Progress Notes (Signed)
Procedure(s) (LRB): CORONARY ARTERY BYPASS GRAFTING (CABG) (N/A) TRANSESOPHAGEAL ECHOCARDIOGRAM (TEE) (N/A) Subjective:  No complaints of chest pain or shortness of breath. Ambulated today.  Objective: Vital signs in last 24 hours: Temp:  [97.4 F (36.3 C)-97.9 F (36.6 C)] 97.4 F (36.3 C) (10/04 1200) Pulse Rate:  [86-98] 98 (10/04 1200) Cardiac Rhythm: Sinus tachycardia (10/04 0712) Resp:  [18-20] 20 (10/04 1200) BP: (94-97)/(58-73) 97/73 (10/04 1200) SpO2:  [97 %-100 %] 97 % (10/04 1200) Weight:  [97.2 kg (214 lb 4.8 oz)] 97.2 kg (214 lb 4.8 oz) (10/04 0500)  Hemodynamic parameters for last 24 hours:    Intake/Output from previous day: 10/03 0701 - 10/04 0700 In: 791 [P.O.:480; I.V.:311] Out: 1065 [Urine:1065] Intake/Output this shift: Total I/O In: 240 [P.O.:240] Out: 600 [Urine:600]  General appearance: alert and cooperative Neurologic: intact Heart: regular rate and rhythm, S1, S2 normal, no murmur, click, rub or gallop Lungs: clear to auscultation bilaterally Extremities: extremities normal, atraumatic, no cyanosis or edema  Lab Results:  Recent Labs  11/27/15 0210 11/28/15 0323  WBC 3.0* 3.5*  HGB 14.6 14.6  HCT 46.0 45.7  PLT 215 234   BMET:  Recent Labs  11/26/15 1112  NA 139  K 4.3  CL 100*  CO2 30  GLUCOSE 91  BUN 20  CREATININE 1.32*  CALCIUM 9.6    PT/INR: No results for input(s): LABPROT, INR in the last 72 hours. ABG No results found for: PHART, HCO3, TCO2, ACIDBASEDEF, O2SAT CBG (last 3)  No results for input(s): GLUCAP in the last 72 hours.  Assessment/Plan:  Severe multi-vessel coronary artery disease with severe LV dysfunction. Plan CABG tomorrow. The patient has no further questions.   LOS: 6 days    Gaye Pollack 11/28/2015

## 2015-11-28 NOTE — Progress Notes (Signed)
Patient Name: Shane Kim Date of Encounter: 11/28/2015  Primary Cardiologist: Johnny Bridge, MD   Hospital Problem List     Principal Problem:   Acute systolic CHF (congestive heart failure) ([place]) Active Problems:   Shortness of breath   Coronary artery disease involving native coronary artery of native heart with angina pectoris (HCC)   Cardiomyopathy, ischemic   HLD (hyperlipidemia)   Hyperglycemia   Subjective   No chest pain of dyspnea.    Inpatient Medications    . aspirin EC  81 mg Oral Daily  . atorvastatin  80 mg Oral q1800  . bisacodyl  5 mg Oral Once  . [START ON 11/29/2015] cefUROXime (ZINACEF)  IV  1.5 g Intravenous To OR  . [START ON 11/29/2015] cefUROXime (ZINACEF)  IV  750 mg Intravenous To OR  . [START ON 11/29/2015] dexmedetomidine  0.1-0.7 mcg/kg/hr Intravenous To OR  . [START ON 11/29/2015] diazepam  5 mg Oral Once  . docusate sodium  100 mg Oral BID  . [START ON 11/29/2015] DOPamine  0-10 mcg/kg/min Intravenous To OR  . [START ON 11/29/2015] epinephrine  0-10 mcg/min Intravenous To OR  . finasteride  5 mg Oral Daily  . furosemide  40 mg Oral Daily  . [START ON 11/29/2015] heparin-papaverine-plasmalyte irrigation   Irrigation To OR  . [START ON 11/29/2015] heparin 30,000 units/NS 1000 mL solution for CELLSAVER   Other To OR  . [START ON 11/29/2015] insulin (NOVOLIN-R) infusion   Intravenous To OR  . [START ON 11/29/2015] magnesium sulfate  40 mEq Other To OR  . [START ON 11/29/2015] metoprolol tartrate  12.5 mg Oral Once  . [START ON 11/29/2015] nitroGLYCERIN  2-200 mcg/min Intravenous To OR  . [START ON 11/29/2015] phenylephrine (NEO-SYNEPHRINE) Adult infusion  30-200 mcg/min Intravenous To OR  . [START ON 11/29/2015] potassium chloride  80 mEq Other To OR  . potassium chloride  20 mEq Oral Daily  . sodium chloride flush  3 mL Intravenous Q12H  . [START ON 11/29/2015] tranexamic acid (CYKLOKAPRON) infusion ([facility])  1.5 mg/kg/hr Intravenous To OR  . [START ON  11/29/2015] tranexamic acid  15 mg/kg Intravenous To OR  . [START ON 11/29/2015] tranexamic acid  2 mg/kg Intracatheter To OR  . [START ON 11/29/2015] vancomycin  1,500 mg Intravenous To OR    Vital Signs    Vitals:   11/27/15 1222 11/27/15 1948 11/28/15 0500 11/28/15 0957  BP: 102/69 96/70 94/69  (!) 94/58  Pulse: 91 87 86 88  Resp: 18 18 18    Temp: 98.1 F (36.7 C) 97.9 F (36.6 C) 97.6 F (36.4 C) 97.6 F (36.4 C)  TempSrc: Oral Oral Oral Oral  SpO2: 98% 98% 98% 100%  Weight:   214 lb 4.8 oz (97.2 kg)   Height:        Intake/Output Summary (Last 24 hours) at 11/28/15 1223 Last data filed at 11/28/15 0900  Gross per 24 hour  Intake           908.03 ml  Output              965 ml  Net           -56.97 ml   Filed Weights   11/26/15 0447 11/27/15 0649 11/28/15 0500  Weight: 214 lb 9.6 oz (97.3 kg) 213 lb 6.4 oz (96.8 kg) 214 lb 4.8 oz (97.2 kg)    Physical Exam   GEN: Well nourished, well developed, in no acute distress.  HEENT: Grossly normal.  Neck: Supple, no JVD, carotid bruits, or masses. Cardiac: RRR, no murmurs, rubs, or gallops. No clubbing, cyanosis, edema.  Radials/DP/PT 2+ and equal bilaterally.  Respiratory:  Respirations regular and unlabored, initially w/ rhonchi but cleared with cough. GI: Soft, nontender, nondistended, BS + x 4. MS: no deformity or atrophy. Skin: warm and dry, no rash. Neuro:  Strength and sensation are intact. Psych: AAOx3.  Normal affect.  Labs    CBC  Recent Labs  11/27/15 0210 11/28/15 0323  WBC 3.0* 3.5*  HGB 14.6 14.6  HCT 46.0 45.7  MCV 97.0 97.4  PLT 215 [id number]   Basic Metabolic Panel  Recent Labs  11/26/15 1112  NA 139  K 4.3  CL 100*  CO2 30  GLUCOSE 91  BUN 20  CREATININE 1.32*  CALCIUM 9.6    Telemetry    RSR  Radiology    Dg Chest 2 View  Result Date: 11/26/2015 CLINICAL DATA:  History of congestive heart failure. EXAM: CHEST  2 VIEW COMPARISON:  PA and lateral chest 11/21/2015 and 11/15/2015.  FINDINGS: There is cardiomegaly and mild vascular congestion. No consolidative process, edema, pneumothorax or effusion. IMPRESSION: Cardiomegaly and mild vascular congestion. Electronically Signed   By: Inge Rise M.D.   On: 11/26/2015 15:00   Patient Profile     70 y/o ? with a h/o prostate CA but no prior cardiac hx, that was admitted to [facility] on 9/27 with dyspnea, orthopnea, and CHF, and was found to have severe LV dysfxn (EF 20%), and subsequently multivessel CAD, now pending CABG.   Assessment & Plan    1.  Acute systolic CHF/ICM:  EF [id number] by echo on 9/27. Euvolemic on exam and tolerating PO lasix.  BP has been soft and thus preventing initiation of  blocker.  He had been on lisinopril however this was d/c'd yesterday.  Plan to resume  blocker and hopefully initiate entresto postoperatively if BP allows.  2.  CAD:  S/p cath @ [facility] revealing severe multivessel CAD.  He has not been having chest pain and remains on asa and high potency statin therapy.    3.  HL:  LDL 116.  Nl lft's.  Cont high potency statin therapy.  Signed, [name] NP 11/28/2015, 12:23 PM   Patient seen, examined. Available data reviewed. Agree with findings, assessment, and plan as outlined by [name], NP. The patient feels well and he is ready for surgery.  All questions are answered.  He was seen by Dr. Cyndia Bent earlier this afternoon.  He has been taken off of all of his medications except for furosemide to allow his blood pressure to stabilize.  He is still running a systolic pressure in the 0000000.  He appears to be asymptomatic.  [name], M.D. 11/28/2015 7:16 PM

## 2015-11-28 NOTE — Anesthesia Preprocedure Evaluation (Addendum)
Anesthesia Evaluation  Patient identified by MRN, date of birth, ID band Patient awake    Reviewed: Allergy & Precautions, H&P , NPO status , Patient's Chart, lab work & pertinent test results  Airway Mallampati: III  TM Distance: >3 FB Neck ROM: Full    Dental no notable dental hx. (+) Partial Lower, Partial Upper, Dental Advisory Given   Pulmonary neg pulmonary ROS,    Pulmonary exam normal breath sounds clear to auscultation       Cardiovascular + CAD and +CHF   Rhythm:Regular Rate:Normal     Neuro/Psych negative neurological ROS  negative psych ROS   GI/Hepatic negative GI ROS, Neg liver ROS,   Endo/Other  negative endocrine ROS  Renal/GU negative Renal ROS  negative genitourinary   Musculoskeletal   Abdominal   Peds  Hematology negative hematology ROS (+)   Anesthesia Other Findings   Reproductive/Obstetrics negative OB ROS                            Anesthesia Physical Anesthesia Plan  ASA: IV  Anesthesia Plan: General   Post-op Pain Management:    Induction: Intravenous  Airway Management Planned: Oral ETT  Additional Equipment: Arterial line, CVP, PA Cath, TEE and Ultrasound Guidance Line Placement  Intra-op Plan:   Post-operative Plan: Post-operative intubation/ventilation  Informed Consent: I have reviewed the patients History and Physical, chart, labs and discussed the procedure including the risks, benefits and alternatives for the proposed anesthesia with the patient or authorized representative who has indicated his/her understanding and acceptance.   Dental advisory given  Plan Discussed with: CRNA  Anesthesia Plan Comments:         Anesthesia Quick Evaluation

## 2015-11-28 NOTE — Progress Notes (Signed)
ANTICOAGULATION CONSULT NOTE - Follow Up Consult  Pharmacy Consult for Heparin Indication: CAD awaiting CABG  No Known Allergies  Patient Measurements: Height: 6\' 3"  (190.5 cm) Weight: 213 lb 6.4 oz (96.8 kg) (Scale A) IBW/kg (Calculated) : 84.5 Heparin Dosing Weight: 96.8 kg  Vital Signs: Temp: 97.9 F (36.6 C) (10/03 1948) Temp Source: Oral (10/03 1948) BP: 96/70 (10/03 1948) Pulse Rate: 87 (10/03 1948)  Labs:  Recent Labs  11/25/15 0339  11/26/15 0415  11/26/15 1112  11/27/15 0210 11/27/15 1530 11/28/15 0031  HGB 14.4  --  14.6  --   --   --  14.6  --   --   HCT 45.5  --  46.1  --   --   --  46.0  --   --   PLT 179  --  221  --   --   --  215  --   --   HEPARINUNFRC 0.94*  < >  --   < >  --   < > 0.24* 0.27* 0.44  CREATININE 1.12  --   --   --  1.32*  --   --   --   --   < > = values in this interval not displayed.  Estimated Creatinine Clearance: 60.5 mL/min (by C-G formula based on SCr of 1.32 mg/dL (H)).  Assessment: 70 y.o. male with CAD awaiting CABG, for heparin  Goal of Therapy:  Heparin level 0.3-0.7 units/ml Monitor platelets by anticoagulation protocol: Yes   Plan:  Continue Heparin at current rate   Phillis Knack, PharmD, BCPS  11/28/2015 1:34 AM

## 2015-11-28 NOTE — Progress Notes (Signed)
Pt with order to give dulcolax 5mg  po before surgery.  Order says give unless pt has left main CAD.  Call placed to Dr. Ellison Hughs for clarification.  Instructed by Thurmond Butts that MD is seen pt and will ask him to give me a call.  Karie Kirks, Therapist, sports.

## 2015-11-28 NOTE — Progress Notes (Signed)
Call received from [facility] Dr. Margot Chimes  Nurse states that Dr. Cyndia Bent stated that to hold the dulcolax 5mg   That was ordered for surgery.  Karie Kirks, Therapist, sports.

## 2015-11-28 NOTE — Progress Notes (Signed)
CARDIAC REHAB PHASE I   PRE:  Rate/Rhythm: 83 SR    BP: sitting 118/80    SaO2: 98 RA  MODE:  Ambulation: 720 ft   POST:  Rate/Rhythm: 107 ST    BP: sitting 120/70     SaO2: 98 RA  Pt ambulated independently without problems. Feels well. 1200 mL on IS, encouraged continued use today. Sts he has no questions regarding OHS.  [id number]   [place], ACSM 11/28/2015 10:42 AM

## 2015-11-29 ENCOUNTER — Inpatient Hospital Stay (HOSPITAL_COMMUNITY): Payer: BLUE CROSS/BLUE SHIELD | Admitting: Anesthesiology

## 2015-11-29 ENCOUNTER — Encounter (HOSPITAL_COMMUNITY)
Admission: AD | Disposition: A | Discharge status: Home / Self Care | Payer: Self-pay | Source: Other Acute Inpatient Hospital | Attending: Cardiovascular Disease

## 2015-11-29 ENCOUNTER — Other Ambulatory Visit (HOSPITAL_COMMUNITY): Payer: Self-pay

## 2015-11-29 ENCOUNTER — Inpatient Hospital Stay (HOSPITAL_COMMUNITY): Payer: BLUE CROSS/BLUE SHIELD

## 2015-11-29 ENCOUNTER — Other Ambulatory Visit: Payer: Self-pay

## 2015-11-29 DIAGNOSIS — I2510 Atherosclerotic heart disease of native coronary artery without angina pectoris: Secondary | ICD-10-CM | POA: Diagnosis present

## 2015-11-29 HISTORY — PX: CARDIAC CATHETERIZATION: SHX172

## 2015-11-29 HISTORY — PX: CORONARY ARTERY BYPASS GRAFT: SHX141

## 2015-11-29 HISTORY — PX: TEE WITHOUT CARDIOVERSION: SHX5443

## 2015-11-29 LAB — CREATININE, SERUM
CREATININE: 0.93 mg/dL (ref 0.61–1.24)
GFR calc Af Amer: >60 mL/min (ref >60)
GFR calc non Af Amer: >60 mL/min (ref >60)

## 2015-11-29 LAB — CBC
HEMATOCRIT: 45.9 % (ref 39.0–52.0)
HEMATOCRIT: 40 % (ref 39.0–52.0)
HEMATOCRIT: 34.6 % — AB (ref 39.0–52.0)
HEMOGLOBIN: 14.6 g/dL (ref 13.0–17.0)
Hemoglobin: 12.7 g/dL — ABNORMAL LOW (ref 13.0–17.0)
Hemoglobin: 11 g/dL — ABNORMAL LOW (ref 13.0–17.0)
MCH: 30.7 pg (ref 26.0–34.0)
MCH: 30.5 pg (ref 26.0–34.0)
MCH: 30.6 pg (ref 26.0–34.0)
MCHC: 31.8 g/dL (ref 30.0–36.0)
MCHC: 31.8 g/dL (ref 30.0–36.0)
MCHC: 31.8 g/dL (ref 30.0–36.0)
MCV: 96.6 fL (ref 78.0–100.0)
MCV: 96.2 fL (ref 78.0–100.0)
MCV: 96.1 fL (ref 78.0–100.0)
PLATELETS: 179 10*3/uL (ref 150–400)
Platelets: 231 10*3/uL (ref 150–400)
Platelets: 178 10*3/uL (ref 150–400)
RBC: 4.75 MIL/uL (ref 4.22–5.81)
RBC: 4.16 MIL/uL — ABNORMAL LOW (ref 4.22–5.81)
RBC: 3.6 MIL/uL — ABNORMAL LOW (ref 4.22–5.81)
RDW: 14.8 % (ref 11.5–15.5)
RDW: 14.5 % (ref 11.5–15.5)
RDW: 14.4 % (ref 11.5–15.5)
WBC: 3 10*3/uL — ABNORMAL LOW (ref 4.0–10.5)
WBC: 8.9 10*3/uL (ref 4.0–10.5)
WBC: 6.2 10*3/uL (ref 4.0–10.5)

## 2015-11-29 LAB — APTT: aPTT: 33 seconds (ref 24–36)

## 2015-11-29 LAB — POCT I-STAT, CHEM 8
BUN: 19 mg/dL (ref 6–20)
BUN: 16 mg/dL (ref 6–20)
BUN: 18 mg/dL (ref 6–20)
BUN: 17 mg/dL (ref 6–20)
BUN: 17 mg/dL (ref 6–20)
BUN: 13 mg/dL (ref 6–20)
CALCIUM ION: 1.22 mmol/L (ref 1.15–1.40)
CALCIUM ION: 1.19 mmol/L (ref 1.15–1.40)
CALCIUM ION: 1.4 mmol/L (ref 1.15–1.40)
CHLORIDE: 101 mmol/L (ref 101–111)
CHLORIDE: 101 mmol/L (ref 101–111)
CHLORIDE: 98 mmol/L — AB (ref 101–111)
CHLORIDE: 100 mmol/L — AB (ref 101–111)
CHLORIDE: 99 mmol/L — AB (ref 101–111)
CHLORIDE: 99 mmol/L — AB (ref 101–111)
CREATININE: 0.9 mg/dL (ref 0.61–1.24)
CREATININE: 0.7 mg/dL (ref 0.61–1.24)
CREATININE: 0.8 mg/dL (ref 0.61–1.24)
CREATININE: 0.9 mg/dL (ref 0.61–1.24)
Calcium, Ion: 1.24 mmol/L (ref 1.15–1.40)
Calcium, Ion: 1.02 mmol/L — ABNORMAL LOW (ref 1.15–1.40)
Calcium, Ion: 1.12 mmol/L — ABNORMAL LOW (ref 1.15–1.40)
Creatinine, Ser: 0.8 mg/dL (ref 0.61–1.24)
Creatinine, Ser: 0.9 mg/dL (ref 0.61–1.24)
GLUCOSE: 98 mg/dL (ref 65–99)
GLUCOSE: 104 mg/dL — AB (ref 65–99)
GLUCOSE: 127 mg/dL — AB (ref 65–99)
GLUCOSE: 137 mg/dL — AB (ref 65–99)
Glucose, Bld: 91 mg/dL (ref 65–99)
Glucose, Bld: 148 mg/dL — ABNORMAL HIGH (ref 65–99)
HCT: 41 % (ref 39.0–52.0)
HCT: 36 % — ABNORMAL LOW (ref 39.0–52.0)
HCT: 39 % (ref 39.0–52.0)
HCT: 35 % — ABNORMAL LOW (ref 39.0–52.0)
HEMATOCRIT: 47 % (ref 39.0–52.0)
HEMATOCRIT: 35 % — AB (ref 39.0–52.0)
HEMOGLOBIN: 11.9 g/dL — AB (ref 13.0–17.0)
Hemoglobin: 16 g/dL (ref 13.0–17.0)
Hemoglobin: 11.9 g/dL — ABNORMAL LOW (ref 13.0–17.0)
Hemoglobin: 13.9 g/dL (ref 13.0–17.0)
Hemoglobin: 12.2 g/dL — ABNORMAL LOW (ref 13.0–17.0)
Hemoglobin: 13.3 g/dL (ref 13.0–17.0)
POTASSIUM: 4.3 mmol/L (ref 3.5–5.1)
POTASSIUM: 4.4 mmol/L (ref 3.5–5.1)
POTASSIUM: 4.7 mmol/L (ref 3.5–5.1)
POTASSIUM: 5.4 mmol/L — AB (ref 3.5–5.1)
Potassium: 4.8 mmol/L (ref 3.5–5.1)
Potassium: 4.6 mmol/L (ref 3.5–5.1)
SODIUM: 135 mmol/L (ref 135–145)
Sodium: 137 mmol/L (ref 135–145)
Sodium: 137 mmol/L (ref 135–145)
Sodium: 138 mmol/L (ref 135–145)
Sodium: 133 mmol/L — ABNORMAL LOW (ref 135–145)
Sodium: 135 mmol/L (ref 135–145)
TCO2: 27 mmol/L (ref 0–100)
TCO2: 28 mmol/L (ref 0–100)
TCO2: 28 mmol/L (ref 0–100)
TCO2: 27 mmol/L (ref 0–100)
TCO2: 29 mmol/L (ref 0–100)
TCO2: 25 mmol/L (ref 0–100)

## 2015-11-29 LAB — POCT I-STAT 3, ART BLOOD GAS (G3+)
ACID-BASE DEFICIT: 2 mmol/L (ref 0.0–2.0)
ACID-BASE DEFICIT: 3 mmol/L — AB (ref 0.0–2.0)
ACID-BASE EXCESS: 5 mmol/L — AB (ref 0.0–2.0)
Acid-base deficit: 2 mmol/L (ref 0.0–2.0)
BICARBONATE: 28 mmol/L (ref 20.0–28.0)
BICARBONATE: 29.5 mmol/L — AB (ref 20.0–28.0)
BICARBONATE: 25 mmol/L (ref 20.0–28.0)
Bicarbonate: 24.5 mmol/L (ref 20.0–28.0)
Bicarbonate: 23.3 mmol/L (ref 20.0–28.0)
Bicarbonate: 23.9 mmol/L (ref 20.0–28.0)
O2 SAT: 98 %
O2 SAT: 99 %
O2 Saturation: 100 %
O2 Saturation: 100 %
O2 Saturation: 98 %
O2 Saturation: 96 %
PH ART: 7.312 — AB (ref 7.350–7.450)
PH ART: 7.32 — AB (ref 7.350–7.450)
PO2 ART: 449 mmHg — AB (ref 83.0–108.0)
PO2 ART: 423 mmHg — AB (ref 83.0–108.0)
PO2 ART: 124 mmHg — AB (ref 83.0–108.0)
PO2 ART: 134 mmHg — AB (ref 83.0–108.0)
Patient temperature: 36.4
Patient temperature: 37.3
TCO2: 30 mmol/L (ref 0–100)
TCO2: 31 mmol/L (ref 0–100)
TCO2: 26 mmol/L (ref 0–100)
TCO2: 25 mmol/L (ref 0–100)
TCO2: 26 mmol/L (ref 0–100)
TCO2: 25 mmol/L (ref 0–100)
pCO2 arterial: 55.3 mmHg — ABNORMAL HIGH (ref 32.0–48.0)
pCO2 arterial: 44.4 mmHg (ref 32.0–48.0)
pCO2 arterial: 47.2 mmHg (ref 32.0–48.0)
pCO2 arterial: 45.8 mmHg (ref 32.0–48.0)
pCO2 arterial: 42.5 mmHg (ref 32.0–48.0)
pCO2 arterial: 45.8 mmHg (ref 32.0–48.0)
pH, Arterial: 7.43 (ref 7.350–7.450)
pH, Arterial: 7.312 — ABNORMAL LOW (ref 7.350–7.450)
pH, Arterial: 7.378 (ref 7.350–7.450)
pH, Arterial: 7.327 — ABNORMAL LOW (ref 7.350–7.450)
pO2, Arterial: 112 mmHg — ABNORMAL HIGH (ref 83.0–108.0)
pO2, Arterial: 92 mmHg (ref 83.0–108.0)

## 2015-11-29 LAB — BASIC METABOLIC PANEL
ANION GAP: 9 (ref 5–15)
BUN: 19 mg/dL (ref 6–20)
CALCIUM: 9.5 mg/dL (ref 8.9–10.3)
CHLORIDE: 99 mmol/L — AB (ref 101–111)
CO2: 27 mmol/L (ref 22–32)
CREATININE: 1.13 mg/dL (ref 0.61–1.24)
GFR calc non Af Amer: >60 mL/min (ref >60)
Glucose, Bld: 92 mg/dL (ref 65–99)
Potassium: 4.6 mmol/L (ref 3.5–5.1)
SODIUM: 135 mmol/L (ref 135–145)

## 2015-11-29 LAB — POCT I-STAT 4, (NA,K, GLUC, HGB,HCT)
GLUCOSE: 137 mg/dL — AB (ref 65–99)
HCT: 38 % — ABNORMAL LOW (ref 39.0–52.0)
HEMOGLOBIN: 12.9 g/dL — AB (ref 13.0–17.0)
POTASSIUM: 4.3 mmol/L (ref 3.5–5.1)
Sodium: 137 mmol/L (ref 135–145)

## 2015-11-29 LAB — GLUCOSE, CAPILLARY
GLUCOSE-CAPILLARY: 137 mg/dL — AB (ref 65–99)
GLUCOSE-CAPILLARY: 129 mg/dL — AB (ref 65–99)
GLUCOSE-CAPILLARY: 121 mg/dL — AB (ref 65–99)
GLUCOSE-CAPILLARY: 139 mg/dL — AB (ref 65–99)
Glucose-Capillary: 115 mg/dL — ABNORMAL HIGH (ref 65–99)
Glucose-Capillary: 129 mg/dL — ABNORMAL HIGH (ref 65–99)
Glucose-Capillary: 135 mg/dL — ABNORMAL HIGH (ref 65–99)
Glucose-Capillary: 142 mg/dL — ABNORMAL HIGH (ref 65–99)
Glucose-Capillary: 39 mg/dL — CL (ref 65–99)

## 2015-11-29 LAB — HEMOGLOBIN AND HEMATOCRIT, BLOOD
HEMATOCRIT: 35.8 % — AB (ref 39.0–52.0)
HEMOGLOBIN: 11.5 g/dL — AB (ref 13.0–17.0)

## 2015-11-29 LAB — PROTIME-INR
INR: 1.3
Prothrombin Time: 16.3 seconds — ABNORMAL HIGH (ref 11.4–15.2)

## 2015-11-29 LAB — MAGNESIUM: Magnesium: 2.6 mg/dL — ABNORMAL HIGH (ref 1.7–2.4)

## 2015-11-29 LAB — PLATELET COUNT: PLATELETS: 185 10*3/uL (ref 150–400)

## 2015-11-29 LAB — PREPARE RBC (CROSSMATCH)

## 2015-11-29 LAB — HEPARIN LEVEL (UNFRACTIONATED): HEPARIN UNFRACTIONATED: 0.54 [IU]/mL (ref 0.30–0.70)

## 2015-11-29 SURGERY — CORONARY ARTERY BYPASS GRAFTING (CABG)
Anesthesia: General | Site: Groin

## 2015-11-29 MED ORDER — MIDAZOLAM HCL 5 MG/5ML IJ SOLN
INTRAMUSCULAR | Status: DC | PRN
  Administered 2015-11-29 (×2): 3 mg via INTRAVENOUS
  Administered 2015-11-29: 1 mg via INTRAVENOUS

## 2015-11-29 MED ORDER — BISACODYL 5 MG PO TBEC
10.0000 mg | DELAYED_RELEASE_TABLET | Freq: Every day | ORAL | Status: DC
  Administered 2015-11-30 – 2015-12-03 (×4): 10 mg via ORAL
  Filled 2015-11-29 (×4): qty 2

## 2015-11-29 MED ORDER — ORAL CARE MOUTH RINSE
15.0000 mL | Freq: Two times a day (BID) | OROMUCOSAL | Status: DC
  Administered 2015-11-30 – 2015-12-02 (×4): 15 mL via OROMUCOSAL

## 2015-11-29 MED ORDER — THROMBIN 20000 UNITS EX SOLR
CUTANEOUS | Status: AC
  Filled 2015-11-29: qty 20000

## 2015-11-29 MED ORDER — ORAL CARE MOUTH RINSE
15.0000 mL | Freq: Four times a day (QID) | OROMUCOSAL | Status: DC
  Administered 2015-11-29: 15 mL via OROMUCOSAL

## 2015-11-29 MED ORDER — PANTOPRAZOLE SODIUM 40 MG PO TBEC
40.0000 mg | DELAYED_RELEASE_TABLET | Freq: Every day | ORAL | Status: DC
  Administered 2015-12-01 – 2015-12-03 (×3): 40 mg via ORAL
  Filled 2015-11-29 (×3): qty 1

## 2015-11-29 MED ORDER — GELATIN ABSORBABLE MT POWD
OROMUCOSAL | Status: DC | PRN
  Administered 2015-11-29: 12 mL via TOPICAL

## 2015-11-29 MED ORDER — HEPARIN SODIUM (PORCINE) 1000 UNIT/ML IJ SOLN
INTRAMUSCULAR | Status: DC | PRN
  Administered 2015-11-29: 30000 [IU] via INTRAVENOUS

## 2015-11-29 MED ORDER — SODIUM CHLORIDE 0.9 % IV SOLN
INTRAVENOUS | Status: DC | PRN
  Administered 2015-11-29: 1 [IU]/h via INTRAVENOUS

## 2015-11-29 MED ORDER — PROPOFOL 10 MG/ML IV BOLUS
INTRAVENOUS | Status: AC
Start: 2015-11-29 — End: 2015-11-29
  Filled 2015-11-29: qty 20

## 2015-11-29 MED ORDER — HEMOSTATIC AGENTS (NO CHARGE) OPTIME
TOPICAL | Status: DC | PRN
  Administered 2015-11-29: 1 via TOPICAL

## 2015-11-29 MED ORDER — HEPARIN SODIUM (PORCINE) 1000 UNIT/ML IJ SOLN
INTRAMUSCULAR | Status: AC
  Filled 2015-11-29: qty 1

## 2015-11-29 MED ORDER — MILRINONE LACTATE IN DEXTROSE 20-5 MG/100ML-% IV SOLN
0.3750 ug/kg/min | INTRAVENOUS | Status: AC
  Administered 2015-11-29: .33 ug/kg/min via INTRAVENOUS
  Filled 2015-11-29: qty 100

## 2015-11-29 MED ORDER — LACTATED RINGERS IV SOLN
INTRAVENOUS | Status: DC | PRN
  Administered 2015-11-29: 07:00:00 via INTRAVENOUS

## 2015-11-29 MED ORDER — ATROPINE SULFATE 1 MG/ML IJ SOLN
INTRAMUSCULAR | Status: AC
  Filled 2015-11-29: qty 1

## 2015-11-29 MED ORDER — DOPAMINE-DEXTROSE 3.2-5 MG/ML-% IV SOLN
0.0000 ug/kg/min | INTRAVENOUS | Status: DC
  Administered 2015-11-29 (×3): 2 ug/kg/min via INTRAVENOUS
  Filled 2015-11-29: qty 250

## 2015-11-29 MED ORDER — PHENYLEPHRINE 40 MCG/ML (10ML) SYRINGE FOR IV PUSH (FOR BLOOD PRESSURE SUPPORT)
PREFILLED_SYRINGE | INTRAVENOUS | Status: AC
  Filled 2015-11-29: qty 10

## 2015-11-29 MED ORDER — PROTAMINE SULFATE 10 MG/ML IV SOLN
INTRAVENOUS | Status: AC
  Filled 2015-11-29: qty 25

## 2015-11-29 MED ORDER — LACTATED RINGERS IV SOLN
INTRAVENOUS | Status: DC
  Administered 2015-11-30: 09:00:00 via INTRAVENOUS

## 2015-11-29 MED ORDER — DOCUSATE SODIUM 100 MG PO CAPS
200.0000 mg | ORAL_CAPSULE | Freq: Every day | ORAL | Status: DC
  Administered 2015-11-30 – 2015-12-03 (×4): 200 mg via ORAL
  Filled 2015-11-29 (×4): qty 2

## 2015-11-29 MED ORDER — ALBUMIN HUMAN 5 % IV SOLN
250.0000 mL | INTRAVENOUS | Status: AC | PRN
  Administered 2015-11-29 (×4): 250 mL via INTRAVENOUS
  Filled 2015-11-29: qty 250

## 2015-11-29 MED ORDER — ALBUMIN HUMAN 5 % IV SOLN
250.0000 mL | INTRAVENOUS | Status: AC | PRN
  Administered 2015-11-29: 250 mL via INTRAVENOUS

## 2015-11-29 MED ORDER — ACETAMINOPHEN 160 MG/5ML PO SOLN: 1000.0000 mg | Freq: Four times a day (QID) | ORAL | Status: AC

## 2015-11-29 MED ORDER — TRANEXAMIC ACID 1000 MG/10ML IV SOLN
INTRAVENOUS | Status: DC | PRN
  Administered 2015-11-29: 1440 mg via INTRAVENOUS

## 2015-11-29 MED ORDER — POTASSIUM CHLORIDE 10 MEQ/50ML IV SOLN: 10.0000 meq | INTRAVENOUS | Status: AC

## 2015-11-29 MED ORDER — INSULIN REGULAR BOLUS VIA INFUSION
0.0000 [IU] | Freq: Three times a day (TID) | INTRAVENOUS | Status: DC
  Filled 2015-11-29: qty 10

## 2015-11-29 MED ORDER — CHLORHEXIDINE GLUCONATE 0.12% ORAL RINSE (MEDLINE KIT)
15.0000 mL | Freq: Two times a day (BID) | OROMUCOSAL | Status: DC
  Administered 2015-11-29: 15 mL via OROMUCOSAL

## 2015-11-29 MED ORDER — FENTANYL CITRATE (PF) 250 MCG/5ML IJ SOLN
INTRAMUSCULAR | Status: AC
  Filled 2015-11-29: qty 5

## 2015-11-29 MED ORDER — MILRINONE LACTATE IN DEXTROSE 20-5 MG/100ML-% IV SOLN
0.2500 ug/kg/min | INTRAVENOUS | Status: DC
  Administered 2015-11-29 – 2015-11-30 (×3): 0.33 ug/kg/min via INTRAVENOUS
  Administered 2015-11-30 – 2015-12-01 (×2): 0.25 ug/kg/min via INTRAVENOUS
  Filled 2015-11-29 (×4): qty 100

## 2015-11-29 MED ORDER — ROCURONIUM BROMIDE 10 MG/ML (PF) SYRINGE
PREFILLED_SYRINGE | INTRAVENOUS | Status: AC
  Filled 2015-11-29: qty 10

## 2015-11-29 MED ORDER — OXYCODONE HCL 5 MG PO TABS: 5.0000 mg | ORAL_TABLET | ORAL | Status: DC | PRN

## 2015-11-29 MED ORDER — METOPROLOL TARTRATE 5 MG/5ML IV SOLN: 2.5000 mg | INTRAVENOUS | Status: DC | PRN

## 2015-11-29 MED ORDER — ROCURONIUM BROMIDE 100 MG/10ML IV SOLN
INTRAVENOUS | Status: DC | PRN
  Administered 2015-11-29: 60 mg via INTRAVENOUS
  Administered 2015-11-29: 40 mg via INTRAVENOUS
  Administered 2015-11-29: 100 mg via INTRAVENOUS

## 2015-11-29 MED ORDER — PHENYLEPHRINE HCL 10 MG/ML IJ SOLN
0.0000 ug/min | INTRAVENOUS | Status: DC
  Administered 2015-11-29: 50 ug/min via INTRAVENOUS
  Filled 2015-11-29 (×4): qty 2

## 2015-11-29 MED ORDER — 0.9 % SODIUM CHLORIDE (POUR BTL) OPTIME
TOPICAL | Status: DC | PRN
  Administered 2015-11-29: 5000 mL

## 2015-11-29 MED ORDER — PHENYLEPHRINE HCL 10 MG/ML IJ SOLN
INTRAVENOUS | Status: DC | PRN
  Administered 2015-11-29: 25 ug/min via INTRAVENOUS

## 2015-11-29 MED ORDER — EPHEDRINE 5 MG/ML INJ
INTRAVENOUS | Status: AC
  Filled 2015-11-29: qty 10

## 2015-11-29 MED ORDER — METOPROLOL TARTRATE 25 MG/10 ML ORAL SUSPENSION: 12.5000 mg | Freq: Two times a day (BID) | ORAL | Status: DC

## 2015-11-29 MED ORDER — ACETAMINOPHEN 160 MG/5ML PO SOLN
650.0000 mg | Freq: Once | ORAL | Status: AC
Start: 2015-11-29 — End: 2015-11-29

## 2015-11-29 MED ORDER — GLYCOPYRROLATE 0.2 MG/ML IV SOSY
PREFILLED_SYRINGE | INTRAVENOUS | Status: AC
  Filled 2015-11-29: qty 3

## 2015-11-29 MED ORDER — LACTATED RINGERS IV SOLN: INTRAVENOUS | Status: DC

## 2015-11-29 MED ORDER — VANCOMYCIN HCL 1000 MG IV SOLR
INTRAVENOUS | Status: DC | PRN
  Administered 2015-11-29: 1500 mg via INTRAVENOUS

## 2015-11-29 MED ORDER — SODIUM CHLORIDE 0.9% FLUSH: 3.0000 mL | INTRAVENOUS | Status: DC | PRN

## 2015-11-29 MED ORDER — ACETAMINOPHEN 500 MG PO TABS
1000.0000 mg | ORAL_TABLET | Freq: Four times a day (QID) | ORAL | Status: AC
  Administered 2015-11-30 – 2015-12-04 (×18): 1000 mg via ORAL
  Filled 2015-11-29 (×18): qty 2

## 2015-11-29 MED ORDER — FENTANYL CITRATE (PF) 250 MCG/5ML IJ SOLN
INTRAMUSCULAR | Status: AC
  Filled 2015-11-29: qty 10

## 2015-11-29 MED ORDER — METOPROLOL TARTRATE 12.5 MG HALF TABLET
12.5000 mg | ORAL_TABLET | Freq: Two times a day (BID) | ORAL | Status: DC
  Administered 2015-11-30: 12.5 mg via ORAL
  Filled 2015-11-29: qty 1

## 2015-11-29 MED ORDER — MIDAZOLAM HCL 10 MG/2ML IJ SOLN
INTRAMUSCULAR | Status: AC
  Filled 2015-11-29: qty 2

## 2015-11-29 MED ORDER — NITROGLYCERIN IN D5W 200-5 MCG/ML-% IV SOLN: 0.0000 ug/min | INTRAVENOUS | Status: DC

## 2015-11-29 MED ORDER — SODIUM CHLORIDE 0.9 % IJ SOLN
INTRAMUSCULAR | Status: AC
  Filled 2015-11-29: qty 10

## 2015-11-29 MED ORDER — VANCOMYCIN HCL IN DEXTROSE 1-5 GM/200ML-% IV SOLN
1000.0000 mg | Freq: Once | INTRAVENOUS | Status: AC
  Administered 2015-11-29: 1000 mg via INTRAVENOUS
  Filled 2015-11-29 (×2): qty 200

## 2015-11-29 MED ORDER — INSULIN REGULAR HUMAN 100 UNIT/ML IJ SOLN
INTRAMUSCULAR | Status: DC
  Administered 2015-11-29: 18:00:00 via INTRAVENOUS
  Administered 2015-11-29: 0.8 [IU]/h via INTRAVENOUS
  Filled 2015-11-29 (×2): qty 2.5

## 2015-11-29 MED ORDER — SODIUM CHLORIDE 0.9% FLUSH
3.0000 mL | Freq: Two times a day (BID) | INTRAVENOUS | Status: DC
  Administered 2015-11-30 – 2015-12-04 (×9): 3 mL via INTRAVENOUS

## 2015-11-29 MED ORDER — ASPIRIN EC 325 MG PO TBEC
325.0000 mg | DELAYED_RELEASE_TABLET | Freq: Every day | ORAL | Status: DC
  Administered 2015-11-30 – 2015-12-03 (×4): 325 mg via ORAL
  Filled 2015-11-29 (×4): qty 1

## 2015-11-29 MED ORDER — ETOMIDATE 2 MG/ML IV SOLN
INTRAVENOUS | Status: AC
  Filled 2015-11-29: qty 10

## 2015-11-29 MED ORDER — DOPAMINE-DEXTROSE 3.2-5 MG/ML-% IV SOLN: 0.0000 ug/kg/min | INTRAVENOUS | Status: DC

## 2015-11-29 MED ORDER — MORPHINE SULFATE (PF) 2 MG/ML IV SOLN
2.0000 mg | INTRAVENOUS | Status: DC | PRN
  Administered 2015-11-29 – 2015-11-30 (×2): 2 mg via INTRAVENOUS
  Filled 2015-11-29 (×2): qty 1

## 2015-11-29 MED ORDER — SODIUM CHLORIDE 0.45 % IV SOLN
INTRAVENOUS | Status: DC | PRN
  Administered 2015-11-29: 14:00:00 via INTRAVENOUS

## 2015-11-29 MED ORDER — NOREPINEPHRINE BITARTRATE 1 MG/ML IV SOLN
0.0000 ug/min | INTRAVENOUS | Status: DC
  Administered 2015-11-29: 6 ug/min via INTRAVENOUS
  Filled 2015-11-29: qty 4

## 2015-11-29 MED ORDER — SODIUM CHLORIDE 0.9 % IV SOLN
INTRAVENOUS | Status: DC
  Administered 2015-12-01: 20 mL/h via INTRAVENOUS

## 2015-11-29 MED ORDER — CALCIUM CHLORIDE 10 % IV SOLN
1.0000 g | Freq: Once | INTRAVENOUS | Status: AC
  Administered 2015-11-29: 1 g via INTRAVENOUS

## 2015-11-29 MED ORDER — TRAMADOL HCL 50 MG PO TABS: 50.0000 mg | ORAL_TABLET | ORAL | Status: DC | PRN

## 2015-11-29 MED ORDER — FENTANYL CITRATE (PF) 250 MCG/5ML IJ SOLN
INTRAMUSCULAR | Status: DC | PRN
  Administered 2015-11-29: 50 ug via INTRAVENOUS
  Administered 2015-11-29 (×3): 250 ug via INTRAVENOUS
  Administered 2015-11-29: 50 ug via INTRAVENOUS
  Administered 2015-11-29 (×4): 100 ug via INTRAVENOUS
  Administered 2015-11-29: 250 ug via INTRAVENOUS

## 2015-11-29 MED ORDER — SODIUM CHLORIDE 0.9 % IV SOLN: 250.0000 mL | INTRAVENOUS | Status: DC

## 2015-11-29 MED ORDER — DEXMEDETOMIDINE HCL IN NACL 200 MCG/50ML IV SOLN
INTRAVENOUS | Status: DC | PRN
  Administered 2015-11-29: .3 ug/kg/h via INTRAVENOUS

## 2015-11-29 MED ORDER — MAGNESIUM SULFATE 4 GM/100ML IV SOLN
4.0000 g | Freq: Once | INTRAVENOUS | Status: AC
  Administered 2015-11-29: 4 g via INTRAVENOUS
  Filled 2015-11-29: qty 100

## 2015-11-29 MED ORDER — ASPIRIN 81 MG PO CHEW: 324.0000 mg | CHEWABLE_TABLET | Freq: Every day | ORAL | Status: DC

## 2015-11-29 MED ORDER — SODIUM CHLORIDE 0.9 % IV SOLN: Freq: Once | INTRAVENOUS | Status: DC

## 2015-11-29 MED ORDER — LACTATED RINGERS IV SOLN: 500.0000 mL | Freq: Once | INTRAVENOUS | Status: DC | PRN

## 2015-11-29 MED ORDER — ACETAMINOPHEN 650 MG RE SUPP
650.0000 mg | Freq: Once | RECTAL | Status: AC
  Administered 2015-11-29: 650 mg via RECTAL

## 2015-11-29 MED ORDER — MORPHINE SULFATE (PF) 2 MG/ML IV SOLN: 1.0000 mg | INTRAVENOUS | Status: AC | PRN

## 2015-11-29 MED ORDER — LIDOCAINE 2% (20 MG/ML) 5 ML SYRINGE
INTRAMUSCULAR | Status: AC
  Filled 2015-11-29: qty 5

## 2015-11-29 MED ORDER — ONDANSETRON HCL 4 MG/2ML IJ SOLN: 4.0000 mg | Freq: Four times a day (QID) | INTRAMUSCULAR | Status: DC | PRN

## 2015-11-29 MED ORDER — DEXMEDETOMIDINE HCL IN NACL 200 MCG/50ML IV SOLN
0.0000 ug/kg/h | INTRAVENOUS | Status: DC
  Administered 2015-11-29: 0.7 ug/kg/h via INTRAVENOUS
  Filled 2015-11-29 (×2): qty 50

## 2015-11-29 MED ORDER — CHLORHEXIDINE GLUCONATE 0.12 % MT SOLN
15.0000 mL | OROMUCOSAL | Status: AC
  Administered 2015-11-29: 15 mL via OROMUCOSAL

## 2015-11-29 MED ORDER — PROTAMINE SULFATE 10 MG/ML IV SOLN
INTRAVENOUS | Status: DC | PRN
  Administered 2015-11-29: 300 mg via INTRAVENOUS

## 2015-11-29 MED ORDER — DEXTROSE 5 % IV SOLN
1.5000 g | Freq: Two times a day (BID) | INTRAVENOUS | Status: AC
  Administered 2015-11-29 – 2015-12-01 (×4): 1.5 g via INTRAVENOUS
  Filled 2015-11-29 (×4): qty 1.5

## 2015-11-29 MED ORDER — MIDAZOLAM HCL 2 MG/2ML IJ SOLN: 2.0000 mg | INTRAMUSCULAR | Status: DC | PRN

## 2015-11-29 MED ORDER — BISACODYL 10 MG RE SUPP
10.0000 mg | Freq: Every day | RECTAL | Status: DC
  Filled 2015-11-29: qty 1

## 2015-11-29 MED ORDER — FAMOTIDINE IN NACL 20-0.9 MG/50ML-% IV SOLN
20.0000 mg | Freq: Two times a day (BID) | INTRAVENOUS | Status: AC
  Administered 2015-11-29: 20 mg via INTRAVENOUS

## 2015-11-29 MED ORDER — PROTAMINE SULFATE 10 MG/ML IV SOLN
INTRAVENOUS | Status: AC
  Filled 2015-11-29: qty 5

## 2015-11-29 MED ORDER — SUCCINYLCHOLINE CHLORIDE 200 MG/10ML IV SOSY
PREFILLED_SYRINGE | INTRAVENOUS | Status: AC
Start: 2015-11-29 — End: 2015-11-29
  Filled 2015-11-29: qty 10

## 2015-11-29 MED ORDER — ALBUMIN HUMAN 5 % IV SOLN
INTRAVENOUS | Status: DC | PRN
  Administered 2015-11-29 (×2): via INTRAVENOUS

## 2015-11-29 MED FILL — Potassium Chloride Inj 2 mEq/ML: INTRAVENOUS | Qty: 40 | Status: AC

## 2015-11-29 MED FILL — Magnesium Sulfate Inj 50%: INTRAMUSCULAR | Qty: 10 | Status: AC

## 2015-11-29 MED FILL — Heparin Sodium (Porcine) Inj 1000 Unit/ML: INTRAMUSCULAR | Qty: 30 | Status: AC

## 2015-11-29 SURGICAL SUPPLY — 106 items
ADAPTER CARDIO PERF ANTE/RETRO (ADAPTER) ×5 IMPLANT
BAG DECANTER FOR FLEXI CONT (MISCELLANEOUS) ×5 IMPLANT
BANDAGE ACE 4X5 VEL STRL LF (GAUZE/BANDAGES/DRESSINGS) ×5 IMPLANT
BANDAGE ACE 6X5 VEL STRL LF (GAUZE/BANDAGES/DRESSINGS) ×5 IMPLANT
BASKET HEART  (ORDER IN 25'S) (MISCELLANEOUS) ×1
BASKET HEART (ORDER IN 25'S) (MISCELLANEOUS) ×1
BASKET HEART (ORDER IN 25S) (MISCELLANEOUS) ×3 IMPLANT
BLADE STERNUM SYSTEM 6 (BLADE) ×5 IMPLANT
BLADE SURG 11 STRL SS (BLADE) ×5 IMPLANT
BNDG GAUZE ELAST 4 BULKY (GAUZE/BANDAGES/DRESSINGS) ×5 IMPLANT
CANISTER SUCTION 2500CC (MISCELLANEOUS) ×5 IMPLANT
CANNULA GUNDRY RCSP 15FR (MISCELLANEOUS) ×5 IMPLANT
CATH ROBINSON RED A/P 18FR (CATHETERS) ×15 IMPLANT
CATH THORACIC 28FR (CATHETERS) ×5 IMPLANT
CATH THORACIC 36FR (CATHETERS) ×5 IMPLANT
CATH THORACIC 36FR RT ANG (CATHETERS) ×5 IMPLANT
CLIP TI MEDIUM 24 (CLIP) IMPLANT
CLIP TI WIDE RED SMALL 24 (CLIP) ×10 IMPLANT
CRADLE DONUT ADULT HEAD (MISCELLANEOUS) ×5 IMPLANT
DERMABOND ADVANCED (GAUZE/BANDAGES/DRESSINGS) ×2
DERMABOND ADVANCED .7 DNX12 (GAUZE/BANDAGES/DRESSINGS) ×3 IMPLANT
DRAPE CARDIOVASCULAR INCISE (DRAPES) ×2
DRAPE SLUSH/WARMER DISC (DRAPES) ×5 IMPLANT
DRAPE SRG 135X102X78XABS (DRAPES) ×3 IMPLANT
DRSG COVADERM 4X14 (GAUZE/BANDAGES/DRESSINGS) ×5 IMPLANT
ELECT CAUTERY BLADE 6.4 (BLADE) ×5 IMPLANT
ELECT REM PT RETURN 9FT ADLT (ELECTROSURGICAL) ×10
ELECTRODE REM PT RTRN 9FT ADLT (ELECTROSURGICAL) ×6 IMPLANT
FELT TEFLON 1X6 (MISCELLANEOUS) ×10 IMPLANT
GAUZE SPONGE 4X4 12PLY STRL (GAUZE/BANDAGES/DRESSINGS) ×10 IMPLANT
GLOVE BIO SURGEON STRL SZ 6 (GLOVE) IMPLANT
GLOVE BIO SURGEON STRL SZ 6.5 (GLOVE) ×8 IMPLANT
GLOVE BIO SURGEON STRL SZ7 (GLOVE) IMPLANT
GLOVE BIO SURGEON STRL SZ7.5 (GLOVE) IMPLANT
GLOVE BIO SURGEONS STRL SZ 6.5 (GLOVE) ×2
GLOVE BIOGEL PI IND STRL 6 (GLOVE) IMPLANT
GLOVE BIOGEL PI IND STRL 6.5 (GLOVE) ×12 IMPLANT
GLOVE BIOGEL PI IND STRL 7.0 (GLOVE) IMPLANT
GLOVE BIOGEL PI INDICATOR 6 (GLOVE)
GLOVE BIOGEL PI INDICATOR 6.5 (GLOVE) ×8
GLOVE BIOGEL PI INDICATOR 7.0 (GLOVE)
GLOVE EUDERMIC 7 POWDERFREE (GLOVE) ×10 IMPLANT
GLOVE ORTHO TXT STRL SZ7.5 (GLOVE) IMPLANT
GOWN STRL REUS W/ TWL LRG LVL3 (GOWN DISPOSABLE) ×21 IMPLANT
GOWN STRL REUS W/ TWL XL LVL3 (GOWN DISPOSABLE) ×6 IMPLANT
GOWN STRL REUS W/TWL LRG LVL3 (GOWN DISPOSABLE) ×14
GOWN STRL REUS W/TWL XL LVL3 (GOWN DISPOSABLE) ×4
HEMOSTAT POWDER SURGIFOAM 1G (HEMOSTASIS) ×15 IMPLANT
HEMOSTAT SURGICEL 2X14 (HEMOSTASIS) ×5 IMPLANT
INSERT FOGARTY 61MM (MISCELLANEOUS) IMPLANT
INSERT FOGARTY XLG (MISCELLANEOUS) IMPLANT
KIT BASIN OR (CUSTOM PROCEDURE TRAY) ×5 IMPLANT
KIT CATH CPB BARTLE (MISCELLANEOUS) ×5 IMPLANT
KIT ROOM TURNOVER OR (KITS) ×5 IMPLANT
KIT SUCTION CATH 14FR (SUCTIONS) ×5 IMPLANT
KIT VASOVIEW HEMOPRO VH 3000 (KITS) ×5 IMPLANT
NS IRRIG 1000ML POUR BTL (IV SOLUTION) ×25 IMPLANT
PACK OPEN HEART (CUSTOM PROCEDURE TRAY) ×5 IMPLANT
PAD ARMBOARD 7.5X6 YLW CONV (MISCELLANEOUS) ×10 IMPLANT
PAD ELECT DEFIB RADIOL ZOLL (MISCELLANEOUS) ×5 IMPLANT
PENCIL BUTTON HOLSTER BLD 10FT (ELECTRODE) ×5 IMPLANT
PUNCH AORTIC ROTATE 4.0MM (MISCELLANEOUS) IMPLANT
PUNCH AORTIC ROTATE 4.5MM 8IN (MISCELLANEOUS) ×5 IMPLANT
PUNCH AORTIC ROTATE 5MM 8IN (MISCELLANEOUS) IMPLANT
SET CARDIOPLEGIA MPS 5001102 (MISCELLANEOUS) ×5 IMPLANT
SPONGE GAUZE 4X4 12PLY STER LF (GAUZE/BANDAGES/DRESSINGS) IMPLANT
SPONGE INTESTINAL PEANUT (DISPOSABLE) IMPLANT
SPONGE LAP 18X18 X RAY DECT (DISPOSABLE) ×5 IMPLANT
SPONGE LAP 4X18 X RAY DECT (DISPOSABLE) ×5 IMPLANT
SUT BONE WAX W31G (SUTURE) ×5 IMPLANT
SUT MNCRL AB 4-0 PS2 18 (SUTURE) ×5 IMPLANT
SUT PROLENE 3 0 SH DA (SUTURE) IMPLANT
SUT PROLENE 3 0 SH1 36 (SUTURE) ×5 IMPLANT
SUT PROLENE 4 0 RB 1 (SUTURE) ×4
SUT PROLENE 4 0 SH DA (SUTURE) IMPLANT
SUT PROLENE 4-0 RB1 .5 CRCL 36 (SUTURE) ×6 IMPLANT
SUT PROLENE 5 0 C 1 36 (SUTURE) IMPLANT
SUT PROLENE 6 0 C 1 30 (SUTURE) ×15 IMPLANT
SUT PROLENE 7 0 BV 1 (SUTURE) IMPLANT
SUT PROLENE 7 0 BV1 MDA (SUTURE) ×10 IMPLANT
SUT PROLENE 8 0 BV175 6 (SUTURE) ×15 IMPLANT
SUT SILK  1 MH (SUTURE) ×6
SUT SILK 1 MH (SUTURE) ×9 IMPLANT
SUT STEEL STERNAL CCS#1 18IN (SUTURE) IMPLANT
SUT STEEL SZ 6 DBL 3X14 BALL (SUTURE) ×15 IMPLANT
SUT TEM PAC WIRE 2 0 SH (SUTURE) ×5 IMPLANT
SUT VIC AB 1 CTX 36 (SUTURE) ×6
SUT VIC AB 1 CTX36XBRD ANBCTR (SUTURE) ×9 IMPLANT
SUT VIC AB 2-0 CT1 27 (SUTURE) ×4
SUT VIC AB 2-0 CT1 TAPERPNT 27 (SUTURE) ×6 IMPLANT
SUT VIC AB 2-0 CTX 27 (SUTURE) IMPLANT
SUT VIC AB 3-0 SH 27 (SUTURE)
SUT VIC AB 3-0 SH 27X BRD (SUTURE) IMPLANT
SUT VIC AB 3-0 X1 27 (SUTURE) IMPLANT
SUT VICRYL 4-0 PS2 18IN ABS (SUTURE) IMPLANT
SUTURE E-PAK OPEN HEART (SUTURE) ×5 IMPLANT
SYSTEM SAHARA CHEST DRAIN ATS (WOUND CARE) ×5 IMPLANT
TAPE CLOTH SURG 4X10 WHT LF (GAUZE/BANDAGES/DRESSINGS) ×5 IMPLANT
TOWEL OR 17X24 6PK STRL BLUE (TOWEL DISPOSABLE) ×10 IMPLANT
TOWEL OR 17X26 10 PK STRL BLUE (TOWEL DISPOSABLE) ×5 IMPLANT
TRAY CATH LUMEN 1 20CM STRL (SET/KITS/TRAYS/PACK) ×5 IMPLANT
TRAY FOLEY IC TEMP SENS 16FR (CATHETERS) ×5 IMPLANT
TUBING ART PRESS 48 MALE/FEM (TUBING) ×10 IMPLANT
TUBING INSUFFLATION (TUBING) ×5 IMPLANT
UNDERPAD 30X30 (UNDERPADS AND DIAPERS) ×5 IMPLANT
WATER STERILE IRR 1000ML POUR (IV SOLUTION) ×10 IMPLANT

## 2015-11-29 NOTE — Anesthesia Procedure Notes (Addendum)
Central Venous Catheter Insertion Performed by: anesthesiologist 11/29/2015 7:16 AM Patient location: Pre-op. Preanesthetic checklist: patient identified, IV checked, site marked, risks and benefits discussed, surgical consent, monitors and equipment checked, pre-op evaluation, timeout performed and anesthesia consent Position: Trendelenburg Lidocaine 1% used for infiltration Landmarks identified and Seldinger technique used Catheter size: 9 Fr Central line and PA cath was placed.MAC introducer Swan type and PA catheter depth:thermodilution and 50PA Cath depth:50 Procedure performed using ultrasound guided technique. Attempts: 1 Following insertion, line sutured and dressing applied. Post procedure assessment: blood return through all ports, free fluid flow and no air. Patient tolerated the procedure well with no immediate complications.

## 2015-11-29 NOTE — Progress Notes (Signed)
Pt placed on CPAP mode.  1820 ABG results: pH 7.31, pCO2 45.8, pO2 124. HCO3 23.3. Mechanics attempted and unsuccessful.  Placed back on full support. Will try again when more alert.

## 2015-11-29 NOTE — Progress Notes (Signed)
Rapid Wean Protocol begun, patient tolerating well and RT and RN at bedside.

## 2015-11-29 NOTE — Progress Notes (Signed)
      [address]       ,[place] [address]             [phone]      Intubated  BP 94/74 (BP Location: Right Arm)   Pulse 93   Temp 98.1 F (36.7 C) (Core (Comment))   Resp 14   Ht 6\' 3"  (1.905 m)   Wt 212 lb 3.2 oz (96.3 kg) Comment: scale a  SpO2 100%   BMI 26.52 kg/m    Intake/Output Summary (Last 24 hours) at 11/29/15 1948 Last data filed at 11/29/15 1900  Gross per 24 hour  Intake           4587.7 ml  Output             3505 ml  Net           1082.7 ml   Starting to wean vent  Remo Lipps C. Roxan Hockey, MD [facility] [phone]

## 2015-11-29 NOTE — Progress Notes (Signed)
   70 y/o male with EF 20% and severe 3vCAD.  Underwent CABG today. Swan numbers reviewed. Hemodynamics tenuous but improving.   The HF team will help manage post-operatively.   Bensimhon, Daniel,MD 10:22 PM

## 2015-11-29 NOTE — Progress Notes (Signed)
  Echocardiogram Echocardiogram Transesophageal has been performed.  Jennette Dubin 11/29/2015, 9:44 AM

## 2015-11-29 NOTE — Progress Notes (Signed)
Patient is on CPAP/PS now and tolerating well. RT will continue to monitor at bedside.

## 2015-11-29 NOTE — Op Note (Signed)
CARDIOVASCULAR SURGERY OPERATIVE NOTE  11/29/2015   Surgeon:  Gaye Pollack, MD  First Assistant: Lars Pinks,  PA-C   Preoperative Diagnosis:  Severe multi-vessel coronary artery disease with severe LV dysfunction   Postoperative Diagnosis:  Same   Procedure:  1. Median Sternotomy 2. Extracorporeal circulation 3.   Coronary artery bypass grafting x 4   Left internal mammary graft to the LAD  Sequential SVG to second diagonal and OM  SVG to PDA  4.   Endoscopic vein harvest from the right leg 5.   Insertion of left femoral arterial line   Anesthesia:  General Endotracheal   Clinical History/Surgical Indication:  The patient is a 70 year old gentleman with a history of hyperlipidemia and prostate cancer s/p treatment 15 years ago who reports being in his usual good state of health until 3-4 weeks prior to admission when he noticed increasing shortness of breath and non-productive cough. He was seen by an outside physician and diagnosed with pneumonia. He was treated with Levaquin but did not improve and subsequently developed progressive lower extremity swelling, an 8 lb weight gain, and 2 pillow orthopnea. He presented to [facility] and his BNP was 1125 with a peak troponin of 0.04. CXR showed cardiomegaly with pulmonary vascular congestion. He was diuresed with significant improvement in his edema and symptoms. An echo showed severe LV dysfunction with an EF < 20%, moderate MR, a dilated LV, mild RV systolic dysfunction, PASP elevated at 52 mm Hg. Cardiac cath shows severe 3-vessel coronary artery disease with 90% proximal LAD, occlusion of a large OM with collaterals filling the distal vessel and an occluded proximal RCA with collaterals filling the PDA. LVEF was 20% with elevated pulmonary artery pressures and a PCWP of 29. He was transferred to [facility] for further care. He denies any  prior cardiac history. He has had no chest pain or pressure. 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   I have personally reviewed and interpreted his echo and cath. He has severe multi-vessel coronary disease with severe LV dysfunction due to ischemic cardiomyopathy. His LVEF is about 20% with a dilated LV and moderate MR. He presented with symptoms of congestive heart failure with no chest pain. I think he would benefit from CABG to prevent further LV dysfunction and hopefully improve his LV function some. His LAD is certainly graftable. His OM may be graftable distally but is likely intramyocardial and may not be graftable. The PDA is probably graftable. His operative risk is certainly increased due to his severe LV dysfunction and dilated LV. I discussed the operative procedure with the patient and family including alternatives, benefits and risks; including but not limited to bleeding, blood transfusion, infection, stroke, myocardial infarction, graft failure, heart block requiring a permanent pacemaker, organ dysfunction, and death.  [facility] understands and agrees to proceed.   Preparation:  The patient was seen in the preoperative holding area and the correct patient, correct operation were confirmed with the patient after reviewing the medical record and catheterization. The consent was signed by me. Preoperative antibiotics were given. A pulmonary arterial line and radial arterial line were placed by the anesthesia team. The patient was taken back to the operating room and positioned supine on the operating room table. After being placed under general endotracheal anesthesia by the anesthesia team a foley catheter was placed. The neck, chest, abdomen, and both legs were prepped with betadine soap and solution and draped in the usual sterile manner. A surgical  time-out was taken and the correct patient and operative procedure were confirmed with the nursing and anesthesia  staff.  TEE: performed by Dr. Suann Larry  Conclusions   Result status: Final result   Left ventricle: Concentric hypertrophy. LV systolic function is [id number]. Wall motion is abnormal. Global hypokinesis No thrombus present. No mass present.  Septum: No Patent Foramen Ovale present.  Left atrium: Patent foramen ovale not present.  Aortic valve: The valve is trileaflet. No regurgitation. No AV vegetation.  Mitral valve: Mild leaflet thickening is present. Mild leaflet calcification is present. Mild mitral annular calcification. Mild regurgitation.  Right ventricle: Cavity is mildly to moderately dilated. Moderately reduced systolic function. No thrombus present. No mass present.  Tricuspid valve: Trace regurgitation.  Pulmonic valve: Trace regurgitation.     Insertion of left femoral arterial line:  The left common femoral vein was punctured with a need and a guidewire advanced without difficulty. The track was dilated and a long arterial catheter was inserted over the guidewire. It was connected to a pressure transducer and was giving a good arterial pressure tracing. It was sutured to the skin.  Cardiopulmonary Bypass:  A median sternotomy was performed. The pericardium was opened in the midline. Right ventricular function appeared markedly hypokinetic. The ascending aorta was of normal size and had no palpable plaque. There were no contraindications to aortic cannulation or cross-clamping. The patient was fully systemically heparinized and the ACT was maintained > 400 sec. The proximal aortic arch was cannulated with a 20 F aortic cannula for arterial inflow. Venous cannulation was performed via the right atrial appendage using a two-staged venous cannula. An antegrade cardioplegia/vent cannula was inserted into the mid-ascending aorta. A retrograde cardioplegia cannula was inserted into the coronary sinus via the right atrium. Aortic occlusion was performed with a single  cross-clamp. Systemic cooling to 32 degrees Centigrade and topical cooling of the heart with iced saline were used. Hyperkalemic antegrade and retrograde cold blood cardioplegia was used to induce diastolic arrest and was then given at about 20 minute intervals throughout the period of arrest to maintain myocardial temperature at or below 10 degrees centigrade. A temperature probe was inserted into the interventricular septum and an insulating pad was placed in the pericardium.   Left internal mammary harvest:  The left side of the sternum was retracted using the Rultract retractor. The left internal mammary artery was harvested as a pedicle graft. All side branches were clipped. It was a medium-sized vessel of good quality with excellent blood flow. It was ligated distally and divided. It was sprayed with topical papaverine solution to prevent vasospasm.   Endoscopic vein harvest:  The right greater saphenous vein was harvested endoscopically through a 2 cm incision medial to the right knee. It was harvested from the upper thigh to below the knee. It was a medium-sized vein of good quality in the thigh but then below the knee it branched into two small branches with varicosities that was unsuitable as conduit. The side branches were all ligated with 4-0 silk ties.    Coronary arteries:  The coronary arteries were examined.   LAD:  Diffusely diseased. The second diagonal was a medium caliber vessel with no distal disease.  LCX:  The OM was diffusely diseased but graftable distally just before the bifurcation. There was scar on the posterolateral wall.  RCA:  The RCA was diffusely diseased with calcific plaque. The PDA was diffusely diseased and borderline graftable but there was one soft area so I  decided to graft it.    Grafts:  1. LIMA to the LAD: 1.75 mm. It was sewn end to side using 8-0 prolene continuous suture. 2. Sequential SVG to Diagonal 2:  1.6 mm. It was sewn sequential side  to side using 7-0 prolene continuous suture. 3. Sequential SVG to OM:  1.75 mm. It was sewn sequential end to side using 7-0 prolene continuous suture. 4. SVG to PDA:  1.5 mm. It was sewn end to side using 7-0 prolene continuous suture.  The proximal vein graft anastomoses were performed to the mid-ascending aorta using continuous 6-0 prolene suture. Graft markers were placed around the proximal anastomoses.   Completion:  The patient was rewarmed to 37 degrees Centigrade. The clamp was removed from the LIMA pedicle and there was rapid warming of the septum and return of ventricular fibrillation. The crossclamp was removed with a time of 95 minutes. There was spontaneous return of sinus rhythm. The distal and proximal anastomoses were checked for hemostasis. The position of the grafts was satisfactory. Two temporary epicardial pacing wires were placed on the right atrium and two on the right ventricle. The patient was weaned from CPB without difficulty on milrinone 0.33 mcg/kg/min, dopamine 3, and levophed 4 mcg. CPB time was 121 minutes. Cardiac output was 5 LPM. TEE showed markedly improved RV function. The LV remained dilated but had improved contractility.  Heparin was fully reversed with protamine and the aortic and venous cannulas removed. Hemostasis was achieved. Mediastinal and left pleural drainage tubes were placed. The sternum was closed with double #6 stainless steel wires. The fascia was closed with continuous # 1 vicryl suture. The subcutaneous tissue was closed with 2-0 vicryl continuous suture. The skin was closed with 3-0 vicryl subcuticular suture. All sponge, needle, and instrument counts were reported correct at the end of the case. Dry sterile dressings were placed over the incisions and around the chest tubes which were connected to pleurevac suction. The patient was then transported to the surgical intensive care unit in critical but stable condition.

## 2015-11-29 NOTE — Anesthesia Procedure Notes (Signed)
Procedure Name: Intubation Date/Time: 11/29/2015 7:49 AM Performed by: Rebekah Chesterfield L Pre-anesthesia Checklist: Patient identified Patient Re-evaluated:Patient Re-evaluated prior to inductionOxygen Delivery Method: Circle system utilized Preoxygenation: Pre-oxygenation with 100% oxygen Intubation Type: IV induction Ventilation: Oral airway inserted - appropriate to patient size and Two handed mask ventilation required Laryngoscope Size: Miller and 2 Grade View: Grade I Tube type: Oral Tube size: 8.0 mm Number of attempts: 1 Airway Equipment and Method: Stylet Placement Confirmation: ETT inserted through vocal cords under direct vision,  positive ETCO2 and breath sounds checked- equal and bilateral Secured at: 23 cm Tube secured with: Tape Dental Injury: Teeth and Oropharynx as per pre-operative assessment

## 2015-11-29 NOTE — Transfer of Care (Signed)
Immediate Anesthesia Transfer of Care Note  Patient: [facility]  Procedure(s) Performed: Procedure(s): CORONARY ARTERY BYPASS GRAFTING (CABG) x four, using left internal mammary artery and right leg saphenous vein harvested endoscopically (N/A) TRANSESOPHAGEAL ECHOCARDIOGRAM (TEE) (N/A) LEFT FEMORAL ARTERIAL LINE INSERTION (Left)  Patient Location: SICU  Anesthesia Type:General  Level of Consciousness: sedated, responds to stimulation and Patient remains intubated per anesthesia plan  Airway & Oxygen Therapy: Patient remains intubated per anesthesia plan and Patient placed on Ventilator (see vital sign flow sheet for setting)  Post-op Assessment: Report given to RN, Post -op Vital signs reviewed and stable and Patient moving all extremities  Post vital signs: Reviewed and stable  Last Vitals:  Vitals:   11/29/15 0457 11/29/15 1356  BP: 112/73 104/76  Pulse: 92   Resp: 18 16  Temp: 36.6 C     Last Pain:  Vitals:   11/29/15 0457  TempSrc: Oral  PainSc:          Complications: No apparent anesthesia complications

## 2015-11-29 NOTE — Brief Op Note (Signed)
11/22/2015 - 11/29/2015  11:45 AM  PATIENT:  Shane Kim  70 y.o. male  PRE-OPERATIVE DIAGNOSIS:  Coronary Artery Disease  POST-OPERATIVE DIAGNOSIS:  Coronary Artery Disease  PROCEDURE:  TRANSESOPHAGEAL ECHOCARDIOGRAM (TEE), MEDIAN STERNOTOMY for CORONARY ARTERY BYPASS GRAFTING (CABG) x 4 (LIMA to LAD, SVG SEQUENTIALLY to DIAGONAL and OM, SVG to PDA) with EVH from [place], and LEFT FEMORAL ARTERIAL LINE INSERTION (Left)  SURGEON:  Surgeon(s) and Role:    * Gaye Pollack, MD - Primary  PHYSICIAN ASSISTANT: Lars Pinks PA-C  ANESTHESIA:   general  EBL:  Total I/O In: 500 [I.V.:250; IV Piggyback:250] Out: 300 [Urine:300]  DRAINS: Chest tubes placed in the mediastinal and pleural spaces   COUNTS CORRECT:  YES  DICTATION: .Dragon Dictation  PLAN OF CARE: Admit to inpatient   PATIENT DISPOSITION:  ICU - intubated and hemodynamically stable.   Delay start of Pharmacological VTE agent (>24hrs) due to surgical blood loss or risk of bleeding: yes  BASELINE WEIGHT: 96 kg

## 2015-11-29 NOTE — Progress Notes (Signed)
Starting rapid wean protocol. Pt flipped to 40/4 per RRT.

## 2015-11-29 NOTE — Anesthesia Postprocedure Evaluation (Signed)
Anesthesia Post Note  Patient: [name]  Procedure(s) Performed: Procedure(s) (LRB): CORONARY ARTERY BYPASS GRAFTING (CABG) x four, using left internal mammary artery and right leg saphenous vein harvested endoscopically (N/A) TRANSESOPHAGEAL ECHOCARDIOGRAM (TEE) (N/A) LEFT FEMORAL ARTERIAL LINE INSERTION (Left)  Patient location during evaluation: SICU Anesthesia Type: General Level of consciousness: sedated Pain management: pain level controlled Vital Signs Assessment: post-procedure vital signs reviewed and stable Respiratory status: patient remains intubated per anesthesia plan Cardiovascular status: stable Anesthetic complications: no    Last Vitals:  Vitals:   11/29/15 1445 11/29/15 1500  BP:    Pulse: (!) 120 (!) 119  Resp: 12 13  Temp: 36.3 C 36.2 C    Last Pain:  Vitals:   11/29/15 1400  TempSrc: Core (Comment)  PainSc:                  Shane Kim,Shane Kim

## 2015-11-29 NOTE — Procedures (Signed)
Extubation Procedure Note  Patient Details:   Name: Jahi Rister DOB: 29-Aug-1945 MRN: [id number]   Airway Documentation:  Airway 8 mm (Active)  Secured at (cm) 23 cm 11/29/2015  8:16 PM  Measured From Lips 11/29/2015  8:16 PM  Secured Location Right 11/29/2015  8:16 PM  Secured By [employer] Tape 11/29/2015  8:16 PM  Cuff Pressure (cm H2O) 28 cm H2O 11/29/2015  2:00 PM  Site Condition Dry 11/29/2015  8:16 PM    Evaluation  O2 sats: stable throughout Complications: No apparent complications Patient did tolerate procedure well. Bilateral Breath Sounds: Clear   Yes  Jori Moll 11/29/2015, 9:04 PM   Patient performed a NIF of -60, FVC of .9L and demonstrated a positive cuff leak prior to extubation. RT extubated patient to 4L nasal cannula and patient is tolerating it well. RT will monitor as needed.

## 2015-11-30 ENCOUNTER — Inpatient Hospital Stay (HOSPITAL_COMMUNITY): Payer: BLUE CROSS/BLUE SHIELD

## 2015-11-30 ENCOUNTER — Encounter (HOSPITAL_COMMUNITY): Payer: Self-pay | Admitting: Surgery

## 2015-11-30 DIAGNOSIS — Z951 Presence of aortocoronary bypass graft: Secondary | ICD-10-CM

## 2015-11-30 DIAGNOSIS — I2510 Atherosclerotic heart disease of native coronary artery without angina pectoris: Secondary | ICD-10-CM

## 2015-11-30 LAB — POCT I-STAT, CHEM 8
BUN: 9 mg/dL (ref 6–20)
CREATININE: 0.9 mg/dL (ref 0.61–1.24)
Calcium, Ion: 1.29 mmol/L (ref 1.15–1.40)
Chloride: 94 mmol/L — ABNORMAL LOW (ref 101–111)
GLUCOSE: 135 mg/dL — AB (ref 65–99)
HEMATOCRIT: 33 % — AB (ref 39.0–52.0)
HEMOGLOBIN: 11.2 g/dL — AB (ref 13.0–17.0)
POTASSIUM: 4.5 mmol/L (ref 3.5–5.1)
Sodium: 134 mmol/L — ABNORMAL LOW (ref 135–145)
TCO2: 26 mmol/L (ref 0–100)

## 2015-11-30 LAB — BASIC METABOLIC PANEL
Anion gap: 7 (ref 5–15)
BUN: 10 mg/dL (ref 6–20)
CHLORIDE: 101 mmol/L (ref 101–111)
CO2: 24 mmol/L (ref 22–32)
CREATININE: 0.89 mg/dL (ref 0.61–1.24)
Calcium: 8.9 mg/dL (ref 8.9–10.3)
GFR calc Af Amer: >60 mL/min (ref >60)
GFR calc non Af Amer: >60 mL/min (ref >60)
Glucose, Bld: 125 mg/dL — ABNORMAL HIGH (ref 65–99)
POTASSIUM: 4.6 mmol/L (ref 3.5–5.1)
Sodium: 132 mmol/L — ABNORMAL LOW (ref 135–145)

## 2015-11-30 LAB — CBC
HCT: 34.1 % — ABNORMAL LOW (ref 39.0–52.0)
HEMATOCRIT: 33.2 % — AB (ref 39.0–52.0)
HEMOGLOBIN: 10.6 g/dL — AB (ref 13.0–17.0)
Hemoglobin: 10.9 g/dL — ABNORMAL LOW (ref 13.0–17.0)
MCH: 30.5 pg (ref 26.0–34.0)
MCH: 30.7 pg (ref 26.0–34.0)
MCHC: 31.9 g/dL (ref 30.0–36.0)
MCHC: 32 g/dL (ref 30.0–36.0)
MCV: 95.4 fL (ref 78.0–100.0)
MCV: 96.1 fL (ref 78.0–100.0)
Platelets: 177 10*3/uL (ref 150–400)
Platelets: 160 10*3/uL (ref 150–400)
RBC: 3.48 MIL/uL — ABNORMAL LOW (ref 4.22–5.81)
RBC: 3.55 MIL/uL — ABNORMAL LOW (ref 4.22–5.81)
RDW: 14.5 % (ref 11.5–15.5)
RDW: 14.3 % (ref 11.5–15.5)
WBC: 6.3 10*3/uL (ref 4.0–10.5)
WBC: 5.3 10*3/uL (ref 4.0–10.5)

## 2015-11-30 LAB — MAGNESIUM
Magnesium: 2.3 mg/dL (ref 1.7–2.4)
Magnesium: 2 mg/dL (ref 1.7–2.4)

## 2015-11-30 LAB — CREATININE, SERUM
CREATININE: 0.89 mg/dL (ref 0.61–1.24)
GFR calc non Af Amer: >60 mL/min (ref >60)

## 2015-11-30 LAB — GLUCOSE, CAPILLARY
GLUCOSE-CAPILLARY: 110 mg/dL — AB (ref 65–99)
GLUCOSE-CAPILLARY: 113 mg/dL — AB (ref 65–99)
GLUCOSE-CAPILLARY: 115 mg/dL — AB (ref 65–99)
GLUCOSE-CAPILLARY: 133 mg/dL — AB (ref 65–99)
GLUCOSE-CAPILLARY: 134 mg/dL — AB (ref 65–99)
GLUCOSE-CAPILLARY: 140 mg/dL — AB (ref 65–99)
GLUCOSE-CAPILLARY: 148 mg/dL — AB (ref 65–99)
Glucose-Capillary: 111 mg/dL — ABNORMAL HIGH (ref 65–99)
Glucose-Capillary: 121 mg/dL — ABNORMAL HIGH (ref 65–99)
Glucose-Capillary: 125 mg/dL — ABNORMAL HIGH (ref 65–99)
Glucose-Capillary: 129 mg/dL — ABNORMAL HIGH (ref 65–99)
Glucose-Capillary: 129 mg/dL — ABNORMAL HIGH (ref 65–99)
Glucose-Capillary: 139 mg/dL — ABNORMAL HIGH (ref 65–99)
Glucose-Capillary: 141 mg/dL — ABNORMAL HIGH (ref 65–99)
Glucose-Capillary: 150 mg/dL — ABNORMAL HIGH (ref 65–99)

## 2015-11-30 LAB — CARBOXYHEMOGLOBIN
Carboxyhemoglobin: 1.4 % (ref 0.5–1.5)
Methemoglobin: 0.9 % (ref 0.0–1.5)
O2 SAT: 62.5 %
TOTAL HEMOGLOBIN: 10.5 g/dL — AB (ref 12.0–16.0)

## 2015-11-30 MED ORDER — FUROSEMIDE 10 MG/ML IJ SOLN
40.0000 mg | Freq: Two times a day (BID) | INTRAMUSCULAR | Status: AC
  Administered 2015-11-30 – 2015-12-01 (×2): 40 mg via INTRAVENOUS
  Filled 2015-11-30 (×2): qty 4

## 2015-11-30 MED ORDER — NOREPINEPHRINE BITARTRATE 1 MG/ML IV SOLN
0.0000 ug/min | INTRAVENOUS | Status: DC
  Filled 2015-11-30: qty 4

## 2015-11-30 MED ORDER — ENOXAPARIN SODIUM 40 MG/0.4ML ~~LOC~~ SOLN
40.0000 mg | Freq: Every day | SUBCUTANEOUS | Status: DC
Start: 2015-11-30 — End: 2015-12-05
  Administered 2015-11-30 – 2015-12-04 (×5): 40 mg via SUBCUTANEOUS
  Filled 2015-11-30 (×5): qty 0.4

## 2015-11-30 MED ORDER — INSULIN DETEMIR 100 UNIT/ML ~~LOC~~ SOLN: 15.0000 [IU] | Freq: Every day | SUBCUTANEOUS | Status: DC

## 2015-11-30 MED ORDER — CLOPIDOGREL BISULFATE 75 MG PO TABS
75.0000 mg | ORAL_TABLET | Freq: Every day | ORAL | Status: DC
Start: 2015-12-01 — End: 2015-12-05
  Administered 2015-12-01 – 2015-12-05 (×5): 75 mg via ORAL
  Filled 2015-11-30 (×5): qty 1

## 2015-11-30 MED ORDER — INSULIN ASPART 100 UNIT/ML ~~LOC~~ SOLN
0.0000 [IU] | SUBCUTANEOUS | Status: DC
  Administered 2015-11-30 – 2015-12-01 (×3): 2 [IU] via SUBCUTANEOUS
  Administered 2015-12-02: 4 [IU] via SUBCUTANEOUS
  Administered 2015-12-03: 2 [IU] via SUBCUTANEOUS

## 2015-11-30 MED ORDER — INSULIN DETEMIR 100 UNIT/ML ~~LOC~~ SOLN
15.0000 [IU] | Freq: Every day | SUBCUTANEOUS | Status: DC
  Administered 2015-11-30 – 2015-12-03 (×4): 15 [IU] via SUBCUTANEOUS
  Filled 2015-11-30 (×6): qty 0.15

## 2015-11-30 MED FILL — Sodium Bicarbonate IV Soln 8.4%: INTRAVENOUS | Qty: 50 | Status: AC

## 2015-11-30 MED FILL — Heparin Sodium (Porcine) Inj 1000 Unit/ML: INTRAMUSCULAR | Qty: 10 | Status: AC

## 2015-11-30 MED FILL — Sodium Chloride IV Soln 0.9%: INTRAVENOUS | Qty: 2000 | Status: AC

## 2015-11-30 MED FILL — Lidocaine HCl IV Inj 20 MG/ML: INTRAVENOUS | Qty: 5 | Status: AC

## 2015-11-30 MED FILL — Electrolyte-R (PH 7.4) Solution: INTRAVENOUS | Qty: 1000 | Status: AC

## 2015-11-30 MED FILL — Mannitol IV Soln 20%: INTRAVENOUS | Qty: 500 | Status: AC

## 2015-11-30 NOTE — Progress Notes (Signed)
Patient ID: Shane Kim, male   DOB: 1945-05-10, 70 y.o.   MRN: [id number] EVENING ROUNDS NOTE :     [address]       [place] [address]             [phone]                 1 Day Post-Op Procedure(s) (LRB): CORONARY ARTERY BYPASS GRAFTING (CABG) x four, using left internal mammary artery and right leg saphenous vein harvested endoscopically (N/A) TRANSESOPHAGEAL ECHOCARDIOGRAM (TEE) (N/A) LEFT FEMORAL ARTERIAL LINE INSERTION (Left)  Total Length of Stay:  LOS: 8 days  BP (!) 127/101   Pulse (!) 113   Temp 98.2 F (36.8 C) (Oral)   Resp (!) 31   Ht 6\' 3"  (1.905 m)   Wt 224 lb 3.3 oz (101.7 kg)   SpO2 95%   BMI 28.02 kg/m   .Intake/Output      10/06 0701 - 10/07 0700   P.O.    I.V. (mL/kg) 374.6 (3.7)   Blood    IV Piggyback 50   Total Intake(mL/kg) 424.6 (4.2)   Urine (mL/kg/hr) 1320 (1.1)   Chest Tube 60 (0)   Total Output 1380   Net -955.4         . sodium chloride 20 mL/hr at 11/30/15 0700  . sodium chloride Stopped (11/30/15 0913)  . sodium chloride    . DOPamine 2 mcg/kg/min (11/30/15 0800)  . lactated ringers 20 mL/hr at 11/30/15 0700  . lactated ringers 20 mL/hr at 11/30/15 0912  . milrinone 0.25 mcg/kg/min (11/30/15 1018)  . phenylephrine (NEO-SYNEPHRINE) Adult infusion Stopped (11/30/15 0740)     Lab Results  Component Value Date   WBC 5.3 11/30/2015   HGB 11.2 (L) 11/30/2015   HCT 33.0 (L) 11/30/2015   PLT 160 11/30/2015   GLUCOSE 135 (H) 11/30/2015   CHOL 177 11/21/2015   TRIG 101 11/21/2015   HDL 41 11/21/2015   LDLCALC 116 (H) 11/21/2015   ALT 30 11/23/2015   AST 29 11/23/2015   NA 134 (L) 11/30/2015   K 4.5 11/30/2015   CL 94 (L) 11/30/2015   CREATININE 0.90 11/30/2015   BUN 9 11/30/2015   CO2 24 11/30/2015   TSH 1.386 11/21/2015   INR 1.30 11/29/2015   HGBA1C 5.9 (H) 11/21/2015   Just walked 900 feet without difficulty Wean dopamine down   [name] MD  Beeper [phone] Office  [phone] 11/30/2015 7:02 PM

## 2015-11-30 NOTE — Addendum Note (Signed)
Addendum  created 11/30/15 [id number] by Roderic Palau, MD   Anesthesia Intra Blocks edited, Sign clinical note

## 2015-11-30 NOTE — Progress Notes (Signed)
1 Day Post-Op Procedure(s) (LRB): CORONARY ARTERY BYPASS GRAFTING (CABG) x four, using left internal mammary artery and right leg saphenous vein harvested endoscopically (N/A) TRANSESOPHAGEAL ECHOCARDIOGRAM (TEE) (N/A) LEFT FEMORAL ARTERIAL LINE INSERTION (Left) Subjective: No specific complaints  Objective: Vital signs in last 24 hours: Temp:  [97 F (36.1 C)-99.3 F (37.4 C)] 98.1 F (36.7 C) (10/06 0800) Pulse Rate:  [88-127] 103 (10/06 0920) Cardiac Rhythm: Normal sinus rhythm (10/06 0800) Resp:  [0-33] 25 (10/06 0800) BP: (80-121)/(61-87) 98/69 (10/06 0920) SpO2:  [96 %-100 %] 100 % (10/06 0800) Arterial Line BP: (85-159)/(39-81) 118/50 (10/06 0800) FiO2 (%):  [40 %-50 %] 40 % (10/05 2035) Weight:  [101.7 kg (224 lb 3.3 oz)] 101.7 kg (224 lb 3.3 oz) (10/06 0500)  Hemodynamic parameters for last 24 hours: PAP: (22-266)/(12-225) 28/17 CO:  [4.1 L/min-8.3 L/min] 4.9 L/min CI:  [1.8 L/min/m2-3.7 L/min/m2] 2.2 L/min/m2  Intake/Output from previous day: 10/05 0701 - 10/06 0700 In: [id number] [P.O.:30; I.V.:3894.2; Blood:464; IV Piggyback:1700] Out: [id number] [Urine:3480; Chest Tube:630] Intake/Output this shift: Total I/O In: 3 [I.V.:3] Out: -   General appearance: alert and cooperative Neurologic: intact Heart: regular rate and rhythm, S1, S2 normal, no murmur, click, rub or gallop Lungs: clear to auscultation bilaterally Abdomen: soft and nontender Extremities: edema mild Wound: dressings dry  Lab Results:  Recent Labs  11/29/15 1955 11/30/15 0300  WBC 6.2 6.3  HGB 11.0* 10.6*  HCT 34.6* 33.2*  PLT 178 177   BMET:  Recent Labs  11/29/15 0237  11/29/15 1953 11/29/15 1955 11/30/15 0300  NA 135  < > 135  --  132*  K 4.6  < > 4.6  --  4.6  CL 99*  < > 99*  --  101  CO2 27  --   --   --  24  GLUCOSE 92  < > 148*  --  125*  BUN 19  < > 13  --  10  CREATININE 1.13  < > 0.90 0.93 0.89  CALCIUM 9.5  --   --   --  8.9  < > = values in this interval not displayed.   PT/INR:  Recent Labs  11/29/15 1259  LABPROT 16.3*  INR 1.30   ABG    Component Value Date/Time   PHART 7.327 (L) 11/29/2015 2207   HCO3 23.9 11/29/2015 2207   TCO2 25 11/29/2015 2207   ACIDBASEDEF 2.0 11/29/2015 2207   O2SAT 96.0 11/29/2015 2207   CBG (last 3)   Recent Labs  11/30/15 0655 11/30/15 0753 11/30/15 0909  GLUCAP 134* 129* 125*   CXR: ok  ECG: sinus with lateral T-wave abnormality unchanged from preop. Assessment/Plan: S/P Procedure(s) (LRB): CORONARY ARTERY BYPASS GRAFTING (CABG) x four, using left internal mammary artery and right leg saphenous vein harvested endoscopically (N/A) TRANSESOPHAGEAL ECHOCARDIOGRAM (TEE) (N/A) LEFT FEMORAL ARTERIAL LINE INSERTION (Left)  Preop EF < 20% with moderate RV dysfunction: His RV appeared much improved postbypass on inotropes. His LV appeared somewhat better but still has a dilated LV with severe dysfunction. CO has been good so far. Will turn milrinone down to 0.25 and continue low dose dopamine. His preop BP was only running in the 90's. Will ultimately benefit from Coreg and ACE I if BP will allow. I have asked to heart failure team to consult due to low EF and RV dysfunction preop. His primary presenting symptoms were heart failure, not angina.  Plan to use ASA and Plavix in this patient with severe diffuse coronary disease.  Mobilize Diuresis Diabetes control d/c tubes/lines Continue foley due to diuresing patient and patient in ICU See progression orders   LOS: 8 days    Gaye Pollack 11/30/2015

## 2015-11-30 NOTE — Progress Notes (Signed)
Advanced Heart Failure Rounding Note   Subjective:    POD 1 S/P CABG x 4  Extubated yesterday. Remains on dopamine 2 mcg + milrinone 0.3 mcg.    Denies SOB/Orthopnea.   ECHO 10/2015 EF 20% Grade III DD Peak PA pressure 52  Severe LVH  Objective:   Weight Range:  Vital Signs:   Temp:  [97 F (36.1 C)-99.3 F (37.4 C)] 98.1 F (36.7 C) (10/06 0800) Pulse Rate:  [88-127] 103 (10/06 0920) Resp:  [0-33] 25 (10/06 0800) BP: (80-121)/(61-87) 98/69 (10/06 0920) SpO2:  [96 %-100 %] 100 % (10/06 0800) Arterial Line BP: (85-159)/(39-81) 118/50 (10/06 0800) FiO2 (%):  [40 %-50 %] 40 % (10/05 2035) Weight:  [224 lb 3.3 oz (101.7 kg)] 224 lb 3.3 oz (101.7 kg) (10/06 0500) Last BM Date: 11/28/15  Weight change: Filed Weights   11/28/15 0500 11/29/15 0457 11/30/15 0500  Weight: 214 lb 4.8 oz (97.2 kg) 212 lb 3.2 oz (96.3 kg) 224 lb 3.3 oz (101.7 kg)    Intake/Output:   Intake/Output Summary (Last 24 hours) at 11/30/15 1047 Last data filed at 11/30/15 0923  Gross per 24 hour  Intake          5591.21 ml  Output             3810 ml  Net          1781.21 ml     Physical Exam: General:  Sitting in chair. No resp difficulty.  HEENT: normal RIJ swan  Neck: supple. JVP 6-7. Carotids 2+ bilat; no bruits. No lymphadenopathy or thryomegaly appreciated. Cor: sternal dressing in place. + CTs   Regular rate & rhythm. No rubs, gallops or murmurs.  Lungs: clear but decreased in bases  Abdomen: soft, nontender, + distended.  Quiet Extremities: no cyanosis, clubbing, rash, 1+ edema Neuro: alert & orientedx3, cranial nerves grossly intact. moves all 4 extremities w/o difficulty. Affect pleasant GU: Foley  Telemetry: NSR 90-100s  Labs: Basic Metabolic Panel:  Recent Labs Lab 11/25/15 0339 11/26/15 1112 11/29/15 0237  11/29/15 1021 11/29/15 1151 11/29/15 1235 11/29/15 1406 11/29/15 1953 11/29/15 1955 11/30/15 0300  NA 137 139 135  < > 138 133* 135 137 135  --  132*  K 5.1  4.3 4.6  < > 4.4 5.4* 4.8 4.3 4.6  --  4.6  CL 102 100* 99*  < > 98* 100* 99*  --  99*  --  101  CO2 25 30 27   --   --   --   --   --   --   --  24  GLUCOSE 82 91 92  < > 91 127* 137* 137* 148*  --  125*  BUN 23* 20 19  < > 16 17 17   --  13  --  10  CREATININE 1.12 1.32* 1.13  < > 0.70 0.90 0.80  --  0.90 0.93 0.89  CALCIUM 9.1 9.6 9.5  --   --   --   --   --   --   --  8.9  MG  --   --   --   --   --   --   --   --   --  2.6* 2.3  < > = values in this interval not displayed.  Liver Function Tests: No results for input(s): AST, ALT, ALKPHOS, BILITOT, PROT, ALBUMIN in the last 168 hours. No results for input(s): LIPASE, AMYLASE in the last 168 hours.  No results for input(s): AMMONIA in the last 168 hours.  CBC:  Recent Labs Lab 11/28/15 0323 11/29/15 0237  11/29/15 1146  11/29/15 1259 11/29/15 1406 11/29/15 1953 11/29/15 1955 11/30/15 0300  WBC 3.5* 3.0*  --   --   --  8.9  --   --  6.2 6.3  HGB 14.6 14.6  < > 11.5*  < > 12.7* 12.9* 11.9* 11.0* 10.6*  HCT 45.7 45.9  < > 35.8*  < > 40.0 38.0* 35.0* 34.6* 33.2*  MCV 97.4 96.6  --   --   --  96.2  --   --  96.1 95.4  PLT 234 231  --  185  --  179  --   --  178 177  < > = values in this interval not displayed.  Cardiac Enzymes: No results for input(s): CKTOTAL, CKMB, CKMBINDEX, TROPONINI in the last 168 hours.  BNP: BNP (last 3 results)  Recent Labs  11/21/15 0220 11/22/15 0357 11/23/15 0351  BNP 1,125.0* 1,040.0* 712.6*    ProBNP (last 3 results) No results for input(s): PROBNP in the last 8760 hours.    Other results:  Imaging: Dg Chest Port 1 View  Result Date: 11/30/2015 CLINICAL DATA:  Status post cardiac surgery.  Subsequent encounter. EXAM: PORTABLE CHEST 1 VIEW COMPARISON:  11/29/2015 FINDINGS: Since the prior exam, the endotracheal tube and the nasal/ orogastric tube have been removed. Mediastinal tube and left chest tubes as well as the right internal jugular Swan-Ganz catheter are stable. Cardiac  silhouette is mildly enlarged, but stable. There is no mediastinal widening. Lung base opacity is stable consistent with atelectasis. No pulmonary edema. No pneumothorax. IMPRESSION: 1. No acute abnormality or evidence of an operative complication. 2. Mild lung base opacity consistent with atelectasis, stable from the previous day's study. 3. Remaining support apparatus is well positioned. Electronically Signed   By: Lajean Manes M.D.   On: 11/30/2015 07:50   Dg Chest Port 1 View  Result Date: 11/29/2015 CLINICAL DATA:  Status post CABG surgery. EXAM: PORTABLE CHEST 1 VIEW COMPARISON:  11/26/2015 FINDINGS: Since the prior exam, CABG surgery has been performed. The standard lines and tubes are in place, including: An endotracheal tube with its tip projecting 6.5 cm above the carina, a right internal jugular Swan-Ganz catheter with its tip directed towards the right pulmonary artery, a nasal/orogastric tube passing below the diaphragm well into the stomach, a mediastinal tube and a left-sided chest tube. The cardiac silhouette is mildly enlarged. There is no mediastinal widening. Lungs are essentially clear. No convincing pleural effusion. No pneumothorax. IMPRESSION: 1. No evidence of an operative complication. No mediastinal widening, pulmonary edema or pneumothorax. 2. Endotracheal tube tip projects 6.5 cm above the carina. Consider further insertion for more optimal positioning. 3. Remaining support apparatus is well positioned. Electronically Signed   By: Lajean Manes M.D.   On: 11/29/2015 14:28     Medications:     Scheduled Medications: . acetaminophen  1,000 mg Oral Q6H   Or  . acetaminophen (TYLENOL) oral liquid 160 mg/5 mL  1,000 mg Per Tube Q6H  . aspirin EC  325 mg Oral Daily   Or  . aspirin  324 mg Per Tube Daily  . atorvastatin  80 mg Oral q1800  . bisacodyl  10 mg Oral Daily   Or  . bisacodyl  10 mg Rectal Daily  . cefUROXime (ZINACEF)  IV  1.5 g Intravenous Q12H  . [START ON  12/01/2015] clopidogrel  75 mg Oral Daily  . docusate sodium  200 mg Oral Daily  . enoxaparin (LOVENOX) injection  40 mg Subcutaneous QHS  . finasteride  5 mg Oral Daily  . furosemide  40 mg Intravenous BID  . insulin aspart  0-24 Units Subcutaneous Q4H  . insulin detemir  15 Units Subcutaneous Daily  . insulin regular  0-10 Units Intravenous TID WC  . mouth rinse  15 mL Mouth Rinse BID  . [START ON 12/01/2015] pantoprazole  40 mg Oral Daily  . sodium chloride flush  3 mL Intravenous Q12H    Infusions: . sodium chloride 20 mL/hr at 11/30/15 0700  . sodium chloride Stopped (11/30/15 0913)  . sodium chloride    . DOPamine 1.994 mcg/kg/min (11/30/15 0700)  . insulin (NOVOLIN-R) infusion 2.2 Units/hr (11/30/15 0700)  . lactated ringers 20 mL/hr at 11/30/15 0700  . lactated ringers 20 mL/hr at 11/30/15 0912  . milrinone 0.33 mcg/kg/min (11/30/15 0700)  . phenylephrine (NEO-SYNEPHRINE) Adult infusion 20 mcg/min (11/30/15 0701)    PRN Medications: sodium chloride, albumin human, lactated ringers, morphine injection, ondansetron (ZOFRAN) IV, oxyCODONE, sodium chloride flush, traMADol   Assessment:  1. CAD S/P CABG x4  2. ICM  3. Acute Systolic Heart Failure 4. Hyperlipidemia   Plan/Discussion:    S/P CABG x 4.   Doing well. Off neo. Remains on dopamine 2 mcg + milrinone 0.3 mcg. Check CO-OX. Volume status ok. Continue current diuretics. Hold off on bb and Arb. Renal function stable.    Length of Stay: [place] NP-C  11/30/2015, 10:47 AM  Advanced Heart Failure Team Pager [phone] (M-F; [place])  Please contact [facility] for night-coverage after hours (4p -7a ) and weekends on [email or web address]  Patient seen and examined with Darrick Grinder, NP. We discussed all aspects of the encounter. I agree with the assessment and plan as stated above.   Doing very well post-op. Continue routine post-CABG care. Continue milrinone and dopamine for now. Diurese. Will check co-ox.  Maintaining NSR.  Continue to progress.   Bari Handshoe,MD 8:26 PM

## 2015-11-30 NOTE — Care Management Important Message (Signed)
Important Message  Patient Details  Name: Shane Kim MRN: [id number] Date of Birth: 1945-11-29   Medicare Important Message Given:  Yes    [name] 11/30/2015, 1:03 PM

## 2015-11-30 NOTE — Care Management Note (Signed)
Case Management Note  Patient Details  Name: Shane Kim MRN: [id number] Date of Birth: 06-Apr-1945  Subjective/Objective:    Pt transferred from [place] with diagnosis for CHF.              Action/Plan:   Action/Plan created by Joni Reining 11/21/15 Patient admitted with new diagnosis of CHF.  Cardiology consult pending.  Patient does not have a PCP, but wife says she got an appointment with a clinic in [place] for 10/10.  Does not remember the name.  Referral to Heart Failure Clinic. has scales at home for daily weights.   Independent in all adls, denies issues accessing medical care, obtaining medications or with transportation.     Expected Discharge Date:                  Expected Discharge Plan:     In-House Referral:     Discharge planning Services  CM Consult  Post Acute Care Choice:    Choice offered to:     DME Arranged:    DME Agency:     HH Arranged:    HH Agency:     Status of Service:  In process, will continue to follow  If discussed at Long Length of Stay Meetings, dates discussed:    Additional Comments: 11/30/2015 Elenor Quinones, RN, BSN [phone] Pt 1 day s/p CABG - remains on milrinone and dopamine.  Maryclare Labrador, RN 11/30/2015, 4:03 PM

## 2015-12-01 ENCOUNTER — Inpatient Hospital Stay (HOSPITAL_COMMUNITY): Payer: BLUE CROSS/BLUE SHIELD

## 2015-12-01 DIAGNOSIS — I255 Ischemic cardiomyopathy: Secondary | ICD-10-CM

## 2015-12-01 LAB — GLUCOSE, CAPILLARY
GLUCOSE-CAPILLARY: 115 mg/dL — AB (ref 65–99)
GLUCOSE-CAPILLARY: 148 mg/dL — AB (ref 65–99)
GLUCOSE-CAPILLARY: 150 mg/dL — AB (ref 65–99)
Glucose-Capillary: 112 mg/dL — ABNORMAL HIGH (ref 65–99)
Glucose-Capillary: 111 mg/dL — ABNORMAL HIGH (ref 65–99)
Glucose-Capillary: 107 mg/dL — ABNORMAL HIGH (ref 65–99)

## 2015-12-01 LAB — CBC
HEMATOCRIT: 29.6 % — AB (ref 39.0–52.0)
Hemoglobin: 9.5 g/dL — ABNORMAL LOW (ref 13.0–17.0)
MCH: 30.4 pg (ref 26.0–34.0)
MCHC: 32.1 g/dL (ref 30.0–36.0)
MCV: 94.9 fL (ref 78.0–100.0)
Platelets: 137 10*3/uL — ABNORMAL LOW (ref 150–400)
RBC: 3.12 MIL/uL — ABNORMAL LOW (ref 4.22–5.81)
RDW: 14.2 % (ref 11.5–15.5)
WBC: 3.8 10*3/uL — ABNORMAL LOW (ref 4.0–10.5)

## 2015-12-01 LAB — BASIC METABOLIC PANEL
Anion gap: 6 (ref 5–15)
BUN: 8 mg/dL (ref 6–20)
CALCIUM: 8.8 mg/dL — AB (ref 8.9–10.3)
CO2: 28 mmol/L (ref 22–32)
CREATININE: 0.99 mg/dL (ref 0.61–1.24)
Chloride: 98 mmol/L — ABNORMAL LOW (ref 101–111)
GFR calc non Af Amer: >60 mL/min (ref >60)
GLUCOSE: 126 mg/dL — AB (ref 65–99)
Potassium: 4.5 mmol/L (ref 3.5–5.1)
Sodium: 132 mmol/L — ABNORMAL LOW (ref 135–145)

## 2015-12-01 LAB — CARBOXYHEMOGLOBIN
Carboxyhemoglobin: 2 % — ABNORMAL HIGH (ref 0.5–1.5)
Methemoglobin: 0.8 % (ref 0.0–1.5)
O2 SAT: 60.9 %
TOTAL HEMOGLOBIN: 9.8 g/dL — AB (ref 12.0–16.0)

## 2015-12-01 MED ORDER — MILRINONE LACTATE IN DEXTROSE 20-5 MG/100ML-% IV SOLN
0.1250 ug/kg/min | INTRAVENOUS | Status: DC
  Administered 2015-12-02: 0.125 ug/kg/min via INTRAVENOUS
  Filled 2015-12-01: qty 100

## 2015-12-01 MED ORDER — METOPROLOL TARTRATE 5 MG/5ML IV SOLN
5.0000 mg | Freq: Once | INTRAVENOUS | Status: AC
  Administered 2015-12-01: 2.5 mg via INTRAVENOUS
  Filled 2015-12-01: qty 5

## 2015-12-01 MED ORDER — SPIRONOLACTONE 25 MG PO TABS
12.5000 mg | ORAL_TABLET | Freq: Every day | ORAL | Status: DC
  Administered 2015-12-01 – 2015-12-03 (×3): 12.5 mg via ORAL
  Filled 2015-12-01 (×3): qty 1

## 2015-12-01 MED ORDER — FUROSEMIDE 10 MG/ML IJ SOLN
40.0000 mg | Freq: Once | INTRAMUSCULAR | Status: AC
  Administered 2015-12-01: 40 mg via INTRAVENOUS
  Filled 2015-12-01: qty 4

## 2015-12-01 MED ORDER — METOPROLOL TARTRATE 5 MG/5ML IV SOLN
2.5000 mg | Freq: Once | INTRAVENOUS | Status: AC
  Administered 2015-12-01: 2.5 mg via INTRAVENOUS

## 2015-12-01 NOTE — Progress Notes (Signed)
Patient ID: Shane Kim, male   DOB: 05-Jun-1945, 70 y.o.   MRN: [id number] TCTS DAILY ICU PROGRESS NOTE                   [address]            [place] [address]          [phone]   2 Days Post-Op Procedure(s) (LRB): CORONARY ARTERY BYPASS GRAFTING (CABG) x four, using left internal mammary artery and right leg saphenous vein harvested endoscopically (N/A) TRANSESOPHAGEAL ECHOCARDIOGRAM (TEE) (N/A) LEFT FEMORAL ARTERIAL LINE INSERTION (Left)  Total Length of Stay:  LOS: 9 days   Subjective: Up in chair, alert   Objective: Vital signs in last 24 hours: Temp:  [97.7 F (36.5 C)-99 F (37.2 C)] 97.7 F (36.5 C) (10/07 0833) Pulse Rate:  [98-127] 117 (10/07 0700) Cardiac Rhythm: Sinus tachycardia (10/07 0400) Resp:  [0-34] 24 (10/07 0700) BP: (85-130)/(56-101) 105/67 (10/07 0700) SpO2:  [93 %-100 %] 96 % (10/07 0700) Arterial Line BP: (119-148)/(56-67) 140/61 (10/06 1445) Weight:  [220 lb 14.4 oz (100.2 kg)] 220 lb 14.4 oz (100.2 kg) (10/07 0500)  Filed Weights   11/29/15 0457 11/30/15 0500 12/01/15 0500  Weight: 212 lb 3.2 oz (96.3 kg) 224 lb 3.3 oz (101.7 kg) 220 lb 14.4 oz (100.2 kg)    Weight change: -3 lb 4.9 oz (-1.5 kg)   Hemodynamic parameters for last 24 hours:    Intake/Output from previous day: 10/06 0701 - 10/07 0700 In: 934.8 [P.O.:100; I.V.:734.8; IV Piggyback:100] Out: [id number] [Urine:2970; Chest Tube:60]  Intake/Output this shift: No intake/output data recorded.  Current Meds: Scheduled Meds: . acetaminophen  1,000 mg Oral Q6H   Or  . acetaminophen (TYLENOL) oral liquid 160 mg/5 mL  1,000 mg Per Tube Q6H  . aspirin EC  325 mg Oral Daily   Or  . aspirin  324 mg Per Tube Daily  . atorvastatin  80 mg Oral q1800  . bisacodyl  10 mg Oral Daily   Or  . bisacodyl  10 mg Rectal Daily  . cefUROXime (ZINACEF)  IV  1.5 g Intravenous Q12H  . clopidogrel  75 mg Oral Daily  . docusate sodium  200 mg Oral Daily  . enoxaparin  (LOVENOX) injection  40 mg Subcutaneous QHS  . finasteride  5 mg Oral Daily  . insulin aspart  0-24 Units Subcutaneous Q4H  . insulin detemir  15 Units Subcutaneous Daily  . mouth rinse  15 mL Mouth Rinse BID  . pantoprazole  40 mg Oral Daily  . sodium chloride flush  3 mL Intravenous Q12H   Continuous Infusions: . sodium chloride 20 mL/hr at 11/30/15 0700  . sodium chloride Stopped (11/30/15 0913)  . sodium chloride    . DOPamine Stopped (11/30/15 2000)  . lactated ringers 20 mL/hr at 11/30/15 0700  . lactated ringers 20 mL/hr at 11/30/15 2300  . milrinone 0.25 mcg/kg/min (12/01/15 1009)  . phenylephrine (NEO-SYNEPHRINE) Adult infusion Stopped (11/30/15 0740)   PRN Meds:.sodium chloride, lactated ringers, morphine injection, ondansetron (ZOFRAN) IV, oxyCODONE, sodium chloride flush, traMADol  General appearance: alert, cooperative and no distress Neurologic: intact Heart: regular rate and rhythm, S1, S2 normal, no murmur, click, rub or gallop Lungs: diminished breath sounds bilaterally Abdomen: soft, non-tender; bowel sounds normal; no masses,  no organomegaly Extremities: extremities normal, atraumatic, no cyanosis or edema and Homans sign is negative, no sign of DVT Wound: sternum stable  Lab Results: CBC: Recent Labs  11/30/15 1620 11/30/15 1623 12/01/15 0300  WBC 5.3  --  3.8*  HGB 10.9* 11.2* 9.5*  HCT 34.1* 33.0* 29.6*  PLT 160  --  137*   BMET:  Recent Labs  11/30/15 0300  11/30/15 1623 12/01/15 0300  NA 132*  --  134* 132*  K 4.6  --  4.5 4.5  CL 101  --  94* 98*  CO2 24  --   --  28  GLUCOSE 125*  --  135* 126*  BUN 10  --  9 8  CREATININE 0.89  < > 0.90 0.99  CALCIUM 8.9  --   --  8.8*  < > = values in this interval not displayed.  CMET: Lab Results  Component Value Date   WBC 3.8 (L) 12/01/2015   HGB 9.5 (L) 12/01/2015   HCT 29.6 (L) 12/01/2015   PLT 137 (L) 12/01/2015   GLUCOSE 126 (H) 12/01/2015   CHOL 177 11/21/2015   TRIG 101 11/21/2015     HDL 41 11/21/2015   LDLCALC 116 (H) 11/21/2015   ALT 30 11/23/2015   AST 29 11/23/2015   NA 132 (L) 12/01/2015   K 4.5 12/01/2015   CL 98 (L) 12/01/2015   CREATININE 0.99 12/01/2015   BUN 8 12/01/2015   CO2 28 12/01/2015   TSH 1.386 11/21/2015   INR 1.30 11/29/2015   HGBA1C 5.9 (H) 11/21/2015    PT/INR:  Recent Labs  11/29/15 1259  LABPROT 16.3*  INR 1.30   Radiology: Dg Chest Port 1 View  Result Date: 12/01/2015 CLINICAL DATA:  Recent coronary artery bypass grafting. Atelectasis. EXAM: PORTABLE CHEST 1 VIEW COMPARISON:  November 30, 2015 FINDINGS: Swan-Ganz catheter is been removed. Cordis tip is in the superior vena cava. Temporary pacemaker wires are attached to the right heart. Left chest tube and mediastinal drain have been removed. No evident pneumothorax. There is persistent atelectatic change in the right base. There is a minimal pleural effusion on the left. Lungs elsewhere clear. There is stable cardiomegaly. The pulmonary vascularity is normal. No adenopathy. IMPRESSION: Cordis tip is in the superior vena cava. No pneumothorax. Persistent right base atelectasis. Minimal left pleural effusion. Stable cardiomegaly. Electronically Signed   By: Lowella Grip III M.D.   On: 12/01/2015 08:05   COX 60.9  Assessment/Plan: S/P Procedure(s) (LRB): CORONARY ARTERY BYPASS GRAFTING (CABG) x four, using left internal mammary artery and right leg saphenous vein harvested endoscopically (N/A) TRANSESOPHAGEAL ECHOCARDIOGRAM (TEE) (N/A) LEFT FEMORAL ARTERIAL LINE INSERTION (Left) Mobilize Diuresis Dopamine off  Progressing well    Shane Kim 12/01/2015 10:44 AM

## 2015-12-01 NOTE — Progress Notes (Signed)
Patient ID: Shane Kim, male   DOB: Nov 18, 1945, 70 y.o.   MRN: [id number] EVENING ROUNDS NOTE :     [address]       [place] [address]             [phone]                 2 Days Post-Op Procedure(s) (LRB): CORONARY ARTERY BYPASS GRAFTING (CABG) x four, using left internal mammary artery and right leg saphenous vein harvested endoscopically (N/A) TRANSESOPHAGEAL ECHOCARDIOGRAM (TEE) (N/A) LEFT FEMORAL ARTERIAL LINE INSERTION (Left)  Total Length of Stay:  LOS: 9 days  BP 97/84   Pulse (!) 120   Temp 99 F (37.2 C) (Oral)   Resp 19   Ht 6\' 3"  (1.905 m)   Wt 220 lb 14.4 oz (100.2 kg)   SpO2 98%   BMI 27.61 kg/m   .Intake/Output      10/07 0701 - 10/08 0700   P.O. 480   I.V. (mL/kg) 32.4 (0.3)   IV Piggyback    Total Intake(mL/kg) 512.4 (5.1)   Urine (mL/kg/hr) 3500 (2.7)   Chest Tube    Total Output 3500   Net -2987.6         . sodium chloride 20 mL/hr at 11/30/15 0700  . sodium chloride Stopped (11/30/15 0913)  . sodium chloride 20 mL/hr (12/01/15 1842)  . DOPamine Stopped (11/30/15 2000)  . lactated ringers 20 mL/hr at 11/30/15 0700  . lactated ringers 20 mL/hr at 11/30/15 2300  . milrinone 0.125 mcg/kg/min (12/01/15 1300)  . phenylephrine (NEO-SYNEPHRINE) Adult infusion Stopped (11/30/15 0740)     Lab Results  Component Value Date   WBC 3.8 (L) 12/01/2015   HGB 9.5 (L) 12/01/2015   HCT 29.6 (L) 12/01/2015   PLT 137 (L) 12/01/2015   GLUCOSE 126 (H) 12/01/2015   CHOL 177 11/21/2015   TRIG 101 11/21/2015   HDL 41 11/21/2015   LDLCALC 116 (H) 11/21/2015   ALT 30 11/23/2015   AST 29 11/23/2015   NA 132 (L) 12/01/2015   K 4.5 12/01/2015   CL 98 (L) 12/01/2015   CREATININE 0.99 12/01/2015   BUN 8 12/01/2015   CO2 28 12/01/2015   TSH 1.386 11/21/2015   INR 1.30 11/29/2015   HGBA1C 5.9 (H) 11/21/2015   Hr 115, with frequent pac's vs Afib Walked 4 times around unit    [name] MD  Beeper [phone] Office  [phone] 12/01/2015 7:44 PM

## 2015-12-01 NOTE — Progress Notes (Signed)
Advanced Heart Failure Rounding Note   Subjective:    POD 1 S/P CABG x 4  Doing great. Walked 900 feet last night. Dopamine stopped due to tachycardia. HR still in low 100s. On milrinone 0.25 mcg.    Denies SOB/Orthopnea. Passing gas. No BM yet. Co-ox 61% Weigh down 4 pound but still 5-6 pounds from basline.   ECHO 10/2015 EF 20% Grade III DD Peak PA pressure 52  Severe LVH  Objective:   Weight Range:  Vital Signs:   Temp:  [97.7 F (36.5 C)-99 F (37.2 C)] 97.7 F (36.5 C) (10/07 0833) Pulse Rate:  [98-127] 117 (10/07 0700) Resp:  [0-34] 24 (10/07 0700) BP: (85-130)/(56-101) 105/67 (10/07 0700) SpO2:  [93 %-100 %] 96 % (10/07 0700) Arterial Line BP: (119-148)/(56-67) 140/61 (10/06 1445) Weight:  [100.2 kg (220 lb 14.4 oz)] 100.2 kg (220 lb 14.4 oz) (10/07 0500) Last BM Date: 11/28/15  Weight change: Filed Weights   11/29/15 0457 11/30/15 0500 12/01/15 0500  Weight: 96.3 kg (212 lb 3.2 oz) 101.7 kg (224 lb 3.3 oz) 100.2 kg (220 lb 14.4 oz)    Intake/Output:   Intake/Output Summary (Last 24 hours) at 12/01/15 1044 Last data filed at 12/01/15 0700  Gross per 24 hour  Intake           817.39 ml  Output             2730 ml  Net         -1912.61 ml     Physical Exam: General:  Sitting in chair. No resp difficulty.  HEENT: normal  Neck: supple. RIJ central line JVP 6-7. Carotids 2+ bilat; no bruits. No lymphadenopathy or thryomegaly appreciated. Cor: sternal dressing in place. Tachy regular  No rubs, gallops or murmurs.  Lungs: clear but decreased in bases  Abdomen: soft, nontender, hypoactive BS Extremities: no cyanosis, clubbing, rash, 1+ edema Neuro: alert & orientedx3, cranial nerves grossly intact. moves all 4 extremities w/o difficulty. Affect pleasant GU: Foley  Telemetry: NSR 100-110 with PVCs  Labs: Basic Metabolic Panel:  Recent Labs Lab 11/25/15 0339 11/26/15 1112 11/29/15 0237  11/29/15 1235 11/29/15 1406 11/29/15 1953 11/29/15 1955  11/30/15 0300 11/30/15 1620 11/30/15 1623 12/01/15 0300  NA 137 139 135  < > 135 137 135  --  132*  --  134* 132*  K 5.1 4.3 4.6  < > 4.8 4.3 4.6  --  4.6  --  4.5 4.5  CL 102 100* 99*  < > 99*  --  99*  --  101  --  94* 98*  CO2 25 30 27   --   --   --   --   --  24  --   --  28  GLUCOSE 82 91 92  < > 137* 137* 148*  --  125*  --  135* 126*  BUN 23* 20 19  < > 17  --  13  --  10  --  9 8  CREATININE 1.12 1.32* 1.13  < > 0.80  --  0.90 0.93 0.89 0.89 0.90 0.99  CALCIUM 9.1 9.6 9.5  --   --   --   --   --  8.9  --   --  8.8*  MG  --   --   --   --   --   --   --  2.6* 2.3 2.0  --   --   < > = values in  this interval not displayed.  Liver Function Tests: No results for input(s): AST, ALT, ALKPHOS, BILITOT, PROT, ALBUMIN in the last 168 hours. No results for input(s): LIPASE, AMYLASE in the last 168 hours. No results for input(s): AMMONIA in the last 168 hours.  CBC:  Recent Labs Lab 11/29/15 1259  11/29/15 1955 11/30/15 0300 11/30/15 1620 11/30/15 1623 12/01/15 0300  WBC 8.9  --  6.2 6.3 5.3  --  3.8*  HGB 12.7*  < > 11.0* 10.6* 10.9* 11.2* 9.5*  HCT 40.0  < > 34.6* 33.2* 34.1* 33.0* 29.6*  MCV 96.2  --  96.1 95.4 96.1  --  94.9  PLT 179  --  178 177 160  --  137*  < > = values in this interval not displayed.  Cardiac Enzymes: No results for input(s): CKTOTAL, CKMB, CKMBINDEX, TROPONINI in the last 168 hours.  BNP: BNP (last 3 results)  Recent Labs  11/21/15 0220 11/22/15 0357 11/23/15 0351  BNP 1,125.0* 1,040.0* 712.6*    ProBNP (last 3 results) No results for input(s): PROBNP in the last 8760 hours.    Other results:  Imaging: Dg Chest Port 1 View  Result Date: 12/01/2015 CLINICAL DATA:  Recent coronary artery bypass grafting. Atelectasis. EXAM: PORTABLE CHEST 1 VIEW COMPARISON:  November 30, 2015 FINDINGS: Swan-Ganz catheter is been removed. Cordis tip is in the superior vena cava. Temporary pacemaker wires are attached to the right heart. Left chest tube  and mediastinal drain have been removed. No evident pneumothorax. There is persistent atelectatic change in the right base. There is a minimal pleural effusion on the left. Lungs elsewhere clear. There is stable cardiomegaly. The pulmonary vascularity is normal. No adenopathy. IMPRESSION: Cordis tip is in the superior vena cava. No pneumothorax. Persistent right base atelectasis. Minimal left pleural effusion. Stable cardiomegaly. Electronically Signed   By: Lowella Grip III M.D.   On: 12/01/2015 08:05   Dg Chest Port 1 View  Result Date: 11/30/2015 CLINICAL DATA:  Status post cardiac surgery.  Subsequent encounter. EXAM: PORTABLE CHEST 1 VIEW COMPARISON:  11/29/2015 FINDINGS: Since the prior exam, the endotracheal tube and the nasal/ orogastric tube have been removed. Mediastinal tube and left chest tubes as well as the right internal jugular Swan-Ganz catheter are stable. Cardiac silhouette is mildly enlarged, but stable. There is no mediastinal widening. Lung base opacity is stable consistent with atelectasis. No pulmonary edema. No pneumothorax. IMPRESSION: 1. No acute abnormality or evidence of an operative complication. 2. Mild lung base opacity consistent with atelectasis, stable from the previous day's study. 3. Remaining support apparatus is well positioned. Electronically Signed   By: Lajean Manes M.D.   On: 11/30/2015 07:50   Dg Chest Port 1 View  Result Date: 11/29/2015 CLINICAL DATA:  Status post CABG surgery. EXAM: PORTABLE CHEST 1 VIEW COMPARISON:  11/26/2015 FINDINGS: Since the prior exam, CABG surgery has been performed. The standard lines and tubes are in place, including: An endotracheal tube with its tip projecting 6.5 cm above the carina, a right internal jugular Swan-Ganz catheter with its tip directed towards the right pulmonary artery, a nasal/orogastric tube passing below the diaphragm well into the stomach, a mediastinal tube and a left-sided chest tube. The cardiac silhouette  is mildly enlarged. There is no mediastinal widening. Lungs are essentially clear. No convincing pleural effusion. No pneumothorax. IMPRESSION: 1. No evidence of an operative complication. No mediastinal widening, pulmonary edema or pneumothorax. 2. Endotracheal tube tip projects 6.5 cm above the carina. Consider further  insertion for more optimal positioning. 3. Remaining support apparatus is well positioned. Electronically Signed   By: Lajean Manes M.D.   On: 11/29/2015 14:28     Medications:     Scheduled Medications: . acetaminophen  1,000 mg Oral Q6H   Or  . acetaminophen (TYLENOL) oral liquid 160 mg/5 mL  1,000 mg Per Tube Q6H  . aspirin EC  325 mg Oral Daily   Or  . aspirin  324 mg Per Tube Daily  . atorvastatin  80 mg Oral q1800  . bisacodyl  10 mg Oral Daily   Or  . bisacodyl  10 mg Rectal Daily  . cefUROXime (ZINACEF)  IV  1.5 g Intravenous Q12H  . clopidogrel  75 mg Oral Daily  . docusate sodium  200 mg Oral Daily  . enoxaparin (LOVENOX) injection  40 mg Subcutaneous QHS  . finasteride  5 mg Oral Daily  . insulin aspart  0-24 Units Subcutaneous Q4H  . insulin detemir  15 Units Subcutaneous Daily  . mouth rinse  15 mL Mouth Rinse BID  . pantoprazole  40 mg Oral Daily  . sodium chloride flush  3 mL Intravenous Q12H    Infusions: . sodium chloride 20 mL/hr at 11/30/15 0700  . sodium chloride Stopped (11/30/15 0913)  . sodium chloride    . DOPamine Stopped (11/30/15 2000)  . lactated ringers 20 mL/hr at 11/30/15 0700  . lactated ringers 20 mL/hr at 11/30/15 2300  . milrinone 0.25 mcg/kg/min (12/01/15 1009)  . phenylephrine (NEO-SYNEPHRINE) Adult infusion Stopped (11/30/15 0740)    PRN Medications: sodium chloride, lactated ringers, morphine injection, ondansetron (ZOFRAN) IV, oxyCODONE, sodium chloride flush, traMADol   Assessment:   1. CAD S/P CABG x4  2. ICM  3. Acute Systolic Heart Failure. EF 20% 4. Hyperlipidemia   Plan/Discussion:    S/P CABG x 4.     Doing very well post-op. Continue routine post-CABG care. Dopamine off. Will cut milrinone back. Give one dose IV lasix today. Maintaining NSR but at high risk for AF.Marland Kitchen  Continue to progress. Start spiro. Consider low dose ARB/b-blocker. Discussed with Dr. Servando Snare.  Length of Stay: 9  Glori Bickers MD 12/01/2015, 10:44 AM  Advanced Heart Failure Team Pager [phone] (M-F; 7a - 4p)  Please contact [facility] for night-coverage after hours (4p -7a ) and weekends on [email or web address]

## 2015-12-01 NOTE — Progress Notes (Signed)
Dr. Servando Snare notified of pt's ST 125-130 consistently on monitor.  Order received to make Dr. Haroldine Laws aware. 1830:  Dr. Haroldine Laws paged.  Orders received.  Will continue to monitor closely.

## 2015-12-02 ENCOUNTER — Inpatient Hospital Stay (HOSPITAL_COMMUNITY): Payer: BLUE CROSS/BLUE SHIELD

## 2015-12-02 LAB — TYPE AND SCREEN
ABO/RH(D): O POS
ANTIBODY SCREEN: NEGATIVE
UNIT DIVISION: 0
UNIT DIVISION: 0
Unit division: 0
Unit division: 0

## 2015-12-02 LAB — GLUCOSE, CAPILLARY
GLUCOSE-CAPILLARY: 195 mg/dL — AB (ref 65–99)
Glucose-Capillary: 99 mg/dL (ref 65–99)
Glucose-Capillary: 106 mg/dL — ABNORMAL HIGH (ref 65–99)
Glucose-Capillary: 102 mg/dL — ABNORMAL HIGH (ref 65–99)
Glucose-Capillary: 116 mg/dL — ABNORMAL HIGH (ref 65–99)

## 2015-12-02 LAB — CARBOXYHEMOGLOBIN
Carboxyhemoglobin: 1.7 % — ABNORMAL HIGH (ref 0.5–1.5)
METHEMOGLOBIN: 0.6 % (ref 0.0–1.5)
O2 Saturation: 68.5 %
TOTAL HEMOGLOBIN: 9.7 g/dL — AB (ref 12.0–16.0)

## 2015-12-02 LAB — BASIC METABOLIC PANEL
Anion gap: 6 (ref 5–15)
BUN: 12 mg/dL (ref 6–20)
CO2: 29 mmol/L (ref 22–32)
Calcium: 8.7 mg/dL — ABNORMAL LOW (ref 8.9–10.3)
Chloride: 98 mmol/L — ABNORMAL LOW (ref 101–111)
Creatinine, Ser: 0.98 mg/dL (ref 0.61–1.24)
GFR calc Af Amer: >60 mL/min (ref >60)
GFR calc non Af Amer: >60 mL/min (ref >60)
Glucose, Bld: 96 mg/dL (ref 65–99)
Potassium: 3.9 mmol/L (ref 3.5–5.1)
Sodium: 133 mmol/L — ABNORMAL LOW (ref 135–145)

## 2015-12-02 LAB — CBC
HCT: 29.1 % — ABNORMAL LOW (ref 39.0–52.0)
Hemoglobin: 9.5 g/dL — ABNORMAL LOW (ref 13.0–17.0)
MCH: 31 pg (ref 26.0–34.0)
MCHC: 32.6 g/dL (ref 30.0–36.0)
MCV: 95.1 fL (ref 78.0–100.0)
Platelets: 175 10*3/uL (ref 150–400)
RBC: 3.06 MIL/uL — ABNORMAL LOW (ref 4.22–5.81)
RDW: 14.5 % (ref 11.5–15.5)
WBC: 3.2 10*3/uL — ABNORMAL LOW (ref 4.0–10.5)

## 2015-12-02 MED ORDER — LOSARTAN POTASSIUM 25 MG PO TABS
12.5000 mg | ORAL_TABLET | Freq: Every day | ORAL | Status: DC
  Administered 2015-12-02 – 2015-12-03 (×2): 12.5 mg via ORAL
  Filled 2015-12-02 (×2): qty 1

## 2015-12-02 MED ORDER — IVABRADINE HCL 5 MG PO TABS
2.5000 mg | ORAL_TABLET | Freq: Two times a day (BID) | ORAL | Status: DC
  Administered 2015-12-02 – 2015-12-03 (×3): 2.5 mg via ORAL
  Filled 2015-12-02 (×4): qty 1

## 2015-12-02 MED ORDER — DIGOXIN 125 MCG PO TABS
0.1250 mg | ORAL_TABLET | Freq: Every day | ORAL | Status: DC
  Administered 2015-12-02 – 2015-12-05 (×4): 0.125 mg via ORAL
  Filled 2015-12-02 (×4): qty 1

## 2015-12-02 MED ORDER — CARVEDILOL 3.125 MG PO TABS
3.1250 mg | ORAL_TABLET | Freq: Two times a day (BID) | ORAL | Status: DC
  Administered 2015-12-02 – 2015-12-05 (×5): 3.125 mg via ORAL
  Filled 2015-12-02 (×5): qty 1

## 2015-12-02 NOTE — Progress Notes (Signed)
Patient ID: Shane Kim, male   DOB: 05/24/1945, 70 y.o.   MRN: [id number] EVENING ROUNDS NOTE :     [address]       [place] [address]             [phone]                 3 Days Post-Op Procedure(s) (LRB): CORONARY ARTERY BYPASS GRAFTING (CABG) x four, using left internal mammary artery and right leg saphenous vein harvested endoscopically (N/A) TRANSESOPHAGEAL ECHOCARDIOGRAM (TEE) (N/A) LEFT FEMORAL ARTERIAL LINE INSERTION (Left)  Total Length of Stay:  LOS: 10 days  BP 97/74   Pulse (!) 103   Temp 98.4 F (36.9 C) (Oral)   Resp (!) 27   Ht 6\' 3"  (1.905 m)   Wt 217 lb 6 oz (98.6 kg)   SpO2 97%   BMI 27.17 kg/m   .Intake/Output      10/08 0701 - 10/09 0700   P.O.    I.V. (mL/kg) 18 (0.2)   Total Intake(mL/kg) 18 (0.2)   Urine (mL/kg/hr) 500 (0.4)   Total Output 500   Net -482         . sodium chloride 20 mL/hr at 11/30/15 0700  . sodium chloride Stopped (11/30/15 0913)  . sodium chloride 20 mL/hr (12/01/15 1842)  . DOPamine Stopped (11/30/15 2000)  . lactated ringers 20 mL/hr at 11/30/15 0700  . lactated ringers 20 mL/hr at 11/30/15 2300  . milrinone 0.125 mcg/kg/min (12/02/15 1800)  . phenylephrine (NEO-SYNEPHRINE) Adult infusion Stopped (11/30/15 0740)     Lab Results  Component Value Date   WBC 3.2 (L) 12/02/2015   HGB 9.5 (L) 12/02/2015   HCT 29.1 (L) 12/02/2015   PLT 175 12/02/2015   GLUCOSE 96 12/02/2015   CHOL 177 11/21/2015   TRIG 101 11/21/2015   HDL 41 11/21/2015   LDLCALC 116 (H) 11/21/2015   ALT 30 11/23/2015   AST 29 11/23/2015   NA 133 (L) 12/02/2015   K 3.9 12/02/2015   CL 98 (L) 12/02/2015   CREATININE 0.98 12/02/2015   BUN 12 12/02/2015   CO2 29 12/02/2015   TSH 1.386 11/21/2015   INR 1.30 11/29/2015   HGBA1C 5.9 (H) 11/21/2015   Stable, still on  milrinone   To step down in am  [name] MD  Beeper [phone] Office [phone] 12/02/2015 7:15 PM

## 2015-12-02 NOTE — Progress Notes (Signed)
Advanced Heart Failure Rounding Note   Subjective:    POD 2 S/P CABG x 4  Doing well. Walking unit. Dopamine off. Still on milrinone 0.125  Denies SOB/Orthopnea. Passing gas. No BM yet.   HR remains high 120s at rest up to 150 with exertion. Sinus. SBP 90-110. Diuresing well. Weight back to pre-op.   ECHO 10/2015 EF 20% Grade III DD Peak PA pressure 52  Severe LVH  Objective:   Weight Range:  Vital Signs:   Temp:  [97.8 F (36.6 C)-99 F (37.2 C)] 97.8 F (36.6 C) (10/08 0815) Pulse Rate:  [55-122] 119 (10/08 1100) Resp:  [0-34] 28 (10/08 1100) BP: (90-133)/(57-103) 95/75 (10/08 1100) SpO2:  [94 %-99 %] 97 % (10/08 1100) Weight:  [98.6 kg (217 lb 6 oz)] 98.6 kg (217 lb 6 oz) (10/08 0400) Last BM Date: 11/28/15  Weight change: Filed Weights   11/30/15 0500 12/01/15 0500 12/02/15 0400  Weight: 101.7 kg (224 lb 3.3 oz) 100.2 kg (220 lb 14.4 oz) 98.6 kg (217 lb 6 oz)    Intake/Output:   Intake/Output Summary (Last 24 hours) at 12/02/15 1154 Last data filed at 12/02/15 1100  Gross per 24 hour  Intake            265.2 ml  Output             1900 ml  Net          -1634.8 ml     Physical Exam: General:  Lying in bed. No resp difficulty.  HEENT: normal  Neck: supple. RIJ central line. Carotids 2+ bilat; no bruits. No lymphadenopathy or thryomegaly appreciated. Cor: sternal dressing in place. Tachy regular Distant  No rubs, gallops or murmurs.  Lungs: clear but decreased in bases  Abdomen: soft, nontender, hypoactive BS Extremities: no cyanosis, clubbing, rash, no edema Neuro: alert & orientedx3, cranial nerves grossly intact. moves all 4 extremities w/o difficulty. Affect pleasant  Telemetry: NSR 120s   Labs: Basic Metabolic Panel:  Recent Labs Lab 11/26/15 1112 11/29/15 0237  11/29/15 1953 11/29/15 1955 11/30/15 0300 11/30/15 1620 11/30/15 1623 12/01/15 0300 12/02/15 0405  NA 139 135  < > 135  --  132*  --  134* 132* 133*  K 4.3 4.6  < > 4.6  --   4.6  --  4.5 4.5 3.9  CL 100* 99*  < > 99*  --  101  --  94* 98* 98*  CO2 30 27  --   --   --  24  --   --  28 29  GLUCOSE 91 92  < > 148*  --  125*  --  135* 126* 96  BUN 20 19  < > 13  --  10  --  9 8 12   CREATININE 1.32* 1.13  < > 0.90 0.93 0.89 0.89 0.90 0.99 0.98  CALCIUM 9.6 9.5  --   --   --  8.9  --   --  8.8* 8.7*  MG  --   --   --   --  2.6* 2.3 2.0  --   --   --   < > = values in this interval not displayed.  Liver Function Tests: No results for input(s): AST, ALT, ALKPHOS, BILITOT, PROT, ALBUMIN in the last 168 hours. No results for input(s): LIPASE, AMYLASE in the last 168 hours. No results for input(s): AMMONIA in the last 168 hours.  CBC:  Recent Labs Lab 11/29/15 1955  11/30/15 0300 11/30/15 1620 11/30/15 1623 12/01/15 0300 12/02/15 0405  WBC 6.2 6.3 5.3  --  3.8* 3.2*  HGB 11.0* 10.6* 10.9* 11.2* 9.5* 9.5*  HCT 34.6* 33.2* 34.1* 33.0* 29.6* 29.1*  MCV 96.1 95.4 96.1  --  94.9 95.1  PLT 178 177 160  --  137* 175    Cardiac Enzymes: No results for input(s): CKTOTAL, CKMB, CKMBINDEX, TROPONINI in the last 168 hours.  BNP: BNP (last 3 results)  Recent Labs  11/21/15 0220 11/22/15 0357 11/23/15 0351  BNP 1,125.0* 1,040.0* 712.6*    ProBNP (last 3 results) No results for input(s): PROBNP in the last 8760 hours.    Other results:  Imaging: Dg Chest Port 1 View  Result Date: 12/02/2015 CLINICAL DATA:  Atelectasis EXAM: PORTABLE CHEST 1 VIEW COMPARISON:  December 01, 2015 FINDINGS: Cordis tip in superior vena cava. No pneumothorax. There is persistent right base atelectasis. Minimal left pleural effusion remains. No new opacity. Stable cardiac enlargement. Pulmonary vascularity within normal limits. No adenopathy. IMPRESSION: Stable right base atelectasis. Minimal left effusion. Stable cardiomegaly. No pneumothorax. Electronically Signed   By: Lowella Grip III M.D.   On: 12/02/2015 08:46   Dg Chest Port 1 View  Result Date: 12/01/2015 CLINICAL  DATA:  Recent coronary artery bypass grafting. Atelectasis. EXAM: PORTABLE CHEST 1 VIEW COMPARISON:  November 30, 2015 FINDINGS: Swan-Ganz catheter is been removed. Cordis tip is in the superior vena cava. Temporary pacemaker wires are attached to the right heart. Left chest tube and mediastinal drain have been removed. No evident pneumothorax. There is persistent atelectatic change in the right base. There is a minimal pleural effusion on the left. Lungs elsewhere clear. There is stable cardiomegaly. The pulmonary vascularity is normal. No adenopathy. IMPRESSION: Cordis tip is in the superior vena cava. No pneumothorax. Persistent right base atelectasis. Minimal left pleural effusion. Stable cardiomegaly. Electronically Signed   By: Lowella Grip III M.D.   On: 12/01/2015 08:05     Medications:     Scheduled Medications: . acetaminophen  1,000 mg Oral Q6H   Or  . acetaminophen (TYLENOL) oral liquid 160 mg/5 mL  1,000 mg Per Tube Q6H  . aspirin EC  325 mg Oral Daily   Or  . aspirin  324 mg Per Tube Daily  . atorvastatin  80 mg Oral q1800  . bisacodyl  10 mg Oral Daily   Or  . bisacodyl  10 mg Rectal Daily  . clopidogrel  75 mg Oral Daily  . docusate sodium  200 mg Oral Daily  . enoxaparin (LOVENOX) injection  40 mg Subcutaneous QHS  . finasteride  5 mg Oral Daily  . insulin aspart  0-24 Units Subcutaneous Q4H  . insulin detemir  15 Units Subcutaneous Daily  . mouth rinse  15 mL Mouth Rinse BID  . pantoprazole  40 mg Oral Daily  . sodium chloride flush  3 mL Intravenous Q12H  . spironolactone  12.5 mg Oral Daily    Infusions: . sodium chloride 20 mL/hr at 11/30/15 0700  . sodium chloride Stopped (11/30/15 0913)  . sodium chloride 20 mL/hr (12/01/15 1842)  . DOPamine Stopped (11/30/15 2000)  . lactated ringers 20 mL/hr at 11/30/15 0700  . lactated ringers 20 mL/hr at 11/30/15 2300  . milrinone 0.125 mcg/kg/min (12/02/15 0904)  . phenylephrine (NEO-SYNEPHRINE) Adult infusion  Stopped (11/30/15 0740)    PRN Medications: sodium chloride, lactated ringers, morphine injection, ondansetron (ZOFRAN) IV, oxyCODONE, sodium chloride flush, traMADol   Assessment:  1. CAD S/P CABG x4  2. ICM  3. Acute Systolic Heart Failure. EF 20% 4. Hyperlipidemia   Plan/Discussion:    S/P CABG x 4.   Doing very well post-op. Volume status looks good. Renal function good.  Main issue is significant tachycardia. Hgb ok. Will recheck co-ox. Add digoxin. Need to be careful with b-blocker given very low EF. Can try short course of ivabradine. Start losartan 12.5mg  daily. Continue spiro. No lasix today.   Continue to ambulate.    Length of Stay: 10  Glori Bickers MD 12/02/2015, 11:54 AM  Advanced Heart Failure Team Pager [phone] (M-F; [place])  Please contact [facility] for night-coverage after hours (4p -7a ) and weekends on [email or web address]

## 2015-12-02 NOTE — Progress Notes (Signed)
Patient ID: Callaway Kurowski, male   DOB: 1946/01/05, 70 y.o.   MRN: [id number] TCTS DAILY ICU PROGRESS NOTE                   [address]            [place] [address]          [phone]   3 Days Post-Op Procedure(s) (LRB): CORONARY ARTERY BYPASS GRAFTING (CABG) x four, using left internal mammary artery and right leg saphenous vein harvested endoscopically (N/A) TRANSESOPHAGEAL ECHOCARDIOGRAM (TEE) (N/A) LEFT FEMORAL ARTERIAL LINE INSERTION (Left)  Total Length of Stay:  LOS: 10 days   Subjective: Up in chair, alert  Walked around 4 times last pm  Objective: Vital signs in last 24 hours: Temp:  [97.7 F (36.5 C)-99 F (37.2 C)] 97.8 F (36.6 C) (10/08 0815) Pulse Rate:  [55-122] 117 (10/08 0500) Cardiac Rhythm: Sinus tachycardia (10/07 2358) Resp:  [0-34] 23 (10/08 0500) BP: (90-112)/(57-84) 103/72 (10/08 0500) SpO2:  [94 %-99 %] 97 % (10/08 0500) Weight:  [217 lb 6 oz (98.6 kg)] 217 lb 6 oz (98.6 kg) (10/08 0400)  Filed Weights   11/30/15 0500 12/01/15 0500 12/02/15 0400  Weight: 224 lb 3.3 oz (101.7 kg) 220 lb 14.4 oz (100.2 kg) 217 lb 6 oz (98.6 kg)    Weight change: -3 lb 8.4 oz (-1.6 kg)   Hemodynamic parameters for last 24 hours:    Intake/Output from previous day: 10/07 0701 - 10/08 0700 In: 512.4 [P.O.:480; I.V.:32.4] Out: 4000 [Urine:4000]  Intake/Output this shift: No intake/output data recorded.  Current Meds: Scheduled Meds: . acetaminophen  1,000 mg Oral Q6H   Or  . acetaminophen (TYLENOL) oral liquid 160 mg/5 mL  1,000 mg Per Tube Q6H  . aspirin EC  325 mg Oral Daily   Or  . aspirin  324 mg Per Tube Daily  . atorvastatin  80 mg Oral q1800  . bisacodyl  10 mg Oral Daily   Or  . bisacodyl  10 mg Rectal Daily  . clopidogrel  75 mg Oral Daily  . docusate sodium  200 mg Oral Daily  . enoxaparin (LOVENOX) injection  40 mg Subcutaneous QHS  . finasteride  5 mg Oral Daily  . insulin aspart  0-24 Units Subcutaneous Q4H  .  insulin detemir  15 Units Subcutaneous Daily  . mouth rinse  15 mL Mouth Rinse BID  . pantoprazole  40 mg Oral Daily  . sodium chloride flush  3 mL Intravenous Q12H  . spironolactone  12.5 mg Oral Daily   Continuous Infusions: . sodium chloride 20 mL/hr at 11/30/15 0700  . sodium chloride Stopped (11/30/15 0913)  . sodium chloride 20 mL/hr (12/01/15 1842)  . DOPamine Stopped (11/30/15 2000)  . lactated ringers 20 mL/hr at 11/30/15 0700  . lactated ringers 20 mL/hr at 11/30/15 2300  . milrinone 0.125 mcg/kg/min (12/01/15 1300)  . phenylephrine (NEO-SYNEPHRINE) Adult infusion Stopped (11/30/15 0740)   PRN Meds:.sodium chloride, lactated ringers, morphine injection, ondansetron (ZOFRAN) IV, oxyCODONE, sodium chloride flush, traMADol  General appearance: alert, cooperative and no distress Neurologic: intact Heart: regular rate and rhythm, S1, S2 normal, no murmur, click, rub or gallop Lungs: diminished breath sounds bilaterally Abdomen: soft, non-tender; bowel sounds normal; no masses,  no organomegaly Extremities: extremities normal, atraumatic, no cyanosis or edema and Homans sign is negative, no sign of DVT Wound: sternum stable  Lab Results: CBC:  Recent Labs  12/01/15 0300 12/02/15 0405  WBC 3.8* 3.2*  HGB 9.5* 9.5*  HCT 29.6* 29.1*  PLT 137* 175   BMET:   Recent Labs  12/01/15 0300 12/02/15 0405  NA 132* 133*  K 4.5 3.9  CL 98* 98*  CO2 28 29  GLUCOSE 126* 96  BUN 8 12  CREATININE 0.99 0.98  CALCIUM 8.8* 8.7*    CMET: Lab Results  Component Value Date   WBC 3.2 (L) 12/02/2015   HGB 9.5 (L) 12/02/2015   HCT 29.1 (L) 12/02/2015   PLT 175 12/02/2015   GLUCOSE 96 12/02/2015   CHOL 177 11/21/2015   TRIG 101 11/21/2015   HDL 41 11/21/2015   LDLCALC 116 (H) 11/21/2015   ALT 30 11/23/2015   AST 29 11/23/2015   NA 133 (L) 12/02/2015   K 3.9 12/02/2015   CL 98 (L) 12/02/2015   CREATININE 0.98 12/02/2015   BUN 12 12/02/2015   CO2 29 12/02/2015   TSH  1.386 11/21/2015   INR 1.30 11/29/2015   HGBA1C 5.9 (H) 11/21/2015    PT/INR:   Recent Labs  11/29/15 1259  LABPROT 16.3*  INR 1.30   Radiology: Dg Chest Port 1 View  Result Date: 12/02/2015 CLINICAL DATA:  Atelectasis EXAM: PORTABLE CHEST 1 VIEW COMPARISON:  December 01, 2015 FINDINGS: Cordis tip in superior vena cava. No pneumothorax. There is persistent right base atelectasis. Minimal left pleural effusion remains. No new opacity. Stable cardiac enlargement. Pulmonary vascularity within normal limits. No adenopathy. IMPRESSION: Stable right base atelectasis. Minimal left effusion. Stable cardiomegaly. No pneumothorax. Electronically Signed   By: Lowella Grip III M.D.   On: 12/02/2015 08:46    Assessment/Plan: S/P Procedure(s) (LRB): CORONARY ARTERY BYPASS GRAFTING (CABG) x four, using left internal mammary artery and right leg saphenous vein harvested endoscopically (N/A) TRANSESOPHAGEAL ECHOCARDIOGRAM (TEE) (N/A) LEFT FEMORAL ARTERIAL LINE INSERTION (Left) Mobilize Diuresis Dopamine off , still on milrinone  Progressing well  Renal function and H/H stable Cardiology seeing, likely wean off milrinone, still with tachycardia at rest 105-110  [name] 12/02/2015 8:58 AMPatient ID: Jamse Belfast, male   DOB: 03-06-1945, 70 y.o.   MRN: [id number]

## 2015-12-03 LAB — GLUCOSE, CAPILLARY
GLUCOSE-CAPILLARY: 103 mg/dL — AB (ref 65–99)
GLUCOSE-CAPILLARY: 116 mg/dL — AB (ref 65–99)
GLUCOSE-CAPILLARY: 121 mg/dL — AB (ref 65–99)
GLUCOSE-CAPILLARY: 82 mg/dL (ref 65–99)
Glucose-Capillary: 84 mg/dL (ref 65–99)
Glucose-Capillary: 93 mg/dL (ref 65–99)
Glucose-Capillary: 98 mg/dL (ref 65–99)

## 2015-12-03 LAB — CARBOXYHEMOGLOBIN
Carboxyhemoglobin: 1.8 % — ABNORMAL HIGH (ref 0.5–1.5)
METHEMOGLOBIN: 0.3 % (ref 0.0–1.5)
O2 Saturation: 57.8 %
Total hemoglobin: 10.7 g/dL — ABNORMAL LOW (ref 12.0–16.0)

## 2015-12-03 MED ORDER — IVABRADINE HCL 5 MG PO TABS
2.5000 mg | ORAL_TABLET | Freq: Once | ORAL | Status: AC
  Administered 2015-12-03: 2.5 mg via ORAL
  Filled 2015-12-03: qty 1

## 2015-12-03 MED ORDER — IVABRADINE HCL 5 MG PO TABS
5.0000 mg | ORAL_TABLET | Freq: Two times a day (BID) | ORAL | Status: DC
  Administered 2015-12-03 – 2015-12-05 (×4): 5 mg via ORAL
  Filled 2015-12-03 (×7): qty 1

## 2015-12-03 MED ORDER — IVABRADINE HCL 5 MG PO TABS
5.0000 mg | ORAL_TABLET | Freq: Two times a day (BID) | ORAL | Status: DC
  Filled 2015-12-03: qty 1

## 2015-12-03 MED ORDER — LOSARTAN POTASSIUM 25 MG PO TABS: 25.0000 mg | ORAL_TABLET | Freq: Every day | ORAL | Status: DC

## 2015-12-03 MED ORDER — POLYETHYLENE GLYCOL 3350 17 G PO PACK
17.0000 g | PACK | Freq: Every day | ORAL | Status: DC
  Administered 2015-12-03 – 2015-12-05 (×3): 17 g via ORAL
  Filled 2015-12-03 (×3): qty 1

## 2015-12-03 MED ORDER — LOSARTAN POTASSIUM 25 MG PO TABS
12.5000 mg | ORAL_TABLET | Freq: Once | ORAL | Status: AC
  Administered 2015-12-03: 12.5 mg via ORAL
  Filled 2015-12-03: qty 1

## 2015-12-03 MED ORDER — LOSARTAN POTASSIUM 25 MG PO TABS
25.0000 mg | ORAL_TABLET | Freq: Every day | ORAL | Status: DC
Start: 2015-12-04 — End: 2015-12-05
  Administered 2015-12-04 – 2015-12-05 (×2): 25 mg via ORAL
  Filled 2015-12-03 (×2): qty 1

## 2015-12-03 MED FILL — Dexmedetomidine HCl in NaCl 0.9% IV Soln 400 MCG/100ML: INTRAVENOUS | Qty: 100 | Status: AC

## 2015-12-03 NOTE — Progress Notes (Signed)
Advanced Heart Failure Rounding Note   Subjective:    POD 2 S/P CABG x 4  Doing well. Walking unit. Dopamine off. Coox 57.8% this am on milrinone 0.125  Feeling good. Denies SOB/Orthopnea. Still no BM. Getting bowel regimen this am.  HR improved to 80-90s, up to 100s now on coreg, dig, and ivabradine.  ECHO 10/2015 EF 20% Grade III DD Peak PA pressure 52  Severe LVH  Objective:   Weight Range:  Vital Signs:   Temp:  [97.8 F (36.6 C)-98.5 F (36.9 C)] 98.5 F (36.9 C) (10/09 0800) Pulse Rate:  [89-123] 97 (10/09 0800) Resp:  [16-32] 29 (10/09 0800) BP: (78-114)/(44-80) 97/65 (10/09 0800) SpO2:  [96 %-100 %] 97 % (10/09 0800) Weight:  [216 lb 1.6 oz (98 kg)] 216 lb 1.6 oz (98 kg) (10/09 0600) Last BM Date: 11/28/15  Weight change: Filed Weights   12/01/15 0500 12/02/15 0400 12/03/15 0600  Weight: 220 lb 14.4 oz (100.2 kg) 217 lb 6 oz (98.6 kg) 216 lb 1.6 oz (98 kg)    Intake/Output:   Intake/Output Summary (Last 24 hours) at 12/03/15 1025 Last data filed at 12/03/15 0909  Gross per 24 hour  Intake            342.2 ml  Output             1575 ml  Net          -1232.8 ml     Physical Exam: General:  Sitting in chair. No resp difficulty.  HEENT: Normal  Neck: Supple. RIJ central line. Carotids 2+ bilat; no bruits. No thyromegaly or nodule noted.  Cor: Sternal dressing in place. Tachy regular Distant  No M/G/R. Lungs: CTAB, diminished basilar sounds.  Abdomen: soft, NT, ND, no HSM. No bruits or masses. +BS  Extremities: no cyanosis, clubbing, rash, no edema Neuro: alert & orientedx3, cranial nerves grossly intact. moves all 4 extremities w/o difficulty. Affect pleasant  Telemetry: Reviewed personally, NSR 80/90s up to 100s.   Labs: Basic Metabolic Panel:  Recent Labs Lab 11/26/15 1112 11/29/15 0237  11/29/15 1953 11/29/15 1955 11/30/15 0300 11/30/15 1620 11/30/15 1623 12/01/15 0300 12/02/15 0405  NA 139 135  < > 135  --  132*  --  134* 132* 133*    K 4.3 4.6  < > 4.6  --  4.6  --  4.5 4.5 3.9  CL 100* 99*  < > 99*  --  101  --  94* 98* 98*  CO2 30 27  --   --   --  24  --   --  28 29  GLUCOSE 91 92  < > 148*  --  125*  --  135* 126* 96  BUN 20 19  < > 13  --  10  --  9 8 12   CREATININE 1.32* 1.13  < > 0.90 0.93 0.89 0.89 0.90 0.99 0.98  CALCIUM 9.6 9.5  --   --   --  8.9  --   --  8.8* 8.7*  MG  --   --   --   --  2.6* 2.3 2.0  --   --   --   < > = values in this interval not displayed.  Liver Function Tests: No results for input(s): AST, ALT, ALKPHOS, BILITOT, PROT, ALBUMIN in the last 168 hours. No results for input(s): LIPASE, AMYLASE in the last 168 hours. No results for input(s): AMMONIA in the last 168 hours.  CBC:  Recent Labs Lab 11/29/15 1955 11/30/15 0300 11/30/15 1620 11/30/15 1623 12/01/15 0300 12/02/15 0405  WBC 6.2 6.3 5.3  --  3.8* 3.2*  HGB 11.0* 10.6* 10.9* 11.2* 9.5* 9.5*  HCT 34.6* 33.2* 34.1* 33.0* 29.6* 29.1*  MCV 96.1 95.4 96.1  --  94.9 95.1  PLT 178 177 160  --  137* 175    Cardiac Enzymes: No results for input(s): CKTOTAL, CKMB, CKMBINDEX, TROPONINI in the last 168 hours.  BNP: BNP (last 3 results)  Recent Labs  11/21/15 0220 11/22/15 0357 11/23/15 0351  BNP 1,125.0* 1,040.0* 712.6*    ProBNP (last 3 results) No results for input(s): PROBNP in the last 8760 hours.    Other results:  Imaging: Dg Chest Port 1 View  Result Date: 12/02/2015 CLINICAL DATA:  Atelectasis EXAM: PORTABLE CHEST 1 VIEW COMPARISON:  December 01, 2015 FINDINGS: Cordis tip in superior vena cava. No pneumothorax. There is persistent right base atelectasis. Minimal left pleural effusion remains. No new opacity. Stable cardiac enlargement. Pulmonary vascularity within normal limits. No adenopathy. IMPRESSION: Stable right base atelectasis. Minimal left effusion. Stable cardiomegaly. No pneumothorax. Electronically Signed   By: Lowella Grip III M.D.   On: 12/02/2015 08:46     Medications:     Scheduled  Medications: . acetaminophen  1,000 mg Oral Q6H   Or  . acetaminophen (TYLENOL) oral liquid 160 mg/5 mL  1,000 mg Per Tube Q6H  . aspirin EC  325 mg Oral Daily   Or  . aspirin  324 mg Per Tube Daily  . atorvastatin  80 mg Oral q1800  . bisacodyl  10 mg Oral Daily   Or  . bisacodyl  10 mg Rectal Daily  . carvedilol  3.125 mg Oral BID WC  . clopidogrel  75 mg Oral Daily  . digoxin  0.125 mg Oral Daily  . docusate sodium  200 mg Oral Daily  . enoxaparin (LOVENOX) injection  40 mg Subcutaneous QHS  . finasteride  5 mg Oral Daily  . insulin aspart  0-24 Units Subcutaneous Q4H  . insulin detemir  15 Units Subcutaneous Daily  . ivabradine  2.5 mg Oral Once  . ivabradine  5 mg Oral BID WC  . losartan  12.5 mg Oral Once  . [START ON 12/04/2015] losartan  25 mg Oral Daily  . pantoprazole  40 mg Oral Daily  . polyethylene glycol  17 g Oral Daily  . sodium chloride flush  3 mL Intravenous Q12H  . spironolactone  12.5 mg Oral Daily    Infusions: . sodium chloride 20 mL/hr at 12/03/15 0800  . sodium chloride Stopped (11/30/15 0913)  . sodium chloride 20 mL/hr (12/01/15 1842)  . lactated ringers Stopped (12/02/15 2000)  . lactated ringers Stopped (12/02/15 2000)    PRN Medications: sodium chloride, lactated ringers, morphine injection, ondansetron (ZOFRAN) IV, oxyCODONE, sodium chloride flush, traMADol   Assessment:   1. CAD S/P CABG x4  2. ICM  3. Acute Systolic Heart Failure. EF 20% 4. Hyperlipidemia   Plan/Discussion:    S/P CABG x 4.   Stable today. Looks good. Renal function stable.  HR much improved. Hgb ok.   Stop milrinone. Do not increase coreg, continue 3.125 mg BID. He is getting sheath pulled so will have no further central access.    Increase losartan 25 mg daily. Continue spiro. Continue to hold lasix for now.   Continue digoxin 0.125 mg.   Continue to ambulate.   Length of Stay: 11  Shirley Friar, PA-C 12/03/2015, 10:25 AM  Advanced Heart  Failure Team Pager [phone] (M-F; 7a - 4p)  Please contact [facility] for night-coverage after hours (4p -7a ) and weekends on [email or web address]   Patient seen and examined with Oda Kilts, PA-C. We discussed all aspects of the encounter. I agree with the assessment and plan as stated above.   Doing well. Volume status looks good. HR remains fast but improving. Co-ox 58%. Will stop milrinone. Increase losartan. Continue digoxin, low-dose carvedilol. Increase ivabradine to 5 bid. Can go to tele.   Will give Miralax.  D/w Dr. Cyndia Bent at bedside.   Moishe Schellenberg,MD 11:26 PM

## 2015-12-03 NOTE — Progress Notes (Addendum)
4 Days Post-Op Procedure(s) (LRB): CORONARY ARTERY BYPASS GRAFTING (CABG) x four, using left internal mammary artery and right leg saphenous vein harvested endoscopically (N/A) TRANSESOPHAGEAL ECHOCARDIOGRAM (TEE) (N/A) LEFT FEMORAL ARTERIAL LINE INSERTION (Left) Subjective:  No complaints. Has not had BM postop  Objective: Vital signs in last 24 hours: Temp:  [97.8 F (36.6 C)-98.4 F (36.9 C)] 98.2 F (36.8 C) (10/09 0423) Pulse Rate:  [55-123] 100 (10/09 0700) Cardiac Rhythm: Normal sinus rhythm (10/09 0400) Resp:  [15-32] 26 (10/09 0700) BP: (78-133)/(44-103) 96/67 (10/09 0700) SpO2:  [96 %-100 %] 97 % (10/09 0700) Weight:  [98 kg (216 lb 1.6 oz)] 98 kg (216 lb 1.6 oz) (10/09 0600)  Hemodynamic parameters for last 24 hours:    Intake/Output from previous day: 10/08 0701 - 10/09 0700 In: 302.8 [I.V.:302.8] Out: 1575 [Urine:1575] Intake/Output this shift: No intake/output data recorded.  General appearance: alert and cooperative Neurologic: intact Heart: regular rate and rhythm, S1, S2 normal, no murmur, click, rub or gallop Lungs: slight rales in bases Abdomen: soft, non-tender; bowel sounds normal; no masses,  no organomegaly Extremities: extremities normal, atraumatic, no cyanosis or edema Wound: incisions ok  Lab Results:  Recent Labs  12/01/15 0300 12/02/15 0405  WBC 3.8* 3.2*  HGB 9.5* 9.5*  HCT 29.6* 29.1*  PLT 137* 175   BMET:  Recent Labs  12/01/15 0300 12/02/15 0405  NA 132* 133*  K 4.5 3.9  CL 98* 98*  CO2 28 29  GLUCOSE 126* 96  BUN 8 12  CREATININE 0.99 0.98  CALCIUM 8.8* 8.7*    PT/INR: No results for input(s): LABPROT, INR in the last 72 hours. ABG    Component Value Date/Time   PHART 7.327 (L) 11/29/2015 2207   HCO3 23.9 11/29/2015 2207   TCO2 26 11/30/2015 1623   ACIDBASEDEF 2.0 11/29/2015 2207   O2SAT 57.8 12/03/2015 0427   CBG (last 3)   Recent Labs  12/02/15 1955 12/03/15 0002 12/03/15 0421  GLUCAP 195* 82 84     Assessment/Plan: S/P Procedure(s) (LRB): CORONARY ARTERY BYPASS GRAFTING (CABG) x four, using left internal mammary artery and right leg saphenous vein harvested endoscopically (N/A) TRANSESOPHAGEAL ECHOCARDIOGRAM (TEE) (N/A) LEFT FEMORAL ARTERIAL LINE INSERTION (Left)  He is hemodynamically stable in sinus rhythm with rate 99 at rest. BP in the 90's as it was preop on low dose Coreg and Losartan and spironolactone. He is still on milrinone 0.125 with drop in Co-ox from 68.5 to 57.8 this am. I don't know what this means because nothing has changed.  He continues to diurese and weight is down to 216 which is 4 lbs over preop.   He feels well and is ambulating well.  Will see what heart failure team thinks. He either needs a PICC and continued milrinone on 2W or stop Milrinone, remove sleeve and observe on 2W.      LOS: 11 days    Gaye Pollack 12/03/2015

## 2015-12-03 NOTE — Progress Notes (Signed)
Patient arrived the unit from 2S via ambulation, assessment completed see flowsheet , placed on tele ccmd notified, patient oreinted to room and staff, bed in lowest position, call light within reach will continue to monitor

## 2015-12-03 NOTE — Progress Notes (Signed)
CARDIAC REHAB PHASE I   Pt just transferred from 2S. Pt states he has walked twice today, currently visiting with family, declines ambulation at this time. Encouraged additional ambulation x1 more today, pt verbalized understanding. Will follow-up tomorrow.   Lenna Sciara, RN, BSN 12/03/2015 2:26 PM

## 2015-12-04 LAB — CBC
HEMATOCRIT: 28.1 % — AB (ref 39.0–52.0)
HEMOGLOBIN: 8.9 g/dL — AB (ref 13.0–17.0)
MCH: 30.3 pg (ref 26.0–34.0)
MCHC: 31.7 g/dL (ref 30.0–36.0)
MCV: 95.6 fL (ref 78.0–100.0)
Platelets: 263 10*3/uL (ref 150–400)
RBC: 2.94 MIL/uL — AB (ref 4.22–5.81)
RDW: 14.7 % (ref 11.5–15.5)
WBC: 3.2 10*3/uL — AB (ref 4.0–10.5)

## 2015-12-04 LAB — GLUCOSE, CAPILLARY
GLUCOSE-CAPILLARY: 83 mg/dL (ref 65–99)
GLUCOSE-CAPILLARY: 95 mg/dL (ref 65–99)
Glucose-Capillary: 94 mg/dL (ref 65–99)
Glucose-Capillary: 100 mg/dL — ABNORMAL HIGH (ref 65–99)
Glucose-Capillary: 117 mg/dL — ABNORMAL HIGH (ref 65–99)
Glucose-Capillary: 112 mg/dL — ABNORMAL HIGH (ref 65–99)

## 2015-12-04 LAB — BASIC METABOLIC PANEL
ANION GAP: 10 (ref 5–15)
BUN: 15 mg/dL (ref 6–20)
CHLORIDE: 101 mmol/L (ref 101–111)
CO2: 26 mmol/L (ref 22–32)
Calcium: 8.9 mg/dL (ref 8.9–10.3)
Creatinine, Ser: 0.89 mg/dL (ref 0.61–1.24)
GFR calc Af Amer: >60 mL/min (ref >60)
GFR calc non Af Amer: >60 mL/min (ref >60)
GLUCOSE: 87 mg/dL (ref 65–99)
POTASSIUM: 4.1 mmol/L (ref 3.5–5.1)
Sodium: 137 mmol/L (ref 135–145)

## 2015-12-04 MED ORDER — MOVING RIGHT ALONG BOOK
Freq: Once | Status: AC
  Administered 2015-12-04: 01:00:00
  Filled 2015-12-04: qty 1

## 2015-12-04 MED ORDER — ASPIRIN EC 325 MG PO TBEC
325.0000 mg | DELAYED_RELEASE_TABLET | Freq: Every day | ORAL | Status: DC
  Administered 2015-12-04 – 2015-12-05 (×2): 325 mg via ORAL
  Filled 2015-12-04 (×2): qty 1

## 2015-12-04 MED ORDER — SODIUM CHLORIDE 0.9% FLUSH: 3.0000 mL | INTRAVENOUS | Status: DC | PRN

## 2015-12-04 MED ORDER — TRAMADOL HCL 50 MG PO TABS: 50.0000 mg | ORAL_TABLET | ORAL | Status: DC | PRN

## 2015-12-04 MED ORDER — SPIRONOLACTONE 25 MG PO TABS
25.0000 mg | ORAL_TABLET | Freq: Every day | ORAL | Status: DC
  Administered 2015-12-04: 25 mg via ORAL
  Filled 2015-12-04: qty 1

## 2015-12-04 MED ORDER — BISACODYL 5 MG PO TBEC: 10.0000 mg | DELAYED_RELEASE_TABLET | Freq: Every day | ORAL | Status: DC | PRN

## 2015-12-04 MED ORDER — ONDANSETRON HCL 4 MG PO TABS: 4.0000 mg | ORAL_TABLET | Freq: Four times a day (QID) | ORAL | Status: DC | PRN

## 2015-12-04 MED ORDER — OXYCODONE HCL 5 MG PO TABS: 5.0000 mg | ORAL_TABLET | ORAL | Status: DC | PRN

## 2015-12-04 MED ORDER — DOCUSATE SODIUM 100 MG PO CAPS
200.0000 mg | ORAL_CAPSULE | Freq: Every day | ORAL | Status: DC
  Administered 2015-12-04 – 2015-12-05 (×2): 200 mg via ORAL
  Filled 2015-12-04 (×2): qty 2

## 2015-12-04 MED ORDER — INSULIN ASPART 100 UNIT/ML ~~LOC~~ SOLN: 0.0000 [IU] | Freq: Three times a day (TID) | SUBCUTANEOUS | Status: DC

## 2015-12-04 MED ORDER — SODIUM CHLORIDE 0.9 % IV SOLN: 250.0000 mL | INTRAVENOUS | Status: DC | PRN

## 2015-12-04 MED ORDER — SODIUM CHLORIDE 0.9% FLUSH
3.0000 mL | Freq: Two times a day (BID) | INTRAVENOUS | Status: DC
  Administered 2015-12-04 (×2): 3 mL via INTRAVENOUS

## 2015-12-04 MED ORDER — PANTOPRAZOLE SODIUM 40 MG PO TBEC
40.0000 mg | DELAYED_RELEASE_TABLET | Freq: Every day | ORAL | Status: DC
  Administered 2015-12-04 – 2015-12-05 (×2): 40 mg via ORAL
  Filled 2015-12-04 (×2): qty 1

## 2015-12-04 MED ORDER — BISACODYL 10 MG RE SUPP: 10.0000 mg | Freq: Every day | RECTAL | Status: DC | PRN

## 2015-12-04 MED ORDER — ONDANSETRON HCL 4 MG/2ML IJ SOLN: 4.0000 mg | Freq: Four times a day (QID) | INTRAMUSCULAR | Status: DC | PRN

## 2015-12-04 MED ORDER — ACETAMINOPHEN 325 MG PO TABS: 650.0000 mg | ORAL_TABLET | Freq: Four times a day (QID) | ORAL | Status: DC | PRN

## 2015-12-04 NOTE — Progress Notes (Signed)
Advanced Heart Failure Rounding Note   Subjective:     S/P CABG x 4  Yesterday milrinone stopped. Also losartan and ivabradine increased.   Denies SOB. Says pain is controlled. Ambulated without difficulty. In sinus 80-90s  ECHO 10/2015 EF 20% Grade III DD Peak PA pressure 52  Severe LVH  Objective:   Weight Range:  Vital Signs:   Temp:  [98.1 F (36.7 C)-99.3 F (37.4 C)] 98.1 F (36.7 C) (10/10 0749) Pulse Rate:  [91-104] 91 (10/10 0749) Resp:  [17-30] 20 (10/10 0749) BP: (88-114)/(51-77) 112/65 (10/10 0749) SpO2:  [96 %-100 %] 98 % (10/10 0749) Weight:  [216 lb 1.6 oz (98 kg)] 216 lb 1.6 oz (98 kg) (10/10 0415) Last BM Date: 11/28/15  Weight change: Filed Weights   12/02/15 0400 12/03/15 0600 12/04/15 0415  Weight: 217 lb 6 oz (98.6 kg) 216 lb 1.6 oz (98 kg) 216 lb 1.6 oz (98 kg)    Intake/Output:   Intake/Output Summary (Last 24 hours) at 12/04/15 0810 Last data filed at 12/04/15 0454  Gross per 24 hour  Intake             26.6 ml  Output              970 ml  Net           -943.4 ml     Physical Exam: General:  Sitting in chair. No resp difficulty.  HEENT: Normal  Neck: Supple. Carotids 2+ bilat; no bruits. No thyromegaly or nodule noted.  Cor: Sternal incision approximated. Regular Distant  No M/G/R. Lungs: CTAB, diminished basilar sounds.  Abdomen: soft, NT, ND, no HSM. No bruits or masses. +BS  Extremities: no cyanosis, clubbing, rash, no edema Neuro: alert & orientedx3, cranial nerves grossly intact. moves all 4 extremities w/o difficulty. Affect pleasant  Telemetry: Reviewed personally, NSR 90s.   Labs: Basic Metabolic Panel:  Recent Labs Lab 11/29/15 0237  11/29/15 1955 11/30/15 0300 11/30/15 1620 11/30/15 1623 12/01/15 0300 12/02/15 0405 12/04/15 0402  NA 135  < >  --  132*  --  134* 132* 133* 137  K 4.6  < >  --  4.6  --  4.5 4.5 3.9 4.1  CL 99*  < >  --  101  --  94* 98* 98* 101  CO2 27  --   --  24  --   --  28 29 26   GLUCOSE  92  < >  --  125*  --  135* 126* 96 87  BUN 19  < >  --  10  --  9 8 12 15   CREATININE 1.13  < > 0.93 0.89 0.89 0.90 0.99 0.98 0.89  CALCIUM 9.5  --   --  8.9  --   --  8.8* 8.7* 8.9  MG  --   --  2.6* 2.3 2.0  --   --   --   --   < > = values in this interval not displayed.  Liver Function Tests: No results for input(s): AST, ALT, ALKPHOS, BILITOT, PROT, ALBUMIN in the last 168 hours. No results for input(s): LIPASE, AMYLASE in the last 168 hours. No results for input(s): AMMONIA in the last 168 hours.  CBC:  Recent Labs Lab 11/30/15 0300 11/30/15 1620 11/30/15 1623 12/01/15 0300 12/02/15 0405 12/04/15 0402  WBC 6.3 5.3  --  3.8* 3.2* 3.2*  HGB 10.6* 10.9* 11.2* 9.5* 9.5* 8.9*  HCT 33.2* 34.1* 33.0* 29.6*  29.1* 28.1*  MCV 95.4 96.1  --  94.9 95.1 95.6  PLT 177 160  --  137* 175 263    Cardiac Enzymes: No results for input(s): CKTOTAL, CKMB, CKMBINDEX, TROPONINI in the last 168 hours.  BNP: BNP (last 3 results)  Recent Labs  11/21/15 0220 11/22/15 0357 11/23/15 0351  BNP 1,125.0* 1,040.0* 712.6*    ProBNP (last 3 results) No results for input(s): PROBNP in the last 8760 hours.    Other results:  Imaging: No results found.   Medications:     Scheduled Medications: . acetaminophen  1,000 mg Oral Q6H   Or  . acetaminophen (TYLENOL) oral liquid 160 mg/5 mL  1,000 mg Per Tube Q6H  . aspirin EC  325 mg Oral Daily  . atorvastatin  80 mg Oral q1800  . carvedilol  3.125 mg Oral BID WC  . clopidogrel  75 mg Oral Daily  . digoxin  0.125 mg Oral Daily  . docusate sodium  200 mg Oral Daily  . enoxaparin (LOVENOX) injection  40 mg Subcutaneous QHS  . finasteride  5 mg Oral Daily  . insulin aspart  0-24 Units Subcutaneous TID AC & HS  . ivabradine  5 mg Oral BID WC  . losartan  25 mg Oral Daily  . pantoprazole  40 mg Oral QAC breakfast  . polyethylene glycol  17 g Oral Daily  . sodium chloride flush  3 mL Intravenous Q12H  . sodium chloride flush  3 mL  Intravenous Q12H  . spironolactone  12.5 mg Oral Daily    Infusions: . sodium chloride Stopped (11/30/15 0913)  . lactated ringers Stopped (12/02/15 2000)  . lactated ringers Stopped (12/02/15 2000)    PRN Medications: sodium chloride, acetaminophen, bisacodyl **OR** bisacodyl, lactated ringers, morphine injection, ondansetron **OR** ondansetron (ZOFRAN) IV, oxyCODONE, sodium chloride flush, sodium chloride flush, traMADol   Assessment:   1. CAD S/P CABG x4  2. ICM  3. Acute Systolic Heart Failure. EF 20% 4. Hyperlipidemia   Plan/Discussion:    S/P CABG x 4.   Looks great. Volume status stable. Does not need lasix at this point. Continue low dose coreg.  Continue digoxin 0.125 mg daily. Heart rate better controlled with ivabradine. Continue losartan 25 mg daily. Increase spiro to 25 mg daily. Renal function stable.     Continue to ambulate.   Length of Stay: 12  Amy Clegg, NP-C PA-C 12/04/2015, 8:10 AM  Advanced Heart Failure Team Pager [phone] (M-F; 7a - 4p)  Please contact [facility] for night-coverage after hours (4p -7a ) and weekends on [email or web address]  Patient seen and examined with Darrick Grinder, NP. We discussed all aspects of the encounter. I agree with the assessment and plan as stated above.   Doing well with current therapy. Will continue. Volume status looks good. Continue ivabradine.   Jonanthan Bolender,MD 5:38 PM

## 2015-12-04 NOTE — Progress Notes (Addendum)
[address]       [place] [address]             [phone]      5 Days Post-Op Procedure(s) (LRB): CORONARY ARTERY BYPASS GRAFTING (CABG) x four, using left internal mammary artery and right leg saphenous vein harvested endoscopically (N/A) TRANSESOPHAGEAL ECHOCARDIOGRAM (TEE) (N/A) LEFT FEMORAL ARTERIAL LINE INSERTION (Left) Subjective: Feels well, not SOB  Objective: Vital signs in last 24 hours: Temp:  [98.2 F (36.8 C)-99.3 F (37.4 C)] 98.3 F (36.8 C) (10/10 0511) Pulse Rate:  [95-104] 96 (10/10 0511) Cardiac Rhythm: Normal sinus rhythm (10/09 1900) Resp:  [17-30] 20 (10/10 0511) BP: (88-114)/(51-77) 114/76 (10/10 0511) SpO2:  [96 %-100 %] 98 % (10/10 0511) Weight:  [216 lb 1.6 oz (98 kg)] 216 lb 1.6 oz (98 kg) (10/10 0415)  Hemodynamic parameters for last 24 hours:    Intake/Output from previous day: 10/09 0701 - 10/10 0700 In: 73.8 [I.V.:73.8] Out: 970 [Urine:970] Intake/Output this shift: No intake/output data recorded.  General appearance: alert, cooperative and no distress Heart: regular rate and rhythm Lungs: clear to auscultation bilaterally Abdomen: benign Extremities: no edema Wound: incis healing well  Lab Results:  Recent Labs  12/02/15 0405 12/04/15 0402  WBC 3.2* 3.2*  HGB 9.5* 8.9*  HCT 29.1* 28.1*  PLT 175 263   BMET:  Recent Labs  12/02/15 0405 12/04/15 0402  NA 133* 137  K 3.9 4.1  CL 98* 101  CO2 29 26  GLUCOSE 96 87  BUN 12 15  CREATININE 0.98 0.89  CALCIUM 8.7* 8.9    PT/INR: No results for input(s): LABPROT, INR in the last 72 hours. ABG    Component Value Date/Time   PHART 7.327 (L) 11/29/2015 2207   HCO3 23.9 11/29/2015 2207   TCO2 26 11/30/2015 1623   ACIDBASEDEF 2.0 11/29/2015 2207   O2SAT 57.8 12/03/2015 0427   CBG (last 3)   Recent Labs  12/03/15 2323 12/04/15 0118 12/04/15 0409  GLUCAP 98 94 83    Meds Scheduled Meds: . acetaminophen  1,000 mg Oral Q6H   Or  .  acetaminophen (TYLENOL) oral liquid 160 mg/5 mL  1,000 mg Per Tube Q6H  . aspirin EC  325 mg Oral Daily  . atorvastatin  80 mg Oral q1800  . carvedilol  3.125 mg Oral BID WC  . clopidogrel  75 mg Oral Daily  . digoxin  0.125 mg Oral Daily  . docusate sodium  200 mg Oral Daily  . enoxaparin (LOVENOX) injection  40 mg Subcutaneous QHS  . finasteride  5 mg Oral Daily  . insulin aspart  0-24 Units Subcutaneous TID AC & HS  . insulin detemir  15 Units Subcutaneous Daily  . ivabradine  5 mg Oral BID WC  . losartan  25 mg Oral Daily  . pantoprazole  40 mg Oral QAC breakfast  . polyethylene glycol  17 g Oral Daily  . sodium chloride flush  3 mL Intravenous Q12H  . sodium chloride flush  3 mL Intravenous Q12H  . spironolactone  12.5 mg Oral Daily   Continuous Infusions: . sodium chloride Stopped (11/30/15 0913)  . lactated ringers Stopped (12/02/15 2000)  . lactated ringers Stopped (12/02/15 2000)   PRN Meds:.sodium chloride, acetaminophen, bisacodyl **OR** bisacodyl, lactated ringers, morphine injection, ondansetron **OR** ondansetron (ZOFRAN) IV, oxyCODONE, sodium chloride flush, sodium chloride flush, traMADol  Xrays No results found.  Assessment/Plan: S/P Procedure(s) (LRB): CORONARY ARTERY BYPASS GRAFTING (  CABG) x four, using left internal mammary artery and right leg saphenous vein harvested endoscopically (N/A) TRANSESOPHAGEAL ECHOCARDIOGRAM (TEE) (N/A) LEFT FEMORAL ARTERIAL LINE INSERTION (Left)  1 doing well 2 BP runs low at times- AHF team managing meds 3 labs are stable 4 sugars adeq controlled- d/c insulin and follow, degree of CV disease does indicate insulin resistance so may benefit from metformin /strict carbohydrate control 5 sinus with PAC's/PVC's- monitor on coreg  6 routine cardiac rehab  LOS: 12 days    GOLD,WAYNE E 12/04/2015   Chart reviewed, patient examined, agree with above. Hemodynamics are stable. He feels well and is ambulating well. No shortness  of breath.  Has not had a BM in several days.  He could go home tomorrow if he continues to feel well.

## 2015-12-04 NOTE — Care Management Important Message (Signed)
Important Message  Patient Details  Name: Shane Kim MRN: [id number] Date of Birth: Mar 09, 1945   Medicare Important Message Given:  Yes    Shane Kim 12/04/2015, 9:04 AM

## 2015-12-04 NOTE — Progress Notes (Signed)
1515 Came to see pt to walk. Stated he has walked twice already and will walk again later. Will continue to follow. Graylon Good RN BSN 12/04/2015 3:17 PM

## 2015-12-05 LAB — CBC
HEMATOCRIT: 30.7 % — AB (ref 39.0–52.0)
Hemoglobin: 10 g/dL — ABNORMAL LOW (ref 13.0–17.0)
MCH: 30.9 pg (ref 26.0–34.0)
MCHC: 32.6 g/dL (ref 30.0–36.0)
MCV: 94.8 fL (ref 78.0–100.0)
PLATELETS: 309 10*3/uL (ref 150–400)
RBC: 3.24 MIL/uL — ABNORMAL LOW (ref 4.22–5.81)
RDW: 14.7 % (ref 11.5–15.5)
WBC: 3.4 10*3/uL — AB (ref 4.0–10.5)

## 2015-12-05 LAB — BASIC METABOLIC PANEL
ANION GAP: 8 (ref 5–15)
BUN: 15 mg/dL (ref 6–20)
CALCIUM: 9.3 mg/dL (ref 8.9–10.3)
CO2: 26 mmol/L (ref 22–32)
CREATININE: 0.94 mg/dL (ref 0.61–1.24)
Chloride: 103 mmol/L (ref 101–111)
GFR calc non Af Amer: >60 mL/min (ref >60)
Glucose, Bld: 97 mg/dL (ref 65–99)
Potassium: 4.7 mmol/L (ref 3.5–5.1)
Sodium: 137 mmol/L (ref 135–145)

## 2015-12-05 LAB — GLUCOSE, CAPILLARY
GLUCOSE-CAPILLARY: 83 mg/dL (ref 65–99)
Glucose-Capillary: 98 mg/dL (ref 65–99)
Glucose-Capillary: 145 mg/dL — ABNORMAL HIGH (ref 65–99)

## 2015-12-05 MED ORDER — OXYCODONE HCL 5 MG PO TABS: 5.0000 mg | ORAL_TABLET | Freq: Four times a day (QID) | ORAL | 0 refills | Status: DC | PRN

## 2015-12-05 MED ORDER — CARVEDILOL 3.125 MG PO TABS: 3.1250 mg | ORAL_TABLET | Freq: Two times a day (BID) | ORAL | 1 refills | Status: DC

## 2015-12-05 MED ORDER — LOSARTAN POTASSIUM 25 MG PO TABS: 25.0000 mg | ORAL_TABLET | Freq: Every day | ORAL | 1 refills | Status: DC

## 2015-12-05 MED ORDER — DIGOXIN 125 MCG PO TABS: 0.1250 mg | ORAL_TABLET | Freq: Every day | ORAL | 1 refills | Status: DC

## 2015-12-05 MED ORDER — ASPIRIN EC 81 MG PO TBEC: 81.0000 mg | DELAYED_RELEASE_TABLET | Freq: Every day | ORAL | Status: DC

## 2015-12-05 MED ORDER — IVABRADINE HCL 5 MG PO TABS: 5.0000 mg | ORAL_TABLET | Freq: Two times a day (BID) | ORAL | 1 refills | Status: DC

## 2015-12-05 MED ORDER — ASPIRIN 325 MG PO TBEC: 325.0000 mg | DELAYED_RELEASE_TABLET | Freq: Every day | ORAL | Status: DC

## 2015-12-05 MED ORDER — SPIRONOLACTONE 25 MG PO TABS: 25.0000 mg | ORAL_TABLET | Freq: Every day | ORAL | 1 refills | Status: DC

## 2015-12-05 MED ORDER — ATORVASTATIN CALCIUM 80 MG PO TABS: 80.0000 mg | ORAL_TABLET | Freq: Every day | ORAL | 1 refills | Status: DC

## 2015-12-05 MED ORDER — CLOPIDOGREL BISULFATE 75 MG PO TABS: 75.0000 mg | ORAL_TABLET | Freq: Every day | ORAL | 1 refills | Status: DC

## 2015-12-05 NOTE — Progress Notes (Signed)
Advanced Heart Failure Rounding Note   Subjective:     S/P CABG x 4  Yesterday spiro increased.    Denies SOB. Ambulated multiple times in the hall. Weight unchanged.   ECHO 10/2015 EF 20% Grade III DD Peak PA pressure 52  Severe LVH  Objective:   Weight Range:  Vital Signs:   Temp:  [97.8 F (36.6 C)-98.7 F (37.1 C)] 98.5 F (36.9 C) (10/11 0353) Pulse Rate:  [84-93] 93 (10/11 0353) Resp:  [20] 20 (10/11 0353) BP: (89-113)/(63-83) 113/83 (10/11 0353) SpO2:  [97 %-100 %] 97 % (10/11 0353) Weight:  [216 lb (98 kg)] 216 lb (98 kg) (10/11 0353) Last BM Date: 11/28/15  Weight change: Filed Weights   12/03/15 0600 12/04/15 0415 12/05/15 0353  Weight: 216 lb 1.6 oz (98 kg) 216 lb 1.6 oz (98 kg) 216 lb (98 kg)    Intake/Output:   Intake/Output Summary (Last 24 hours) at 12/05/15 0724 Last data filed at 12/05/15 0631  Gross per 24 hour  Intake              480 ml  Output             1650 ml  Net            -1170 ml     Physical Exam: General:  Sitting in chair. No resp difficulty.  HEENT: Normal  Neck: Supple. Carotids 2+ bilat; no bruits. No thyromegaly or nodule noted.  Cor: Sternal incision approximated. Regular Distant  No M/G/R. Lungs: CTAB, diminished basilar sounds.  Abdomen: soft, NT, ND, no HSM. No bruits or masses. +BS  Extremities: no cyanosis, clubbing, rash, no edema Neuro: alert & orientedx3, cranial nerves grossly intact. moves all 4 extremities w/o difficulty. Affect pleasant  Telemetry: Reviewed personally, NSR 80-90s.   Labs: Basic Metabolic Panel:  Recent Labs Lab 11/29/15 1955 11/30/15 0300 11/30/15 1620 11/30/15 1623 12/01/15 0300 12/02/15 0405 12/04/15 0402 12/05/15 0321  NA  --  132*  --  134* 132* 133* 137 137  K  --  4.6  --  4.5 4.5 3.9 4.1 4.7  CL  --  101  --  94* 98* 98* 101 103  CO2  --  24  --   --  28 29 26 26   GLUCOSE  --  125*  --  135* 126* 96 87 97  BUN  --  10  --  9 8 12 15 15   CREATININE 0.93 0.89 0.89  0.90 0.99 0.98 0.89 0.94  CALCIUM  --  8.9  --   --  8.8* 8.7* 8.9 9.3  MG 2.6* 2.3 2.0  --   --   --   --   --     Liver Function Tests: No results for input(s): AST, ALT, ALKPHOS, BILITOT, PROT, ALBUMIN in the last 168 hours. No results for input(s): LIPASE, AMYLASE in the last 168 hours. No results for input(s): AMMONIA in the last 168 hours.  CBC:  Recent Labs Lab 11/30/15 1620 11/30/15 1623 12/01/15 0300 12/02/15 0405 12/04/15 0402 12/05/15 0321  WBC 5.3  --  3.8* 3.2* 3.2* 3.4*  HGB 10.9* 11.2* 9.5* 9.5* 8.9* 10.0*  HCT 34.1* 33.0* 29.6* 29.1* 28.1* 30.7*  MCV 96.1  --  94.9 95.1 95.6 94.8  PLT 160  --  137* 175 263 309    Cardiac Enzymes: No results for input(s): CKTOTAL, CKMB, CKMBINDEX, TROPONINI in the last 168 hours.  BNP: BNP (last 3 results)  Recent Labs  11/21/15 0220 11/22/15 0357 11/23/15 0351  BNP 1,125.0* 1,040.0* 712.6*    ProBNP (last 3 results) No results for input(s): PROBNP in the last 8760 hours.    Other results:  Imaging: No results found.   Medications:     Scheduled Medications: . aspirin EC  325 mg Oral Daily  . atorvastatin  80 mg Oral q1800  . carvedilol  3.125 mg Oral BID WC  . clopidogrel  75 mg Oral Daily  . digoxin  0.125 mg Oral Daily  . docusate sodium  200 mg Oral Daily  . enoxaparin (LOVENOX) injection  40 mg Subcutaneous QHS  . finasteride  5 mg Oral Daily  . insulin aspart  0-24 Units Subcutaneous TID AC & HS  . ivabradine  5 mg Oral BID WC  . losartan  25 mg Oral Daily  . pantoprazole  40 mg Oral QAC breakfast  . polyethylene glycol  17 g Oral Daily  . sodium chloride flush  3 mL Intravenous Q12H  . sodium chloride flush  3 mL Intravenous Q12H  . spironolactone  25 mg Oral Daily    Infusions: . sodium chloride Stopped (11/30/15 0913)  . lactated ringers Stopped (12/02/15 2000)  . lactated ringers Stopped (12/02/15 2000)    PRN Medications: sodium chloride, acetaminophen, bisacodyl **OR**  bisacodyl, lactated ringers, morphine injection, ondansetron **OR** ondansetron (ZOFRAN) IV, oxyCODONE, sodium chloride flush, sodium chloride flush, traMADol   Assessment:   1. CAD S/P CABG x4  2. ICM  3. Acute Systolic Heart Failure. EF 20% 4. Hyperlipidemia   Plan/Discussion:    S/P CABG x 4.   Looks great. Volume status stable. Does not need lasix at this point. Continue low dose coreg.  Continue digoxin 0.125 mg daily. Heart rate better controlled with ivabradine. Continue losartan 25 mg daily. Continue spiro to 25 mg daily.   Renal function stable.     Continue to ambulate.   HF follow up set up.   HF meds for d/c Carvedilol 3.125 mg twice a day Digoxin 0.125 mg daily Losartan 25 mg daily Spironolactone 25 mg daily Ivabradine 5 mg twice a day  Length of Stay: [place], NP-C 12/05/2015, 7:24 AM  Advanced Heart Failure Team Pager [phone] (M-F; 7a - 4p)  Please contact [facility] for night-coverage after hours (4p -7a ) and weekends on [email or web address]  Patient seen and examined with Darrick Grinder, NP. We discussed all aspects of the encounter. I agree with the assessment and plan as stated above.   He looks great. Volume status looks good. Tachycardia much improved. Renal function stable. Will d/c home on current meds. F/u in HF Clinic.   Bensimhon, Daniel,MD 3:04 PM

## 2015-12-05 NOTE — Progress Notes (Addendum)
[address]       [place] [address]             [phone]      6 Days Post-Op Procedure(s) (LRB): CORONARY ARTERY BYPASS GRAFTING (CABG) x four, using left internal mammary artery and right leg saphenous vein harvested endoscopically (N/A) TRANSESOPHAGEAL ECHOCARDIOGRAM (TEE) (N/A) LEFT FEMORAL ARTERIAL LINE INSERTION (Left) Subjective: conts to look and feel well, no BM but no discomfort  Objective: Vital signs in last 24 hours: Temp:  [97.8 F (36.6 C)-98.7 F (37.1 C)] 98.5 F (36.9 C) (10/11 0353) Pulse Rate:  [84-93] 93 (10/11 0353) Cardiac Rhythm: Normal sinus rhythm (10/10 1900) Resp:  [20] 20 (10/11 0353) BP: (89-113)/(63-83) 113/83 (10/11 0353) SpO2:  [97 %-100 %] 97 % (10/11 0353) Weight:  [216 lb (98 kg)] 216 lb (98 kg) (10/11 0353)  Hemodynamic parameters for last 24 hours:    Intake/Output from previous day: 10/10 0701 - 10/11 0700 In: 480 [P.O.:480] Out: 1650 [Urine:1650] Intake/Output this shift: No intake/output data recorded.  General appearance: alert, cooperative and no distress Heart: regular rate and rhythm Lungs: clear to auscultation bilaterally Abdomen: benign Extremities: minor edema Wound: incis healing well  Lab Results:  Recent Labs  12/04/15 0402 12/05/15 0321  WBC 3.2* 3.4*  HGB 8.9* 10.0*  HCT 28.1* 30.7*  PLT 263 309   BMET:  Recent Labs  12/04/15 0402 12/05/15 0321  NA 137 137  K 4.1 4.7  CL 101 103  CO2 26 26  GLUCOSE 87 97  BUN 15 15  CREATININE 0.89 0.94  CALCIUM 8.9 9.3    PT/INR: No results for input(s): LABPROT, INR in the last 72 hours. ABG    Component Value Date/Time   PHART 7.327 (L) 11/29/2015 2207   HCO3 23.9 11/29/2015 2207   TCO2 26 11/30/2015 1623   ACIDBASEDEF 2.0 11/29/2015 2207   O2SAT 57.8 12/03/2015 0427   CBG (last 3)   Recent Labs  12/04/15 1627 12/04/15 2100 12/05/15 0600  GLUCAP 117* 112* 98    Meds Scheduled Meds: . aspirin EC  325 mg Oral  Daily  . atorvastatin  80 mg Oral q1800  . carvedilol  3.125 mg Oral BID WC  . clopidogrel  75 mg Oral Daily  . digoxin  0.125 mg Oral Daily  . docusate sodium  200 mg Oral Daily  . enoxaparin (LOVENOX) injection  40 mg Subcutaneous QHS  . finasteride  5 mg Oral Daily  . insulin aspart  0-24 Units Subcutaneous TID AC & HS  . ivabradine  5 mg Oral BID WC  . losartan  25 mg Oral Daily  . pantoprazole  40 mg Oral QAC breakfast  . polyethylene glycol  17 g Oral Daily  . sodium chloride flush  3 mL Intravenous Q12H  . sodium chloride flush  3 mL Intravenous Q12H  . spironolactone  25 mg Oral Daily   Continuous Infusions: . sodium chloride Stopped (11/30/15 0913)  . lactated ringers Stopped (12/02/15 2000)  . lactated ringers Stopped (12/02/15 2000)   PRN Meds:.sodium chloride, acetaminophen, bisacodyl **OR** bisacodyl, lactated ringers, morphine injection, ondansetron **OR** ondansetron (ZOFRAN) IV, oxyCODONE, sodium chloride flush, sodium chloride flush, traMADol  Xrays No results found.  Assessment/Plan: S/P Procedure(s) (LRB): CORONARY ARTERY BYPASS GRAFTING (CABG) x four, using left internal mammary artery and right leg saphenous vein harvested endoscopically (N/A) TRANSESOPHAGEAL ECHOCARDIOGRAM (TEE) (N/A) LEFT FEMORAL ARTERIAL LINE INSERTION (Left)   1 doing very well  2 d/c epw's today 3 If AHF team agrees, will d/c home later today   LOS: 13 days    GOLD,WAYNE E 12/05/2015   Chart reviewed, patient examined, agree with above. He looks well so I think he can go home. He will follow up in heart failure clinic and I will see him in a couple weeks.

## 2015-12-05 NOTE — Discharge Instructions (Signed)
Heart Failure  Heart failure means your heart has trouble pumping blood. This makes it hard for your body to work well. Heart failure is usually a long-term (chronic) condition. You must take good care of yourself and follow your doctor's treatment plan.  HOME CARE   Take your heart medicine as told by your doctor.    Do not stop taking medicine unless your doctor tells you to.    Do not skip any dose of medicine.    Refill your medicines before they run out.    Take other medicines only as told by your doctor or pharmacist.   Stay active if told by your doctor. The elderly and people with severe heart failure should talk with a doctor about physical activity.   Eat heart-healthy foods. Choose foods that are without trans fat and are low in saturated fat, cholesterol, and salt (sodium). This includes fresh or frozen fruits and vegetables, fish, lean meats, fat-free or low-fat dairy foods, whole grains, and high-fiber foods. Lentils and dried peas and beans (legumes) are also good choices.   Limit salt if told by your doctor.   Cook in a healthy way. Roast, grill, broil, bake, poach, steam, or stir-fry foods.   Limit fluids as told by your doctor.   Weigh yourself every morning. Do this after you pee (urinate) and before you eat breakfast. Write down your weight to give to your doctor.   Take your blood pressure and write it down if your doctor tells you to.   Ask your doctor how to check your pulse. Check your pulse as told.   Lose weight if told by your doctor.   Stop smoking or chewing tobacco. Do not use gum or patches that help you quit without your doctor's approval.   Schedule and go to doctor visits as told.   Nonpregnant women should have no more than 1 drink a day. Men should have no more than 2 drinks a day. Talk to your doctor about drinking alcohol.   Stop illegal drug use.   Stay current with shots (immunizations).   Manage your health conditions as told by your doctor.   Learn to  manage your stress.   Rest when you are tired.   If it is really hot outside:    Avoid intense activities.    Use air conditioning or fans, or get in a cooler place.    Avoid caffeine and alcohol.    Wear loose-fitting, lightweight, and light-colored clothing.   If it is really cold outside:    Avoid intense activities.    Layer your clothing.    Wear mittens or gloves, a hat, and a scarf when going outside.    Avoid alcohol.   Learn about heart failure and get support as needed.   Get help to maintain or improve your quality of life and your ability to care for yourself as needed.  GET HELP IF:    You gain weight quickly.   You are more short of breath than usual.   You cannot do your normal activities.   You tire easily.   You cough more than normal, especially with activity.   You have any or more puffiness (swelling) in areas such as your hands, feet, ankles, or belly (abdomen).   You cannot sleep because it is hard to breathe.   You feel like your heart is beating fast (palpitations).   You get dizzy or light-headed when you stand up.  GET HELP   are not doing well or get worse.   This information is not intended to replace advice given to you by your health care provider. Make sure you discuss any questions you have with your health care provider.   Document Released: 11/20/2007 Document Revised: 03/03/2014 Document Reviewed: 03/29/2012 Elsevier Interactive Patient Education 2016 [place]. Endoscopic Saphenous Vein Harvesting, Care After Refer to this sheet in the next few weeks. These instructions provide you with information on caring for yourself after your procedure. Your health  care provider may also give you more specific instructions. Your treatment has been planned according to current medical practices, but problems sometimes occur. Call your health care provider if you have any problems or questions after your procedure. HOME CARE INSTRUCTIONS Medicine  Take whatever pain medicine your surgeon prescribes. Follow the directions carefully. Do not take over-the-counter pain medicine unless your surgeon says it is okay. Some pain medicine can cause bleeding problems for several weeks after surgery.  Follow your surgeon's instructions about driving. You will probably not be permitted to drive after heart surgery.  Take any medicines your surgeon prescribes. Any medicines you took before your heart surgery should be checked with your health care provider before you start taking them again. Wound care  If your surgeon has prescribed an elastic bandage or stocking, ask how long you should wear it.  Check the area around your surgical cuts (incisions) whenever your bandages (dressings) are changed. Look for any redness or swelling.  You will need to return to have the stitches (sutures) or staples taken out. Ask your surgeon when to do that.  Ask your surgeon when you can shower or bathe. Activity  Try to keep your legs raised when you are sitting.  Do any exercises your health care providers have given you. These may include deep breathing exercises, coughing, walking, or other exercises. SEEK MEDICAL CARE IF:  You have any questions about your medicines.  You have more leg pain, especially if your pain medicine stops working.  New or growing bruises develop on your leg.  Your leg swells, feels tight, or becomes red.  You have numbness in your leg. SEEK IMMEDIATE MEDICAL CARE IF:  Your pain gets much worse.  Blood or fluid leaks from any of the incisions.  Your incisions become warm, swollen, or red.  You have chest pain.  You have trouble  breathing.  You have a fever.  You have more pain near your leg incision. MAKE SURE YOU:  Understand these instructions.  Will watch your condition.  Will get help right away if you are not doing well or get worse.   This information is not intended to replace advice given to you by your health care provider. Make sure you discuss any questions you have with your health care provider.   Document Released: 10/23/2010 Document Revised: 03/03/2014 Document Reviewed: 10/23/2010 Elsevier Interactive Patient Education 2016 [employer] Coronary Artery Bypass Grafting, Care After These instructions give you information on caring for yourself after your procedure. Your doctor may also give you more specific instructions. Call your doctor if you have any problems or questions after your procedure.  HOME CARE  Only take medicine as told by your doctor. Take medicines exactly as told. Do not stop taking medicines or start any new medicines without talking to your doctor first.  Take your pulse as told by your doctor.  Do deep breathing as told by your doctor. Use your breathing device (incentive spirometer), if given, to  practice deep breathing several times a day. Support your chest with a pillow or your arms when you take deep breaths or cough.  Keep the area clean, dry, and protected where the surgery cuts (incisions) were made. Remove bandages (dressings) only as told by your doctor. If strips were applied to surgical area, do not take them off. They fall off on their own.  Check the surgery area daily for puffiness (swelling), redness, or leaking fluid.  If surgery cuts were made in your legs:  Avoid crossing your legs.  Avoid sitting for long periods of time. Change positions every 30 minutes.  Raise your legs when you are sitting. Place them on pillows.  Wear stockings that help keep blood clots from forming in your legs (compression stockings).  Only take sponge baths until  your doctor says it is okay to take showers. Pat the surgery area dry. Do not rub the surgery area with a washcloth or towel. Do not bathe, swim, or use a hot tub until your doctor says it is okay.  Eat foods that are high in fiber. These include raw fruits and vegetables, whole grains, beans, and nuts. Choose lean meats. Avoid canned, processed, and fried foods.  Drink enough fluids to keep your pee (urine) clear or pale yellow.  Weigh yourself every day.  Rest and limit activity as told by your doctor. You may be told to:  Stop any activity if you have chest pain, shortness of breath, changes in heartbeat, or dizziness. Get help right away if this happens.  Move around often for short amounts of time or take short walks as told by your doctor. Gradually become more active. You may need help to strengthen your muscles and build endurance.  Avoid lifting, pushing, or pulling anything heavier than 10 pounds (4.5 kg) for at least 6 weeks after surgery.  Do not drive until your doctor says it is okay.  Ask your doctor when you can go back to work.  Ask your doctor when you can begin sexual activity again.  Follow up with your doctor as told. GET HELP IF:  You have puffiness, redness, more pain, or fluid draining from the incision site.  You have a fever.  You have puffiness in your ankles or legs.  You have pain in your legs.  You gain 2 or more pounds (0.9 kg) a day.  You feel sick to your stomach (nauseous) or throw up (vomit).  You have watery poop (diarrhea). GET HELP RIGHT AWAY IF:  You have chest pain that goes to your jaw or arms.  You have shortness of breath.  You have a fast or irregular heartbeat.  You notice a "clicking" in your breastbone when you move.  You have numbness or weakness in your arms or legs.  You feel dizzy or light-headed. MAKE SURE YOU:  Understand these instructions.  Will watch your condition.  Will get help right away if you are  not doing well or get worse.   This information is not intended to replace advice given to you by your health care provider. Make sure you discuss any questions you have with your health care provider.   Document Released: 02/15/2013 Document Reviewed: 02/15/2013 Elsevier Interactive Patient Education [employer].

## 2015-12-05 NOTE — Discharge Summary (Signed)
Physician Discharge Summary  Patient ID: Shane Kim MRN: [id number] DOB/AGE: 04-19-1945 70 y.o.  Admit date: 11/22/2015 Discharge date: 12/05/2015  Admission Diagnoses:Severe coronary artery disease  Discharge Diagnoses:  Principal Problem:   Acute systolic CHF (congestive heart failure) (HCC) Active Problems:   HLD (hyperlipidemia)   Shortness of breath   Hyperglycemia   Coronary artery disease involving native coronary artery of native heart with angina pectoris ([place])   Cardiomyopathy, ischemic   Coronary artery disease  Patient Active Problem List   Diagnosis Date Noted  . Coronary artery disease 11/29/2015  . Cardiomyopathy, ischemic 11/28/2015  . Acute systolic CHF (congestive heart failure) ([place]) 11/22/2015  . CAD in native artery 11/22/2015  . Hyperglycemia 11/22/2015  . Coronary artery disease involving native coronary artery of native heart with angina pectoris ([place])   . Congestive heart failure (CHF) ([place]) 11/21/2015  . Prostate cancer ([place]) 11/21/2015  . AKI (acute kidney injury) ([place]) 11/21/2015  . HLD (hyperlipidemia) 11/21/2015  . Congestive dilated cardiomyopathy ([place])   . Shortness of breath   . Leg swelling    HPI: at time of consultation  The patient has a history of hyperlipidemia and prostate cancer s/p treatment 15 years ago who reports being in his usual good state of health until 3-4 weeks ago when he noticed increasing shortness of breath and non-productive cough. He was seen by an outside physician and diagnosed with pneumonia. He was treated with Levaquin but did not improve and subsequently developed progressive lower extremity swelling, an 8 lb weight gain, and 2 pillow orthopnea. He presented to [facility] and his BNP was 1125 with a peak troponin of 0.04. CXR showed cardiomegaly with pulmonary vascular congestion. He was diuresed with significant improvement in his edema and symptoms. An echo showed severe LV dysfunction with an EF < 20%, moderate  MR, a dilated LV, mild RV systolic dysfunction, PASP elevated at 52 mm Hg. Cardiac cath today shows severe 3-vessel coronary artery disease with 90% proximal LAD, occlusion of a large OM with collaterals filling the distal vessel and an occluded proximal RCA with collaterals filling the PDA. LVEF was 20% with elevated pulmonary artery pressures and a PCWP of 29. He was transferred to [facility] for further care. He denies any prior cardiac history. He has had no chest pain or pressure. 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    Discharged Condition: good  Hospital Course: The patient presented through the emergency department with new onset congestive heart failure. He was admitted to telemetry. Cardiology consultation was obtained an echocardiogram was ordered. He was also continued on his course of Levaquin for pneumonia. He was treated with intravenous Lasix. Cardiac catheterization was done this revealed severe three-vessel coronary artery disease with significantly impaired left ventricular function. Cardiothoracic surgical consultation was obtained with Gilford Raid M.D. who evaluated the patient and agree with recommendations to proceed with coronary artery revascularization surgery. He was medically optimized and on 11/29/2015 underwent the below described procedure. He tolerated well and was taken to the surgical intensive care unit in stable condition.  Postoperative hospital course:  Patient has progressed well. He has been followed throughout his postoperative course by the advanced heart failure team who managed him from a medical perspective. Inotropic support was weaned over time. He was aggressively diuresed. He was weaned from the ventilator without difficulty. All routine lines, monitors and drainage devices have been discontinued in the standard fashion. He has maintained sinus rhythm. His diabetes has been under good postoperative  control using standard measures. Incisions are noted be  healing well without evidence of infection. He does have an expected acute blood loss anemia which is stable . At time of discharge she is felt to be doing quite well.     Consults: cardiology, (AHF)  Significant Diagnostic Studies: TEE, cardiac cath  Treatments: surgery:  11/29/2015          Surgeon:  Gaye Pollack, MD  First Assistant: Lars Pinks,  PA-C   Preoperative Diagnosis:  Severe multi-vessel coronary artery disease with severe LV dysfunction   Postoperative Diagnosis:  Same   Procedure:  1. Median Sternotomy 2. Extracorporeal circulation 3.   Coronary artery bypass grafting x 4   Left internal mammary graft to the LAD  Sequential SVG to second diagonal and OM  SVG to PDA  4.   Endoscopic vein harvest from the right leg 5.   Insertion of left femoral arterial line   Anesthesia:  General Endotracheal  Discharge Exam: Blood pressure 109/70, pulse 95, temperature 98.5 F (36.9 C), temperature source Oral, resp. rate 20, height 6\' 3"  (1.905 m), weight 216 lb (98 kg), SpO2 97 %.  General appearance: alert, cooperative and no distress Heart: regular rate and rhythm Lungs: clear to auscultation bilaterally Abdomen: benign Extremities: minor edema Wound: incis healing well Disposition: 02-Transferred to [facility]  Discharge Instructions    Amb Referral to Phase II Cardiac  Rehab    Complete by:  As directed    Diagnosis:  Heart Failure (see criteria below if ordering Phase II)   Heart Failure Type:  Chronic Systolic & Diastolic       Medication List    STOP taking these medications   acetaminophen 325 MG tablet Commonly known as:  TYLENOL   levofloxacin 500 MG tablet Commonly known as:  LEVAQUIN     TAKE these medications   aspirin EC 81 MG tablet Take 1 tablet (81 mg total) by mouth daily.   atorvastatin 80 MG tablet Commonly known as:  LIPITOR Take 1 tablet (80 mg total) by mouth daily at 6 PM. What  changed:  medication strength  how much to take   carvedilol 3.125 MG tablet Commonly known as:  COREG Take 1 tablet (3.125 mg total) by mouth 2 (two) times daily with a meal.   clopidogrel 75 MG tablet Commonly known as:  PLAVIX Take 1 tablet (75 mg total) by mouth daily. Start taking on:  12/06/2015   digoxin 0.125 MG tablet Commonly known as:  LANOXIN Take 1 tablet (0.125 mg total) by mouth daily. Start taking on:  12/06/2015   docusate sodium 100 MG capsule Commonly known as:  COLACE Take 1 capsule (100 mg total) by mouth 2 (two) times daily. What changed:  when to take this  reasons to take this   finasteride 5 MG tablet Commonly known as:  PROSCAR Take 5 mg by mouth daily.   ivabradine 5 MG Tabs tablet Commonly known as:  CORLANOR Take 1 tablet (5 mg total) by mouth 2 (two) times daily with a meal.   losartan 25 MG tablet Commonly known as:  COZAAR Take 1 tablet (25 mg total) by mouth daily. Start taking on:  12/06/2015   oxyCODONE 5 MG immediate release tablet Commonly known as:  Oxy IR/ROXICODONE Take 1-2 tablets (5-10 mg total) by mouth every 6 (six) hours as needed for severe pain.   spironolactone 25 MG tablet Commonly known as:  ALDACTONE Take 1 tablet (25 mg  total) by mouth daily.      Follow-up Information    Glori Bickers, MD Follow up on 12/14/2015.   Specialty:  Cardiology Why:  at [place] information: [place] [address] [phone]        Gaye Pollack, MD .   Specialty:  Cardiothoracic Surgery Why:  Appointment see Dr. Cyndia Bent on 01/09/2016 at 9:30 AM. Please obtain a chest x-ray at [facility] at [facility] is located in the same office complex as the Psychologist, sport and exercise. Contact information: [address] [place] [address] [phone]        [facility] .   Specialty:  Cardiothoracic  Surgery Why:  Appointment with the nurse on 12/11/2015 for suture removal at 10 AM. Contact information: [place], [address] [place] [phone]         The patient has been discharged on:   1.Beta Blocker:  Yes [ y  ]                              No   [   ]                              If No, reason:  2.Ace Inhibitor/ARB: Yes [ y  ]                                     No  [    ]                                     If No, reason:  3.Statin:   Yes [ y  ]                  No  [   ]                  If No, reason:  4.Ecasa:  Yes  [ y  ]                  No   [   ]                  If No, reason:  Signed: Soledad Budreau E 12/05/2015, 11:35 AM

## 2015-12-05 NOTE — Progress Notes (Signed)
Pacing wires removed per order. Frequent VS set up. Patient advised on activity restrictions x 1hr.

## 2015-12-05 NOTE — Progress Notes (Signed)
[facility] to be D/C'd Home per MD order. Discussed with the patient and all questions fully answered.    VVS, Skin clean, dry and intact without evidence of skin break down, no evidence of skin tears noted.  IV catheter discontinued intact. Site without signs and symptoms of complications. Dressing and pressure applied.  An After Visit Summary was printed and given to the patient.  Patient escorted via [place], and D/C home via private auto.  Cyndra Numbers  12/05/2015 1630

## 2015-12-05 NOTE — Progress Notes (Signed)
CARDIAC REHAB PHASE I   Pt ready for discharge. Cardiac surgery discharge education completed. Reviewed IS, sternal precautions, activity progression, exercise, heart healthy diet, sodium restrictions, daily weights, CHF booklet and zone tool, and phase 2 cardiac rehab. Pt verbalized understanding, receptive to education.  Pt agrees to phase 2 cardiac rehab, will send referral to [facility] per pt request. Pt in recliner, call bell within reach.   [place], RN, BSN 12/05/2015 3:27 PM

## 2015-12-05 NOTE — Care Management Note (Signed)
Case Management Note  Patient Details  Name: Shane Kim MRN: [id number] Date of Birth: 1945/07/10  Subjective/Objective:  70 y.o. M with severe CAD. Pacing wires to be removed today and anticipate home later today                  Action/Plan: Discharge home with follow up at Cardiac Rehab. No CM needs.    Expected Discharge Date:                  Expected Discharge Plan:     In-House Referral:     Discharge planning Services  CM Consult  Post Acute Care Choice:    Choice offered to:     DME Arranged:    DME Agency:     HH Arranged:    [place] Agency:     Status of Service:  Completed, signed off  If discussed at [employer] of Stay Meetings, dates discussed:    Additional Comments:  Delrae Sawyers, RN 12/05/2015, 2:37 PM

## 2015-12-11 ENCOUNTER — Ambulatory Visit (INDEPENDENT_AMBULATORY_CARE_PROVIDER_SITE_OTHER): Payer: Self-pay | Admitting: *Deleted

## 2015-12-11 ENCOUNTER — Other Ambulatory Visit: Payer: Self-pay | Admitting: *Deleted

## 2015-12-11 ENCOUNTER — Telehealth (HOSPITAL_COMMUNITY): Payer: Self-pay

## 2015-12-11 DIAGNOSIS — Z4802 Encounter for removal of sutures: Secondary | ICD-10-CM

## 2015-12-11 DIAGNOSIS — Z951 Presence of aortocoronary bypass graft: Secondary | ICD-10-CM

## 2015-12-11 DIAGNOSIS — I2510 Atherosclerotic heart disease of native coronary artery without angina pectoris: Secondary | ICD-10-CM

## 2015-12-11 DIAGNOSIS — K5909 Other constipation: Secondary | ICD-10-CM

## 2015-12-11 MED ORDER — DOCUSATE SODIUM 100 MG PO CAPS: 100.0000 mg | ORAL_CAPSULE | Freq: Two times a day (BID) | ORAL | 0 refills | Status: DC

## 2015-12-11 NOTE — Telephone Encounter (Signed)
Received call that patient has been out of Corlanor since being [place] on the 11th.  Patient needed PA approval and patient did not have this addressed or given samples at time of discharge. Erika CHF clinical pharmacist made aware, stating patient was approved.  Erika to call patient's primary pharmacy listed in chart to see if it is waiting for him to pick up. Patient due to be seen in our clinic this Friday.  Renee Pain, RN

## 2015-12-11 NOTE — Telephone Encounter (Signed)
Corlanor 5 mg BID PA approved by [employer] commercial through 02/23/38. [employer] and verified that it went through the insurance. He can also use the manufacturer copay card for assistance with his copay.   Ruta Hinds. Velva Harman, PharmD, BCPS, CPP Clinical Pharmacist Pager: [phone] Phone: [phone] 12/11/2015 10:12 AM

## 2015-12-11 NOTE — Progress Notes (Signed)
Shane Kim comes to the office s/p CABG X 4 on 11/29/15, dc'd on 12/05/15. His sternal incision, chest tube sites and right leg evh incisions are all very well healed. Three remaining chest tube sutures were easily removed. There was some old blood seepage from the middle one. Appetite is good. Bowels are slow. He still has not received his Corlanor. I called the Heart Failure Clinic to reinforce this again. He will return as scheduled with a CXR.

## 2015-12-12 ENCOUNTER — Ambulatory Visit: Payer: Medicare Other | Admitting: Family

## 2015-12-14 ENCOUNTER — Ambulatory Visit (HOSPITAL_COMMUNITY)
Admission: RE | Admit: 2015-12-14 | Discharge: 2015-12-14 | Disposition: A | Discharge status: Home / Self Care | Payer: BLUE CROSS/BLUE SHIELD | Source: Ambulatory Visit | Attending: Internal Medicine | Admitting: Internal Medicine

## 2015-12-14 ENCOUNTER — Encounter (HOSPITAL_COMMUNITY): Payer: Self-pay | Admitting: Internal Medicine

## 2015-12-14 VITALS — BP 100/60 | HR 89 | Resp 18 | Wt 215.5 lb

## 2015-12-14 DIAGNOSIS — I2510 Atherosclerotic heart disease of native coronary artery without angina pectoris: Secondary | ICD-10-CM

## 2015-12-14 DIAGNOSIS — I5022 Chronic systolic (congestive) heart failure: Secondary | ICD-10-CM | POA: Diagnosis not present

## 2015-12-14 DIAGNOSIS — I420 Dilated cardiomyopathy: Secondary | ICD-10-CM

## 2015-12-14 DIAGNOSIS — Z951 Presence of aortocoronary bypass graft: Secondary | ICD-10-CM | POA: Diagnosis not present

## 2015-12-14 LAB — CBC
HEMATOCRIT: 31.5 % — AB (ref 39.0–52.0)
HEMOGLOBIN: 10 g/dL — AB (ref 13.0–17.0)
MCH: 30.4 pg (ref 26.0–34.0)
MCHC: 31.7 g/dL (ref 30.0–36.0)
MCV: 95.7 fL (ref 78.0–100.0)
Platelets: 365 10*3/uL (ref 150–400)
RBC: 3.29 MIL/uL — ABNORMAL LOW (ref 4.22–5.81)
RDW: 14.8 % (ref 11.5–15.5)
WBC: 3.2 10*3/uL — ABNORMAL LOW (ref 4.0–10.5)

## 2015-12-14 LAB — COMPREHENSIVE METABOLIC PANEL
ALBUMIN: 3.4 g/dL — AB (ref 3.5–5.0)
ALT: 13 U/L — ABNORMAL LOW (ref 17–63)
ANION GAP: 7 (ref 5–15)
AST: 19 U/L (ref 15–41)
Alkaline Phosphatase: 68 U/L (ref 38–126)
BILIRUBIN TOTAL: 0.9 mg/dL (ref 0.3–1.2)
BUN: 23 mg/dL — AB (ref 6–20)
CO2: 28 mmol/L (ref 22–32)
Calcium: 10.3 mg/dL (ref 8.9–10.3)
Chloride: 104 mmol/L (ref 101–111)
Creatinine, Ser: 1.33 mg/dL — ABNORMAL HIGH (ref 0.61–1.24)
GFR calc Af Amer: >60 mL/min (ref >60)
GFR calc non Af Amer: 52 mL/min — ABNORMAL LOW (ref >60)
GLUCOSE: 102 mg/dL — AB (ref 65–99)
POTASSIUM: 5.5 mmol/L — AB (ref 3.5–5.1)
SODIUM: 139 mmol/L (ref 135–145)
Total Protein: 7.7 g/dL (ref 6.5–8.1)

## 2015-12-14 NOTE — Progress Notes (Signed)
ADVANCED HF CLINIC NOTE   HPI:  [name] is a 70 y/o male with history of hyperlipidemia and prostate cancer who presented to [facility] in 10/17 with HF.  An echo showed severe LV dysfunction with an EF <20%, moderate MR, a dilated LV, mild RV systolic dysfunction, PASP elevated at 52 mm Hg. Cardiac cath showed severe 3-vessel coronary artery disease with 90% proximal LAD, occlusion of a large OM with collaterals filling the distal vessel and an occluded proximal RCA with collaterals filling the PDA. LVEF was 20% with elevated pulmonary artery pressures and a PCWP of 29. He was transferred to [facility] for further care.   Underwent CABG 11/29/15 with Dr. Cyndia Bent  LIMA to LAD, Sequential SVG to second diagonal and OM, SVG to RCA  Post op did well. Had persistent sinus tach so ivabradine added to standard HF regimen with good result. Doing well at home no complaints. No CP, DOE, edema, orthopnea or PND. Taking all meds.     Past Medical History:  Diagnosis Date  . Acute systolic CHF (congestive heart failure) ([place])    a. echo 11/21/15: EF <20%, mildly dilated LV, severe diffuse global HK, GR3DD, mod MR, LA mildly dilated, RV sys fxn mildly reduced, PASP 52 mmHg  . CAD (coronary artery disease)    a. cardiac cath 11/22/15: dLM 40%, pLAD 80-90%, mLAD sequential 40%, D2 90%, mLCx 90% at takeoff of OM1, RCA appeared occluded proximally, LVEF 20%, PCWP 29   . Chronic diastolic CHF (congestive heart failure) ([place])   . HLD (hyperlipidemia)   . Prostate cancer [facility])     Current Outpatient Prescriptions  Medication Sig Dispense Refill  . aspirin EC 81 MG tablet Take 1 tablet (81 mg total) by mouth daily.    Marland Kitchen atorvastatin (LIPITOR) 80 MG tablet Take 1 tablet (80 mg total) by mouth daily at 6 PM. 30 tablet 1  . carvedilol (COREG) 3.125 MG tablet Take 1 tablet (3.125 mg total) by mouth 2 (two) times daily with a meal. 60 tablet 1  . clopidogrel (PLAVIX) 75 MG tablet Take 1 tablet (75 mg total) by mouth  daily. 30 tablet 1  . digoxin (LANOXIN) 0.125 MG tablet Take 1 tablet (0.125 mg total) by mouth daily. 30 tablet 1  . docusate sodium (COLACE) 100 MG capsule Take 1 capsule (100 mg total) by mouth 2 (two) times daily. 30 capsule 0  . finasteride (PROSCAR) 5 MG tablet Take 5 mg by mouth daily.    . ivabradine (CORLANOR) 5 MG TABS tablet Take 1 tablet (5 mg total) by mouth 2 (two) times daily with a meal. 60 tablet 1  . losartan (COZAAR) 25 MG tablet Take 1 tablet (25 mg total) by mouth daily. 30 tablet 1  . oxyCODONE (OXY IR/ROXICODONE) 5 MG immediate release tablet Take 1-2 tablets (5-10 mg total) by mouth every 6 (six) hours as needed for severe pain. 30 tablet 0  . spironolactone (ALDACTONE) 25 MG tablet Take 1 tablet (25 mg total) by mouth daily. 30 tablet 1   No current facility-administered medications for this encounter.     No Known Allergies    Social History   Social History  . Marital status: Married    Spouse name: N/A  . Number of children: N/A  . Years of education: N/A   Occupational History  . Not on file.   Social History Main Topics  . Smoking status: Never Smoker  . Smokeless tobacco: Never Used  . Alcohol use  No  . Drug use: No  . Sexual activity: Not on file   Other Topics Concern  . Not on file   Social History Narrative   Lives at home with his wife. Independent at baseline      Family History  Problem Relation Age of Onset  . Hypertension Mother   . Healthy Father     Vitals:   12/14/15 1054  BP: 100/60  Pulse: 89  Resp: 18  SpO2: 97%  Weight: 215 lb 8 oz (97.8 kg)   Wt Readings from Last 3 Encounters:  12/14/15 215 lb 8 oz (97.8 kg)  12/05/15 216 lb (98 kg)  11/22/15 243 lb 6.4 oz (110.4 kg)    Physical Exam: General:  Sitting on exam table No resp difficulty.  HEENT: Normal  Neck: Supple. Carotids 2+ bilat; no bruits. No thyromegaly or nodule noted.  Cor: Sternal incision approximated. Regular Distant  No M/G/R. Lungs:  CTAB, Abdomen: soft, NT, ND, no HSM. No bruits or masses. +BS  Extremities: no cyanosis, clubbing, rash, no edema Neuro: alert & orientedx3, cranial nerves grossly intact. moves all 4 extremities w/o difficulty. Affect pleasant  ASSESSMENT & PLAN: 1. CAD - S/P CABG x4 on 11/29/15 --Doing well s/p CABG --Continue ASA/Plavix/b-blocker/statin --Refer to CR at [facility] 2. Chronic systolic HF due to iCM EF 20%  --NYHA II. Volume status looks good --Continue current regimen. BP too low to titrate --Will need echo in 2 months to reasses EF and need for ICD --Reinforced need for daily weights and reviewed use of sliding scale diuretics. 3. Hyperlipidemia --Continue statin  Donis Pinder,MD 11:26 AM

## 2015-12-14 NOTE — Patient Instructions (Signed)
Routine lab work today. Will notify you of abnormal results, otherwise no news is good news!  Will refer you to Cardiac Rehab at [facility]. They will call you to set up initial appointment. Address: [address], [place], [place] [address]   Phone: [phone]  Follow up 1 month with Dr. Haroldine Laws.  Do the following things EVERYDAY: 1) Weigh yourself in the morning before breakfast. Write it down and keep it in a log. 2) Take your medicines as prescribed 3) Eat low salt foods-Limit salt (sodium) to 2000 mg per day.  4) Stay as active as you can everyday 5) Limit all fluids for the day to less than 2 liters

## 2015-12-21 ENCOUNTER — Other Ambulatory Visit (HOSPITAL_COMMUNITY): Payer: Self-pay | Admitting: *Deleted

## 2015-12-21 ENCOUNTER — Ambulatory Visit (HOSPITAL_COMMUNITY)
Admission: RE | Admit: 2015-12-21 | Discharge: 2015-12-21 | Disposition: A | Discharge status: Home / Self Care | Payer: BLUE CROSS/BLUE SHIELD | Source: Ambulatory Visit | Attending: Internal Medicine | Admitting: Internal Medicine

## 2015-12-21 DIAGNOSIS — I5032 Chronic diastolic (congestive) heart failure: Secondary | ICD-10-CM | POA: Diagnosis present

## 2015-12-21 LAB — BASIC METABOLIC PANEL
Anion gap: 7 (ref 5–15)
BUN: 23 mg/dL — AB (ref 6–20)
CALCIUM: 10.1 mg/dL (ref 8.9–10.3)
CO2: 28 mmol/L (ref 22–32)
Chloride: 103 mmol/L (ref 101–111)
Creatinine, Ser: 1.12 mg/dL (ref 0.61–1.24)
GFR calc Af Amer: >60 mL/min (ref >60)
GLUCOSE: 102 mg/dL — AB (ref 65–99)
POTASSIUM: 5.1 mmol/L (ref 3.5–5.1)
Sodium: 138 mmol/L (ref 135–145)

## 2015-12-21 NOTE — Addendum Note (Signed)
Encounter addended by: Isabella Bowens on: 12/21/2015  2:46 PM<BR>    Actions taken: Charge Capture section accepted

## 2016-01-08 ENCOUNTER — Other Ambulatory Visit: Payer: Self-pay | Admitting: Surgery

## 2016-01-08 DIAGNOSIS — Z951 Presence of aortocoronary bypass graft: Secondary | ICD-10-CM

## 2016-01-09 ENCOUNTER — Ambulatory Visit (INDEPENDENT_AMBULATORY_CARE_PROVIDER_SITE_OTHER): Payer: Self-pay | Admitting: Surgery

## 2016-01-09 ENCOUNTER — Encounter: Payer: Self-pay | Admitting: Surgery

## 2016-01-09 ENCOUNTER — Ambulatory Visit
Admission: RE | Admit: 2016-01-09 | Discharge: 2016-01-09 | Disposition: A | Discharge status: Home / Self Care | Payer: BLUE CROSS/BLUE SHIELD | Source: Ambulatory Visit | Attending: Surgery | Admitting: Surgery

## 2016-01-09 VITALS — BP 95/67 | HR 77 | Resp 16 | Ht 74.0 in | Wt 217.0 lb

## 2016-01-09 DIAGNOSIS — Z951 Presence of aortocoronary bypass graft: Secondary | ICD-10-CM

## 2016-01-09 DIAGNOSIS — I2510 Atherosclerotic heart disease of native coronary artery without angina pectoris: Secondary | ICD-10-CM

## 2016-01-10 ENCOUNTER — Encounter: Payer: Self-pay | Admitting: Surgery

## 2016-01-10 NOTE — Progress Notes (Signed)
HPI: Patient returns for routine postoperative follow-up having undergone CABG x 4 on 11/29/2015. He had severe LV dysfunction preop with an EF of < 20% and presented with heart failure symptoms. His EF looked better post-bypass but he was on some milrinone and dopamine. The patient's early postoperative recovery while in the hospital was notable for an uncomplicated postop course. He was seen by Dr. Haroldine Laws and the heart failure team and weaned from inotropes without difficulty. Since hospital discharge the patient reports that he has been feeling well. He is walking without chest pain or shortness of breath and has no leg edema. He has not been weighing him self.   Current Outpatient Prescriptions  Medication Sig Dispense Refill  . aspirin EC 81 MG tablet Take 1 tablet (81 mg total) by mouth daily.    Marland Kitchen atorvastatin (LIPITOR) 80 MG tablet Take 1 tablet (80 mg total) by mouth daily at 6 PM. 30 tablet 1  . carvedilol (COREG) 3.125 MG tablet Take 1 tablet (3.125 mg total) by mouth 2 (two) times daily with a meal. 60 tablet 1  . clopidogrel (PLAVIX) 75 MG tablet Take 1 tablet (75 mg total) by mouth daily. 30 tablet 1  . digoxin (LANOXIN) 0.125 MG tablet Take 1 tablet (0.125 mg total) by mouth daily. 30 tablet 1  . docusate sodium (COLACE) 100 MG capsule Take 1 capsule (100 mg total) by mouth 2 (two) times daily. 30 capsule 0  . finasteride (PROSCAR) 5 MG tablet Take 5 mg by mouth daily.    . ivabradine (CORLANOR) 5 MG TABS tablet Take 1 tablet (5 mg total) by mouth 2 (two) times daily with a meal. 60 tablet 1  . losartan (COZAAR) 25 MG tablet Take 1 tablet (25 mg total) by mouth daily. 30 tablet 1  . spironolactone (ALDACTONE) 25 MG tablet Take 1 tablet (25 mg total) by mouth daily. 30 tablet 1  . oxyCODONE (OXY IR/ROXICODONE) 5 MG immediate release tablet Take 1-2 tablets (5-10 mg total) by mouth every 6 (six) hours as needed for severe pain. (Patient not taking: Reported on 01/09/2016) 30  tablet 0   No current facility-administered medications for this visit.     Physical Exam: BP 95/67 (BP Location: Right Arm, Patient Position: Sitting, Cuff Size: Large)   Pulse 77   Resp 16   Ht 6\' 2"  (1.88 m)   Wt 217 lb (98.4 kg)   SpO2 95% Comment: ON RA  BMI 27.86 kg/m  He looks well. Lung exam is clear. Cardiac exam shows a regular rate and rhythm with normal heart sounds. Chest incision is healing well and sternum is stable. The leg incisions are healing well and there is no peripheral edema.    Diagnostic Tests:  CLINICAL DATA:  Recent CABG  EXAM: CHEST  2 VIEW  COMPARISON:  12/02/2015  FINDINGS: Stable cardiomegaly. Improved inflation with resolved atelectasis. No pleural fluid or pneumothorax. Status post CABG.  IMPRESSION: Resolved postoperative findings.  No evidence of active disease.   Electronically Signed   By: Monte Fantasia M.D.   On: 01/09/2016 09:53   Impression:  Overall I think he is doing well. He has no heart failure symptoms at this time and feels much better. His CXR is completely clear.  I encouraged him to continue walking. He is planning to participate in cardiac rehab. I told him he could drive his car but should not lift anything heavier than 10 lbs for three months postop.  Plan:  He is going to follow up with Dr. Haroldine Laws in the heart failure clinic and will have a follow up echo to reassess his LV function in the near future.   Gaye Pollack, MD [facility] [phone]

## 2016-01-16 ENCOUNTER — Encounter (HOSPITAL_COMMUNITY): Payer: Self-pay | Admitting: Internal Medicine

## 2016-01-16 ENCOUNTER — Ambulatory Visit (HOSPITAL_COMMUNITY)
Admission: RE | Admit: 2016-01-16 | Discharge: 2016-01-16 | Disposition: A | Discharge status: Home / Self Care | Payer: BLUE CROSS/BLUE SHIELD | Source: Ambulatory Visit | Attending: Internal Medicine | Admitting: Internal Medicine

## 2016-01-16 VITALS — BP 104/58 | HR 83 | Wt 219.5 lb

## 2016-01-16 DIAGNOSIS — Z79899 Other long term (current) drug therapy: Secondary | ICD-10-CM | POA: Diagnosis not present

## 2016-01-16 DIAGNOSIS — Z7902 Long term (current) use of antithrombotics/antiplatelets: Secondary | ICD-10-CM | POA: Diagnosis not present

## 2016-01-16 DIAGNOSIS — Z951 Presence of aortocoronary bypass graft: Secondary | ICD-10-CM | POA: Diagnosis not present

## 2016-01-16 DIAGNOSIS — I255 Ischemic cardiomyopathy: Secondary | ICD-10-CM | POA: Diagnosis not present

## 2016-01-16 DIAGNOSIS — E785 Hyperlipidemia, unspecified: Secondary | ICD-10-CM | POA: Diagnosis not present

## 2016-01-16 DIAGNOSIS — I2510 Atherosclerotic heart disease of native coronary artery without angina pectoris: Secondary | ICD-10-CM | POA: Diagnosis not present

## 2016-01-16 DIAGNOSIS — Z8546 Personal history of malignant neoplasm of prostate: Secondary | ICD-10-CM | POA: Diagnosis not present

## 2016-01-16 DIAGNOSIS — I5021 Acute systolic (congestive) heart failure: Secondary | ICD-10-CM | POA: Diagnosis not present

## 2016-01-16 DIAGNOSIS — K5909 Other constipation: Secondary | ICD-10-CM | POA: Diagnosis not present

## 2016-01-16 DIAGNOSIS — Z7982 Long term (current) use of aspirin: Secondary | ICD-10-CM | POA: Diagnosis not present

## 2016-01-16 DIAGNOSIS — I5022 Chronic systolic (congestive) heart failure: Secondary | ICD-10-CM | POA: Diagnosis not present

## 2016-01-16 MED ORDER — FINASTERIDE 5 MG PO TABS: 5.0000 mg | ORAL_TABLET | Freq: Every day | ORAL | 3 refills | Status: DC

## 2016-01-16 MED ORDER — ASPIRIN EC 81 MG PO TBEC: 81.0000 mg | DELAYED_RELEASE_TABLET | Freq: Every day | ORAL | 3 refills | Status: DC

## 2016-01-16 MED ORDER — SPIRONOLACTONE 25 MG PO TABS: 25.0000 mg | ORAL_TABLET | Freq: Every day | ORAL | 3 refills | Status: DC

## 2016-01-16 MED ORDER — DOCUSATE SODIUM 100 MG PO CAPS: 100.0000 mg | ORAL_CAPSULE | Freq: Two times a day (BID) | ORAL | 3 refills | Status: DC

## 2016-01-16 MED ORDER — IVABRADINE HCL 5 MG PO TABS
5.0000 mg | ORAL_TABLET | Freq: Two times a day (BID) | ORAL | 3 refills | Status: DC
Start: 2016-01-16 — End: 2016-03-20

## 2016-01-16 NOTE — Patient Instructions (Signed)
Follow up and Echo with Dr.Bensimhonin 4-6 weeks

## 2016-01-16 NOTE — Progress Notes (Signed)
ADVANCED HF CLINIC NOTE   HPI:  Shane Kim is a 70 y/o male with history of hyperlipidemia and prostate cancer who presented to [facility] in 10/17 with HF.  An echo showed severe LV dysfunction with an EF <20%, moderate MR, a dilated LV, mild RV systolic dysfunction, PASP elevated at 52 mm Hg. Cardiac cath showed severe 3-vessel coronary artery disease with 90% proximal LAD, occlusion of a large OM with collaterals filling the distal vessel and an occluded proximal RCA with collaterals filling the PDA. LVEF was 20% with elevated pulmonary artery pressures and a PCWP of 29. He was transferred to [facility] for further care.   Underwent CABG 11/29/15 with Dr. Cyndia Bent  LIMA to LAD, Sequential SVG to second diagonal and OM, SVG to RCA  Post op did well. Had persistent sinus tach so ivabradine added to standard HF regimen with good result.   He presents today for post hospital follow up. Overall continues to feel great since his CABG. Weight up 4 lbs since last visit.  Weight at home stable around 210 since discharge.   Though, he has been out of some of his beds for about 1 week.  Pt does not want to do cardiac rehab.  Too much of a time commitment.  Walks for about 30 minutes every day. Denies SOB. No bendopnea, orthopnea, PND, edema.  No CP or palpitations.    Past Medical History:  Diagnosis Date  . Acute systolic CHF (congestive heart failure) ([place])    a. echo 11/21/15: EF <20%, mildly dilated LV, severe diffuse global HK, GR3DD, mod MR, LA mildly dilated, RV sys fxn mildly reduced, PASP 52 mmHg  . CAD (coronary artery disease)    a. cardiac cath 11/22/15: dLM 40%, pLAD 80-90%, mLAD sequential 40%, D2 90%, mLCx 90% at takeoff of OM1, RCA appeared occluded proximally, LVEF 20%, PCWP 29   . Chronic diastolic CHF (congestive heart failure) ([place])   . HLD (hyperlipidemia)   . Prostate cancer [facility])     Current Outpatient Prescriptions  Medication Sig Dispense Refill  . carvedilol (COREG) 3.125 MG tablet  Take 1 tablet (3.125 mg total) by mouth 2 (two) times daily with a meal. 60 tablet 1  . clopidogrel (PLAVIX) 75 MG tablet Take 1 tablet (75 mg total) by mouth daily. 30 tablet 1  . digoxin (LANOXIN) 0.125 MG tablet Take 1 tablet (0.125 mg total) by mouth daily. 30 tablet 1  . losartan (COZAAR) 25 MG tablet Take 1 tablet (25 mg total) by mouth daily. 30 tablet 1  . aspirin EC 81 MG tablet Take 1 tablet (81 mg total) by mouth daily. (Patient not taking: Reported on 01/16/2016)    . atorvastatin (LIPITOR) 80 MG tablet Take 1 tablet (80 mg total) by mouth daily at 6 PM. (Patient not taking: Reported on 01/16/2016) 30 tablet 1  . docusate sodium (COLACE) 100 MG capsule Take 1 capsule (100 mg total) by mouth 2 (two) times daily. (Patient not taking: Reported on 01/16/2016) 30 capsule 0  . finasteride (PROSCAR) 5 MG tablet Take 5 mg by mouth daily.    . ivabradine (CORLANOR) 5 MG TABS tablet Take 1 tablet (5 mg total) by mouth 2 (two) times daily with a meal. (Patient not taking: Reported on 01/16/2016) 60 tablet 1  . oxyCODONE (OXY IR/ROXICODONE) 5 MG immediate release tablet Take 1-2 tablets (5-10 mg total) by mouth every 6 (six) hours as needed for severe pain. (Patient not taking: Reported on 01/16/2016) 30 tablet  0  . spironolactone (ALDACTONE) 25 MG tablet Take 1 tablet (25 mg total) by mouth daily. (Patient not taking: Reported on 01/16/2016) 30 tablet 1   No current facility-administered medications for this encounter.     No Known Allergies    Social History   Social History  . Marital status: Married    Spouse name: N/A  . Number of children: N/A  . Years of education: N/A   Occupational History  . Not on file.   Social History Main Topics  . Smoking status: Never Smoker  . Smokeless tobacco: Never Used  . Alcohol use No  . Drug use: No  . Sexual activity: Not on file   Other Topics Concern  . Not on file   Social History Narrative   Lives at home with his wife. Independent  at baseline      Family History  Problem Relation Age of Onset  . Hypertension Mother   . Healthy Father     Vitals:   01/16/16 0949  BP: (!) 104/58  Pulse: 83  SpO2: 99%  Weight: 219 lb 8 oz (99.6 kg)   Wt Readings from Last 3 Encounters:  01/16/16 219 lb 8 oz (99.6 kg)  01/09/16 217 lb (98.4 kg)  12/14/15 215 lb 8 oz (97.8 kg)    Physical Exam: General:  NAD. Seated on exam table.   HEENT: Normal  Neck: Supple. JVP 6-7 cm.  Carotids 2+ bilat; no bruits. No thyromegaly or nodule noted.  Cor: Sternum stable. Regular No M/G/R. Lungs: CTAB, normal effort Abdomen: soft, NT, ND, no HSM. No bruits or masses. +BS  Extremities: no cyanosis, clubbing, rash, Trace BLE edema.  Neuro: alert & orientedx3, cranial nerves grossly intact. moves all 4 extremities w/o difficulty. Affect pleasant  ASSESSMENT & PLAN: 1. CAD - S/P CABG x4 on 11/29/15 - Doing well s/p CABG - Continue ASA/Plavix/b-blocker/statin - Very active. Recommend at least 30 minutes of activity a day.  2. Chronic systolic HF due to iCM EF 20%  - NYHA I-II. Volume status mildly elevated in setting of being off spiro.  - Resume spiro 25 mg daily - Resume corlanor 5 mg BID.  - No room to up-titrate meds with resumption of meds as above.  - Will repeat Echo in 4-6 weeks to reasses EF and need for ICD - Reinforced need for daily weights and reviewed use of sliding scale diuretics. 3. Hyperlipidemia --Continue statin  Follow up 4-6 weeks with Echo.   Shirley Friar, PA-C 10:04 AM  Patient seen and examined with Oda Kilts, PA-C. We discussed all aspects of the encounter. I agree with the assessment and plan as stated above.   Overall doing well. NYHA I-II. Resume spiro and ivabradine. Repeat echo at next visit to reassess EF and decided on ICD.   Malkie Wille,MD 12:57 PM

## 2016-02-28 ENCOUNTER — Ambulatory Visit (HOSPITAL_COMMUNITY): Payer: BLUE CROSS/BLUE SHIELD

## 2016-02-28 ENCOUNTER — Encounter (HOSPITAL_COMMUNITY): Payer: Medicare Other | Admitting: Internal Medicine

## 2016-03-20 ENCOUNTER — Ambulatory Visit (HOSPITAL_BASED_OUTPATIENT_CLINIC_OR_DEPARTMENT_OTHER)
Admission: RE | Admit: 2016-03-20 | Discharge: 2016-03-20 | Disposition: A | Discharge status: Home / Self Care | Payer: BLUE CROSS/BLUE SHIELD | Source: Ambulatory Visit | Attending: Internal Medicine | Admitting: Internal Medicine

## 2016-03-20 ENCOUNTER — Encounter (HOSPITAL_COMMUNITY): Payer: Self-pay | Admitting: Internal Medicine

## 2016-03-20 ENCOUNTER — Ambulatory Visit (HOSPITAL_COMMUNITY)
Admission: RE | Admit: 2016-03-20 | Discharge: 2016-03-20 | Disposition: A | Discharge status: Home / Self Care | Payer: BLUE CROSS/BLUE SHIELD | Source: Ambulatory Visit | Attending: Internal Medicine | Admitting: Internal Medicine

## 2016-03-20 VITALS — BP 112/68 | HR 87 | Wt 212.5 lb

## 2016-03-20 DIAGNOSIS — I2510 Atherosclerotic heart disease of native coronary artery without angina pectoris: Secondary | ICD-10-CM | POA: Diagnosis not present

## 2016-03-20 DIAGNOSIS — E785 Hyperlipidemia, unspecified: Secondary | ICD-10-CM | POA: Insufficient documentation

## 2016-03-20 DIAGNOSIS — Z9889 Other specified postprocedural states: Secondary | ICD-10-CM | POA: Insufficient documentation

## 2016-03-20 DIAGNOSIS — I5022 Chronic systolic (congestive) heart failure: Secondary | ICD-10-CM

## 2016-03-20 DIAGNOSIS — I5042 Chronic combined systolic (congestive) and diastolic (congestive) heart failure: Secondary | ICD-10-CM | POA: Diagnosis not present

## 2016-03-20 DIAGNOSIS — Z951 Presence of aortocoronary bypass graft: Secondary | ICD-10-CM | POA: Diagnosis not present

## 2016-03-20 DIAGNOSIS — Z79899 Other long term (current) drug therapy: Secondary | ICD-10-CM | POA: Diagnosis not present

## 2016-03-20 DIAGNOSIS — I5021 Acute systolic (congestive) heart failure: Secondary | ICD-10-CM | POA: Diagnosis not present

## 2016-03-20 DIAGNOSIS — Z7982 Long term (current) use of aspirin: Secondary | ICD-10-CM | POA: Diagnosis not present

## 2016-03-20 DIAGNOSIS — Z5189 Encounter for other specified aftercare: Secondary | ICD-10-CM | POA: Insufficient documentation

## 2016-03-20 MED ORDER — CARVEDILOL 3.125 MG PO TABS: 3.1250 mg | ORAL_TABLET | Freq: Two times a day (BID) | ORAL | 6 refills | Status: DC

## 2016-03-20 MED ORDER — LOSARTAN POTASSIUM 25 MG PO TABS: 25.0000 mg | ORAL_TABLET | Freq: Every day | ORAL | 6 refills | Status: DC

## 2016-03-20 NOTE — Addendum Note (Signed)
Encounter addended by: Scarlette Calico, RN on: 03/20/2016  2:42 PM<BR>    Actions taken: Order list changed, Sign clinical note

## 2016-03-20 NOTE — Patient Instructions (Signed)
Start Losartan 25 mg daily  Start Carvedilol 3.125 mg Twice daily   Your physician recommends that you schedule a follow-up appointment in: 4 weeks with Abbe Amsterdam D  Your physician recommends that you schedule a follow-up appointment in: 3 months

## 2016-03-20 NOTE — Progress Notes (Signed)
ADVANCED HF CLINIC NOTE   HPI:  Shane Kim is a 71 y/o male with history of hyperlipidemia and prostate cancer who presented to [facility] in 10/17 with HF.  An echo showed severe LV dysfunction with an EF <20%, moderate MR, a dilated LV, mild RV systolic dysfunction, PASP elevated at 52 mm Hg. Cardiac cath showed severe 3-vessel coronary artery disease with 90% proximal LAD, occlusion of a large OM with collaterals filling the distal vessel and an occluded proximal RCA with collaterals filling the PDA. LVEF was 20% with elevated pulmonary artery pressures and a PCWP of 29. He was transferred to [facility] for further care.   Underwent CABG 11/29/15 with Dr. Cyndia Bent  LIMA to LAD, Sequential SVG to second diagonal and OM, SVG to RCA  Post op did well. Had persistent sinus tach so ivabradine added to standard HF regimen with good result.   He presents today for follow up.Has not been seen in several months. Ran out of most of his medicines and no refills available.  Overall continues to feel great since his CABG. Weight down 7 lbs since last visit.  Weight at home 212  [facility] for about 30-40 minutes every day. Denies SOB. No bendopnea, orthopnea, PND, edema.  No CP or palpitations.   Echo today reviewed personally EF 25-30% RV normal    Past Medical History:  Diagnosis Date  . Acute systolic CHF (congestive heart failure) ([place])    a. echo 11/21/15: EF <20%, mildly dilated LV, severe diffuse global HK, GR3DD, mod MR, LA mildly dilated, RV sys fxn mildly reduced, PASP 52 mmHg  . CAD (coronary artery disease)    a. cardiac cath 11/22/15: dLM 40%, pLAD 80-90%, mLAD sequential 40%, D2 90%, mLCx 90% at takeoff of OM1, RCA appeared occluded proximally, LVEF 20%, PCWP 29   . Chronic diastolic CHF (congestive heart failure) ([place])   . HLD (hyperlipidemia)   . Prostate cancer [facility])     Current Outpatient Prescriptions  Medication Sig Dispense Refill  . aspirin EC 81 MG tablet Take 1 tablet (81 mg total) by mouth  daily. 30 tablet 3  . atorvastatin (LIPITOR) 80 MG tablet Take 1 tablet (80 mg total) by mouth daily at 6 PM. 30 tablet 1  . finasteride (PROSCAR) 5 MG tablet Take 1 tablet (5 mg total) by mouth daily. 30 tablet 3  . spironolactone (ALDACTONE) 25 MG tablet Take 1 tablet (25 mg total) by mouth daily. 30 tablet 3  . oxyCODONE (OXY IR/ROXICODONE) 5 MG immediate release tablet Take 1-2 tablets (5-10 mg total) by mouth every 6 (six) hours as needed for severe pain. (Patient not taking: Reported on 03/20/2016) 30 tablet 0   No current facility-administered medications for this encounter.     No Known Allergies    Social History   Social History  . Marital status: Married    Spouse name: N/A  . Number of children: N/A  . Years of education: N/A   Occupational History  . Not on file.   Social History Main Topics  . Smoking status: Never Smoker  . Smokeless tobacco: Never Used  . Alcohol use No  . Drug use: No  . Sexual activity: Not on file   Other Topics Concern  . Not on file   Social History Narrative   Lives at home with his wife. Independent at baseline      Family History  Problem Relation Age of Onset  . Hypertension Mother   . Healthy Father  Vitals:   03/20/16 1421  BP: 112/68  Pulse: 87  SpO2: 99%  Weight: 212 lb 8 oz (96.4 kg)   Wt Readings from Last 3 Encounters:  03/20/16 212 lb 8 oz (96.4 kg)  01/16/16 219 lb 8 oz (99.6 kg)  01/09/16 217 lb (98.4 kg)    Physical Exam: General:  NAD.  HEENT: Normal  Neck: Supple. JVP 6-7 cm.  Carotids 2+ bilat; no bruits. No thyromegaly or nodule noted.  Cor: Sternum stable. Regular No M/G/R. Lungs: CTAB, normal effort Abdomen: soft, NT, ND, no HSM. No bruits or masses. +BS  Extremities: no cyanosis, clubbing, rash, Trace BLE edema.  Neuro: alert & orientedx3, cranial nerves grossly intact. moves all 4 extremities w/o difficulty. Affect pleasant  ASSESSMENT & PLAN: 1. CAD - S/P CABG x4 on 11/29/15 - Doing  well s/p CABG despite being out of meds - Continue ASA and atorva - Very active. Recommend at least 30 minutes of activity a day.  2. Chronic systolic HF due to iCM EF 20% . EF 25-30% on echo today - NYHA I-II. VDespite being off meds.  - Restart carvedilol 3.125 bid and losartan 25  - Will need to repeat Echo in 2-3 months on medical therapy weeks to reasses need for ICD - Reinforced need for daily weights and reviewed use of sliding scale diuretics. 3. Hyperlipidemia --Continue statin  F/u in 4 weeks with PharmD appoint for med titration   Glori Bickers, MD 2:32 PM

## 2016-03-20 NOTE — Progress Notes (Signed)
Echocardiogram 2D Echocardiogram has been performed.  Shane Kim 03/20/2016, 1:26 PM

## 2016-03-27 ENCOUNTER — Telehealth: Payer: Self-pay | Admitting: Licensed Clinical Social Worker

## 2016-03-27 NOTE — Telephone Encounter (Addendum)
SW Intern called pt after receiving referral for PCP. SW Intern spoke with pt wife and gave information on local [employer]. Pt wife requested a list of [facility] in the [place] [facility] be mailed to her for further review so she can make appt for pt. SW Intern will mail list of offices today and follow up as needed. Whitley Intern  [place], [place] [phone]

## 2016-04-03 ENCOUNTER — Telehealth (HOSPITAL_COMMUNITY): Payer: Self-pay

## 2016-04-03 NOTE — Telephone Encounter (Signed)
Patient's wife made aware of completion of disability paperwork. Asked to come pick up paperwork. Copy of forms scanned into patient's electronic medical record. Original papers and forms placed in sealed envelop at front desk.  Renee Pain, RN

## 2016-04-16 ENCOUNTER — Ambulatory Visit (HOSPITAL_COMMUNITY)
Admission: RE | Admit: 2016-04-16 | Discharge: 2016-04-16 | Disposition: A | Discharge status: Home / Self Care | Payer: BLUE CROSS/BLUE SHIELD | Source: Ambulatory Visit | Attending: Cardiology | Admitting: Cardiology

## 2016-04-16 DIAGNOSIS — I5022 Chronic systolic (congestive) heart failure: Secondary | ICD-10-CM | POA: Diagnosis present

## 2016-04-16 LAB — BASIC METABOLIC PANEL
Anion gap: 8 (ref 5–15)
BUN: 15 mg/dL (ref 6–20)
CALCIUM: 10.5 mg/dL — AB (ref 8.9–10.3)
CO2: 28 mmol/L (ref 22–32)
CREATININE: 1.28 mg/dL — AB (ref 0.61–1.24)
Chloride: 106 mmol/L (ref 101–111)
GFR calc non Af Amer: 54 mL/min — ABNORMAL LOW (ref >60)
Glucose, Bld: 94 mg/dL (ref 65–99)
Potassium: 5 mmol/L (ref 3.5–5.1)
SODIUM: 142 mmol/L (ref 135–145)

## 2016-04-16 MED ORDER — CARVEDILOL 6.25 MG PO TABS: 6.2500 mg | ORAL_TABLET | Freq: Two times a day (BID) | ORAL | 6 refills | Status: DC

## 2016-04-16 MED ORDER — ATORVASTATIN CALCIUM 80 MG PO TABS: 80.0000 mg | ORAL_TABLET | Freq: Every day | ORAL | 11 refills | Status: DC

## 2016-04-16 NOTE — Patient Instructions (Signed)
It was great to meet you today!  Please INCREASE carvedilol to 6.25 mg TWICE DAILY.   Labs today. We will call you with any abnormalities.  Please keep your appointment with the PA/NP on 06/18/16.

## 2016-04-16 NOTE — Progress Notes (Signed)
HF MD: BENSIMHON  HPI:  Mr. Demus is a 71 y/o AA male with history of hyperlipidemia and prostate cancer who presented to [facility] in 10/17 with HF.  An echo showed severe LV dysfunction with an EF <20%, moderate MR, a dilated LV, mild RV systolic dysfunction, PASP elevated at 52 mm Hg. Cardiac cath showed severe 3-vessel coronary artery disease with 90% proximal LAD, occlusion of a large OM with collaterals filling the distal vessel and an occluded proximal RCA with collaterals filling the PDA. Underwent CABG 11/29/15 with Dr. Cyndia Bent  LIMA to LAD, Sequential SVG to second diagonal and OM, SVG to RCA. Post op did well. Had persistent sinus tach so ivabradine added to standard HF regimen with good result.   He presents today for pharmacist-led HF medication titration. At his last HF clinic visit on 03/20/16, his carvedilol 3.125 mg BID and losartan 25 mg daily were restarted. Prior to this, he stated that he had run out of most of his medicines and no refills were available. Overall continues to feel great since his CABG. Walks for about 30-40 minutes every day (yesterday walked over a mile). 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 Will discuss with Dr. Haroldine Laws and he will bring back his disability paperwork that Jinny Blossom recently completed.     . Shortness of breath/dyspnea on exertion? no  . Orthopnea/PND? no . Edema? no . Lightheadedness/dizziness? no . Daily weights at home? Yes - stable ~212-215 lb . Blood pressure/heart rate monitoring at home? no . Following low-sodium/fluid-restricted diet? Yes - does not add salt and does not eat any canned foods   HF Medications: Carvedilol 3.125 mg PO BID Losartan 25 mg PO daily  Spironolactone 25 mg PO daily   Has the patient been experiencing any side effects to the medications  prescribed?  no  Does the patient have any problems obtaining medications due to transportation or finances?   No - Medicare A/B and BCBS [place] commercial Rx  Understanding of regimen: fair Understanding of indications: fair Potential of compliance: good Patient understands to avoid NSAIDs. Patient understands to avoid decongestants.    Pertinent Lab Values: . 04/16/16: Serum creatinine 1.28 (BL ~1), BUN 15, Potassium 5.0, Sodium 142   Vital Signs: . Weight: 211.4 lb (dry weight: 212-215 lb) . Blood pressure: 120/78 mmHg  . Heart rate: 76 bpm    Assessment: 1. Chronicsystolic CHF (EF [id number] on ECHO 03/20/16), due to ICM. NYHA class Isymptoms.  - Volume status stable  - Increase carvedilol to 6.25 mg BID  - Continue losartan 25 mg daily and spironolactone 25 mg daily (need to monitor K closely since borderline high)  - Could consider addition of Bidil but with history of non-compliance, would attempt to optimize current regimen first - Basic disease state pathophysiology, medication indication, mechanism and side effects reviewed at length with patient and he verbalized understanding 2. CAD s/p CABG x 4 on 11/29/15  - Continue ASA and restart atorvastatin 3. Hyperlipidemia  - Last lipid panel on 11/21/15 - LDL above goal at 116  - Restart high potency atorvastatin (ran out of refills about a month ago)  Plan: 1) Medication changes: Based on clinical presentation, vital signs and recent labs will increase carvedilol to 6.25 mg BID 2) Labs: BMET today  3)  Follow-up: PA/NP clinic on 06/18/16   Ruta Hinds. Velva Harman, PharmD, BCPS, CPP Clinical Pharmacist Pager: [phone] Phone: [phone] 04/16/2016 1:12 PM   Doing well. Agree with increasing carvedilol.    Bensimhon, Daniel,MD 3:39 PM

## 2016-04-23 ENCOUNTER — Telehealth (HOSPITAL_COMMUNITY): Payer: Self-pay | Admitting: *Deleted

## 2016-04-23 NOTE — Telephone Encounter (Signed)
Received letter from claim examiner with [employer] regarding short term disability for patient.  Requested office visit notes from Apr 16, 2016.  Records faxed back as requested to [phone].

## 2016-05-16 ENCOUNTER — Other Ambulatory Visit (HOSPITAL_COMMUNITY): Payer: Self-pay | Admitting: Internal Medicine

## 2016-05-16 DIAGNOSIS — K5909 Other constipation: Secondary | ICD-10-CM

## 2016-06-03 ENCOUNTER — Telehealth (HOSPITAL_COMMUNITY): Payer: Self-pay | Admitting: *Deleted

## 2016-06-03 NOTE — Telephone Encounter (Signed)
Received paperwork regarding disability from [employer].  I have reached out to patient on two separate occasions.  I called patient this morning and left another VM asking for him to call us back regarding questions to complete some disability work.  Paperwork will be completed once patient returns my call.

## 2016-06-18 ENCOUNTER — Ambulatory Visit (HOSPITAL_COMMUNITY)
Admission: RE | Admit: 2016-06-18 | Discharge: 2016-06-18 | Disposition: A | Discharge status: Home / Self Care | Payer: Medicare PPO | Source: Ambulatory Visit | Attending: Internal Medicine | Admitting: Internal Medicine

## 2016-06-18 VITALS — BP 120/72 | HR 76 | Wt 227.4 lb

## 2016-06-18 DIAGNOSIS — E785 Hyperlipidemia, unspecified: Secondary | ICD-10-CM | POA: Diagnosis not present

## 2016-06-18 DIAGNOSIS — Z8546 Personal history of malignant neoplasm of prostate: Secondary | ICD-10-CM | POA: Insufficient documentation

## 2016-06-18 DIAGNOSIS — I2510 Atherosclerotic heart disease of native coronary artery without angina pectoris: Secondary | ICD-10-CM | POA: Diagnosis not present

## 2016-06-18 DIAGNOSIS — Z951 Presence of aortocoronary bypass graft: Secondary | ICD-10-CM | POA: Diagnosis not present

## 2016-06-18 DIAGNOSIS — N182 Chronic kidney disease, stage 2 (mild): Secondary | ICD-10-CM | POA: Diagnosis not present

## 2016-06-18 DIAGNOSIS — Z79899 Other long term (current) drug therapy: Secondary | ICD-10-CM | POA: Insufficient documentation

## 2016-06-18 DIAGNOSIS — I5022 Chronic systolic (congestive) heart failure: Secondary | ICD-10-CM | POA: Insufficient documentation

## 2016-06-18 DIAGNOSIS — Z7982 Long term (current) use of aspirin: Secondary | ICD-10-CM | POA: Insufficient documentation

## 2016-06-18 LAB — BASIC METABOLIC PANEL
Anion gap: 8 (ref 5–15)
BUN: 16 mg/dL (ref 6–20)
CHLORIDE: 106 mmol/L (ref 101–111)
CO2: 24 mmol/L (ref 22–32)
Calcium: 10 mg/dL (ref 8.9–10.3)
Creatinine, Ser: 1.07 mg/dL (ref 0.61–1.24)
GFR calc Af Amer: >60 mL/min (ref >60)
GFR calc non Af Amer: >60 mL/min (ref >60)
GLUCOSE: 104 mg/dL — AB (ref 65–99)
POTASSIUM: 4.3 mmol/L (ref 3.5–5.1)
Sodium: 138 mmol/L (ref 135–145)

## 2016-06-18 MED ORDER — CARVEDILOL 6.25 MG PO TABS: 9.3750 mg | ORAL_TABLET | Freq: Two times a day (BID) | ORAL | 6 refills | Status: DC

## 2016-06-18 NOTE — Addendum Note (Signed)
Encounter addended by: Kennieth Rad, RN on: 06/18/2016 10:28 AM<BR>    Actions taken: Sign clinical note

## 2016-06-18 NOTE — Progress Notes (Signed)
ADVANCED HF CLINIC NOTE   HPI:  Shane Kim is a 71 y/o male with history of hyperlipidemia and prostate cancer who presented to [facility] in 10/17 with HF.  An echo showed severe LV dysfunction with an EF <20%, moderate MR, a dilated LV, mild RV systolic dysfunction, PASP elevated at 52 mm Hg. Cardiac cath showed severe 3-vessel coronary artery disease with 90% proximal LAD, occlusion of a large OM with collaterals filling the distal vessel and an occluded proximal RCA with collaterals filling the PDA. LVEF was 20% with elevated pulmonary artery pressures and a PCWP of 29. He was transferred to [facility] for further care.   Underwent CABG 11/29/15 with Dr. Cyndia Bent LIMA to LAD, Sequential SVG to second diagonal and OM, SVG to RCA.  Post op did well. Had persistent sinus tach so ivabradine added to standard HF regimen with good result.   He presents today for HF follow up. Weight up 15 pounds since last visit 3 months ago. Not weighing himself at home. Says he has been eating more, also says that he has been eating at night. Walks 1-2 miles a day, sometimes up to 4 miles a day.  Denies SOB with activity. Denies orthopnea and PND. Eating out occasionally. Cooks low sodium foods at home. Drinking more than 2L a day. Denies [place] and smoking. Does not look volume overloaded on exam - REDs vest 27%.   Echo 02/2016 EF 20-25%   Past Medical History:  Diagnosis Date  . Acute systolic CHF (congestive heart failure) ([place])    a. echo 11/21/15: EF <20%, mildly dilated LV, severe diffuse global HK, GR3DD, mod MR, LA mildly dilated, RV sys fxn mildly reduced, PASP 52 mmHg  . CAD (coronary artery disease)    a. cardiac cath 11/22/15: dLM 40%, pLAD 80-90%, mLAD sequential 40%, D2 90%, mLCx 90% at takeoff of OM1, RCA appeared occluded proximally, LVEF 20%, PCWP 29   . Chronic diastolic CHF (congestive heart failure) ([place])   . HLD (hyperlipidemia)   . Prostate cancer [facility])     Current Outpatient Prescriptions    Medication Sig Dispense Refill  . atorvastatin (LIPITOR) 80 MG tablet Take 1 tablet (80 mg total) by mouth daily at 6 PM. 30 tablet 11  . carvedilol (COREG) 6.25 MG tablet Take 1 tablet (6.25 mg total) by mouth 2 (two) times daily. 60 tablet 6  . finasteride (PROSCAR) 5 MG tablet take 1 tablet by mouth once daily 30 tablet 3  . losartan (COZAAR) 25 MG tablet Take 1 tablet (25 mg total) by mouth daily. 30 tablet 6  . RA ASPIRIN EC ADULT LOW ST 81 MG EC tablet take 1 tablet by mouth once daily 30 tablet 3  . spironolactone (ALDACTONE) 25 MG tablet take 1 tablet by mouth once daily 30 tablet 3   No current facility-administered medications for this encounter.     No Known Allergies    Social History   Social History  . Marital status: Married    Spouse name: N/A  . Number of children: N/A  . Years of education: N/A   Occupational History  . Not on file.   Social History Main Topics  . Smoking status: Never Smoker  . Smokeless tobacco: Never Used  . Alcohol use No  . Drug use: No  . Sexual activity: Not on file   Other Topics Concern  . Not on file   Social History Narrative   Lives at home with his wife. Independent at  baseline      Family History  Problem Relation Age of Onset  . Hypertension Mother   . Healthy Father     Vitals:   06/18/16 0942  BP: 120/72  Pulse: 76  SpO2: 97%  Weight: 227 lb 6.4 oz (103.1 kg)   Wt Readings from Last 3 Encounters:  06/18/16 227 lb 6.4 oz (103.1 kg)  03/20/16 212 lb 8 oz (96.4 kg)  01/16/16 219 lb 8 oz (99.6 kg)    Physical Exam: General: Elderly male, NAD. Walked into clinic without difficulty.  HEENT: Normal  Neck: Supple. No JVP. Carotids 2+ bilat; no bruits. No thyromegaly or nodule noted.  Cor: Sternum stable. Regular rate and rhythm. No murmurs, rubs or gallops.  Lungs: CTAB, normal effort Abdomen:Soft, nontender, ND, no HSM. No bruits or masses. +BS  Extremities: no cyanosis, clubbing, rash. No lower extremity  edema.  Neuro: alert & orientedx3, cranial nerves grossly intact. moves all 4 extremities w/o difficulty. Affect pleasant  ASSESSMENT & PLAN: 1. CAD - S/P CABG x4 on 11/29/15 - Chest pain free.  - Remains active walking daily. Sometimes up to 4 miles a day.  - Continue ASA and high intensity statin.  - Refer to cardiac rehab.  2. Chronic systolic HF due to iCM EF 20% . Echo 02/2016 with EF 20-25% - NYHA II - Volume status stable.  - REDs vest is 27% - Increase Coreg to 9.375mg  BID.  - Continue losartan 25mg  daily.  - Continue Spiro 25mg  daily.  - Check BMET today. Last K was 5.0. If K is high, will stop Arlyce Harman.  - Will order Echo. If EF remains low will refer to EP for consideration of ICD.  3. Hyperlipidemia - Continue atorvastatin.  - LDL 116 4. CKD stage II - Creatinine 1.28.  - Stable.   BMET today. Will schedule Echo, follow up in 3 months.    Arbutus Leas, MD 9:46 AM

## 2016-06-18 NOTE — Patient Instructions (Addendum)
Labs today (will call for abnormal results, otherwise no news is good news)  Referral placed for Cardiac Rehab, they will contact you to schedule your first Appointment.  Echo has been ordered for you.  INCREASE Coreg to 9.375 mg (1.5 Tablets) Two times Daily  Follow up in 3 Months

## 2016-06-23 ENCOUNTER — Telehealth (HOSPITAL_COMMUNITY): Payer: Self-pay | Admitting: General Practice

## 2016-06-23 NOTE — Telephone Encounter (Signed)
I called patient to find out if he wanted to come to [facility] or [place] [facility]. Patient currently lives in [place]. I was unable to leave message on patient voicemail, patient voicemail full.

## 2016-06-25 ENCOUNTER — Telehealth (HOSPITAL_COMMUNITY): Payer: Self-pay | Admitting: General Practice

## 2016-06-25 NOTE — Telephone Encounter (Signed)
Patient called there primary care doctor about referral  on 06/25/16. Primary care forwarded the message to me in by inbasket. I hade called patient on 06/23/16 and patient voicemail was full and unable to leave a message. I called patient today at the number patient provided to return call [phone] and patient voicemail is full and I unable to leave message.

## 2016-06-26 ENCOUNTER — Telehealth (HOSPITAL_COMMUNITY): Payer: Self-pay | Admitting: General Practice

## 2016-06-26 ENCOUNTER — Ambulatory Visit (HOSPITAL_COMMUNITY)
Admission: RE | Admit: 2016-06-26 | Discharge: 2016-06-26 | Disposition: A | Discharge status: Home / Self Care | Payer: Medicare PPO | Source: Ambulatory Visit | Attending: Internal Medicine | Admitting: Internal Medicine

## 2016-06-26 DIAGNOSIS — I340 Nonrheumatic mitral (valve) insufficiency: Secondary | ICD-10-CM | POA: Diagnosis not present

## 2016-06-26 DIAGNOSIS — I5022 Chronic systolic (congestive) heart failure: Secondary | ICD-10-CM | POA: Diagnosis not present

## 2016-06-26 DIAGNOSIS — I5030 Unspecified diastolic (congestive) heart failure: Secondary | ICD-10-CM | POA: Diagnosis not present

## 2016-06-26 DIAGNOSIS — I358 Other nonrheumatic aortic valve disorders: Secondary | ICD-10-CM | POA: Diagnosis not present

## 2016-06-26 DIAGNOSIS — I517 Cardiomegaly: Secondary | ICD-10-CM | POA: Insufficient documentation

## 2016-06-26 NOTE — Telephone Encounter (Signed)
I tried to call patient this morning and voicemail is full and unable to leave a message. Patient currently lives in [place] and we wanted to know which location he would like to go to.  I have mailed patient a letter with information about [place] and [place] [facility] and I have asked patient to contact me and I have included my contact information for patient to respond to my letter.

## 2016-06-26 NOTE — Progress Notes (Signed)
  Echocardiogram 2D Echocardiogram has been performed.  Erdine Hulen L Androw 06/26/2016, 11:03 AM

## 2016-06-30 ENCOUNTER — Encounter (HOSPITAL_COMMUNITY): Payer: Self-pay | Admitting: *Deleted

## 2016-06-30 ENCOUNTER — Telehealth (HOSPITAL_COMMUNITY): Payer: Self-pay

## 2016-06-30 NOTE — Progress Notes (Signed)
Received forms from [employer] for short term disability.  Completed form signed and faxed along with office notes today to [phone].  Original form will be scanned to patient's electronic medical record.

## 2016-06-30 NOTE — Telephone Encounter (Signed)
ECHOCARDIOGRAM COMPLETE  Order: [id number]  Status:  Final result Visible to patient:  No (Not Released) Dx:  Chronic systolic congestive heart fai...  Notes recorded by Effie Berkshire, RN on 06/30/2016 at 4:44 PM EDT Attempted to call both lines, no answer. Left VM on cell phone, mailbox full on house phone. Will try again tomorrow. ------  Notes recorded by Arbutus Leas, NP on 06/30/2016 at 10:08 AM EDT Please tell Mr. Wohl that his heart function has improved. He will need to keep his follow up as scheduled.

## 2016-07-04 ENCOUNTER — Other Ambulatory Visit (HOSPITAL_COMMUNITY): Payer: Self-pay | Admitting: Internal Medicine

## 2016-07-09 ENCOUNTER — Other Ambulatory Visit (HOSPITAL_COMMUNITY): Payer: Self-pay | Admitting: *Deleted

## 2016-07-09 MED ORDER — CARVEDILOL 6.25 MG PO TABS: 9.3750 mg | ORAL_TABLET | Freq: Two times a day (BID) | ORAL | 6 refills | Status: DC

## 2016-07-14 ENCOUNTER — Ambulatory Visit (HOSPITAL_COMMUNITY): Payer: Medicare PPO

## 2016-07-16 ENCOUNTER — Ambulatory Visit (HOSPITAL_COMMUNITY): Payer: Medicare PPO

## 2016-07-18 ENCOUNTER — Ambulatory Visit (HOSPITAL_COMMUNITY): Payer: Medicare PPO

## 2016-07-23 ENCOUNTER — Ambulatory Visit (HOSPITAL_COMMUNITY): Payer: Medicare PPO

## 2016-07-25 ENCOUNTER — Ambulatory Visit (HOSPITAL_COMMUNITY): Payer: Medicare PPO

## 2016-07-28 ENCOUNTER — Ambulatory Visit (HOSPITAL_COMMUNITY): Payer: Medicare PPO

## 2016-07-30 ENCOUNTER — Ambulatory Visit (HOSPITAL_COMMUNITY): Payer: Medicare PPO

## 2016-08-01 ENCOUNTER — Ambulatory Visit (HOSPITAL_COMMUNITY): Payer: Medicare PPO

## 2016-08-04 ENCOUNTER — Ambulatory Visit (HOSPITAL_COMMUNITY): Payer: Medicare PPO

## 2016-08-06 ENCOUNTER — Ambulatory Visit (HOSPITAL_COMMUNITY): Payer: Medicare PPO

## 2016-08-08 ENCOUNTER — Ambulatory Visit (HOSPITAL_COMMUNITY): Payer: Medicare PPO

## 2016-08-11 ENCOUNTER — Ambulatory Visit: Payer: Medicare PPO | Admitting: Primary Care

## 2016-08-11 ENCOUNTER — Ambulatory Visit (HOSPITAL_COMMUNITY): Payer: Medicare PPO

## 2016-08-13 ENCOUNTER — Ambulatory Visit: Payer: Medicare PPO | Admitting: Primary Care

## 2016-08-13 ENCOUNTER — Ambulatory Visit (HOSPITAL_COMMUNITY): Payer: Medicare PPO

## 2016-08-14 ENCOUNTER — Encounter: Payer: Self-pay | Admitting: Primary Care

## 2016-08-14 ENCOUNTER — Ambulatory Visit (INDEPENDENT_AMBULATORY_CARE_PROVIDER_SITE_OTHER): Payer: Medicare PPO | Admitting: Primary Care

## 2016-08-14 VITALS — BP 118/78 | HR 66 | Temp 98.5°F | Ht 72.0 in | Wt 215.8 lb

## 2016-08-14 DIAGNOSIS — R7303 Prediabetes: Secondary | ICD-10-CM | POA: Diagnosis not present

## 2016-08-14 DIAGNOSIS — E785 Hyperlipidemia, unspecified: Secondary | ICD-10-CM | POA: Diagnosis not present

## 2016-08-14 DIAGNOSIS — I5022 Chronic systolic (congestive) heart failure: Secondary | ICD-10-CM

## 2016-08-14 DIAGNOSIS — I2510 Atherosclerotic heart disease of native coronary artery without angina pectoris: Secondary | ICD-10-CM

## 2016-08-14 DIAGNOSIS — Z8546 Personal history of malignant neoplasm of prostate: Secondary | ICD-10-CM

## 2016-08-14 DIAGNOSIS — C61 Malignant neoplasm of prostate: Secondary | ICD-10-CM

## 2016-08-14 LAB — HEPATIC FUNCTION PANEL
ALBUMIN: 4.4 g/dL (ref 3.5–5.2)
ALT: 8 U/L (ref 0–53)
AST: 15 U/L (ref 0–37)
Alkaline Phosphatase: 72 U/L (ref 39–117)
BILIRUBIN TOTAL: 0.9 mg/dL (ref 0.2–1.2)
Bilirubin, Direct: 0.2 mg/dL (ref 0.0–0.3)
Total Protein: 7.3 g/dL (ref 6.0–8.3)

## 2016-08-14 LAB — PSA: PSA: 1.73 ng/mL (ref 0.10–4.00)

## 2016-08-14 LAB — LIPID PANEL
CHOL/HDL RATIO: 4
CHOLESTEROL: 203 mg/dL — AB (ref 0–200)
HDL: 49.3 mg/dL (ref >39.00)
LDL CALC: 133 mg/dL — AB (ref 0–99)
NONHDL: 153.22
Triglycerides: 101 mg/dL (ref 0.0–149.0)
VLDL: 20.2 mg/dL (ref 0.0–40.0)

## 2016-08-14 LAB — HEMOGLOBIN A1C: Hgb A1c MFr Bld: 6.5 % (ref 4.6–6.5)

## 2016-08-14 NOTE — Assessment & Plan Note (Signed)
Not currently followed, no recent PSA on file. Check PSA today. Continue Proscar.

## 2016-08-14 NOTE — Assessment & Plan Note (Signed)
Needs repeat lipid panel today. Check LFT's since initiation of statin. No myalgias.

## 2016-08-14 NOTE — Assessment & Plan Note (Signed)
Following with CHF clinic. Commended him on his weight management, discussed to call if rapid weight gain >2 pounds in 24 hours, 5 pounds in 1 week. He verbalized understanding. Continue beta blocker, ACE, sprinolactone. Exam today unremarkable.

## 2016-08-14 NOTE — Patient Instructions (Signed)
Complete lab work prior to leaving today. I will notify you of your results once received.   Follow up in 6 months for a Medicare Wellness Visit with our nurse Katha Cabal. I will see you after this visit.  It was a pleasure to meet you today! Please don't hesitate to call me with any questions. Welcome to [facility]!

## 2016-08-14 NOTE — Assessment & Plan Note (Signed)
Following with cardiology, managed on beta blocker, ACE, atorvastatin. Asymptomatic since hospital stay last Fall. Exam unremarkable today.

## 2016-08-14 NOTE — Progress Notes (Signed)
Subjective:    Patient ID: Shane Kim, male    DOB: 1945/11/18, 71 y.o.   MRN: [id number]  HPI  Shane Kim is a 71 year old male who presents today to establish care and discuss the problems mentioned below. Will obtain old records. Looks like he established PCP care through [facility] in November 2017, he doesn't remember who he saw or that it was for a PCP establishment.   1) Hyperlipidemia/CAD: Diagnosed in September 2017. Currently managed on atorvastatin 80 mg. Last lipid panel with LDL above goal at 116 in September 2017.  2) CHF/Essential Hypertension: Currently managed on carvedilol 6.25 mg BID, spironolactone 25 mg, and losartan 25 mg. Underwent CABD in October 2017 for 3 vessel disease with 90% proximal LAD occulusion. Echocardiogram at that time with EF of <20%. Repeat Echocardiogram in January 2018 with EF 20-25%, and then increased to 35-40% in May 2018. Currently following with CHF clinic and his next appointment is in July 2018.  He denies chest pain, shortness of breath, lower extremity edema. He's weighing himself everyday and denies rapid weight gain of >2 pounds in 24 hours, 5 pounds in one week. He's been watching his weight closely.   Wt Readings from Last 3 Encounters:  08/14/16 215 lb 12.8 oz (97.9 kg)  06/18/16 227 lb 6.4 oz (103.1 kg)  03/20/16 212 lb 8 oz (96.4 kg)     3) Prostate Cancer: Diagnosed in 2005, underwent treatment at that time. Currently managed on finasteride 5 mg for which he's taken since surgery. He's not sure why he's taking this but "just always has". No recent PSA on file and currently not following with anyone. Last PSA from [facility] was 4.2 in January 2016.  4) Prediabetes: A1C of 5.9 on labs in September 2017.  Review of Systems  Constitutional: Negative for fatigue and unexpected weight change.  HENT: Negative for congestion.   Respiratory: Negative for shortness of breath.   Cardiovascular: Negative for chest pain, palpitations  and leg swelling.  Genitourinary: Negative for dysuria, frequency and urgency.  Neurological: Negative for weakness.       Past Medical History:  Diagnosis Date  . Acute systolic CHF (congestive heart failure) ([place])    a. echo 11/21/15: EF <20%, mildly dilated LV, severe diffuse global HK, GR3DD, mod MR, LA mildly dilated, RV sys fxn mildly reduced, PASP 52 mmHg  . CAD (coronary artery disease)    a. cardiac cath 11/22/15: dLM 40%, pLAD 80-90%, mLAD sequential 40%, D2 90%, mLCx 90% at takeoff of OM1, RCA appeared occluded proximally, LVEF 20%, PCWP 29   . Chickenpox   . Chronic diastolic CHF (congestive heart failure) ([place])   . HLD (hyperlipidemia)   . Prostate cancer [facility])      Social History   Social History  . Marital status: Married    Spouse name: N/A  . Number of children: N/A  . Years of education: N/A   Occupational History  . Not on file.   Social History Main Topics  . Smoking status: Never Smoker  . Smokeless tobacco: Never Used  . Alcohol use No  . Drug use: No  . Sexual activity: Not on file   Other Topics Concern  . Not on file   Social History Narrative   Married. Lives with his wife.   2 children, 2 grandchildren.   Retired. Worked as a Furniture conservator/restorer.   Enjoys fishing.     Past Surgical History:  Procedure Laterality Date  . CARDIAC  CATHETERIZATION N/A 11/22/2015   Procedure: Right/Left Heart Cath and Coronary Angiography;  Surgeon: Minna Merritts, MD;  Location: [facility];  Service: Cardiovascular;  Laterality: N/A;  . CARDIAC CATHETERIZATION Left 11/29/2015   Procedure: LEFT FEMORAL ARTERIAL LINE INSERTION;  Surgeon: Gaye Pollack, MD;  Location: [facility];  Service: Open Heart Surgery;  Laterality: Left;  . CORONARY ARTERY BYPASS GRAFT N/A 11/29/2015   Procedure: CORONARY ARTERY BYPASS GRAFTING (CABG) x four, using left internal mammary artery and right leg saphenous vein harvested endoscopically;  Surgeon: Gaye Pollack, MD;  Location: [facility];   Service: Open Heart Surgery;  Laterality: N/A;  . TEE WITHOUT CARDIOVERSION N/A 11/29/2015   Procedure: TRANSESOPHAGEAL ECHOCARDIOGRAM (TEE);  Surgeon: Gaye Pollack, MD;  Location: [place];  Service: Open Heart Surgery;  Laterality: N/A;  . TURP VAPORIZATION      Family History  Problem Relation Age of Onset  . Hypertension Mother   . Healthy Father     No Known Allergies  Current Outpatient Prescriptions on File Prior to Visit  Medication Sig Dispense Refill  . atorvastatin (LIPITOR) 80 MG tablet Take 1 tablet (80 mg total) by mouth daily at 6 PM. 30 tablet 11  . carvedilol (COREG) 6.25 MG tablet Take 1.5 tablets (9.375 mg total) by mouth 2 (two) times daily. 90 tablet 6  . finasteride (PROSCAR) 5 MG tablet take 1 tablet by mouth once daily 30 tablet 3  . RA ASPIRIN EC ADULT LOW ST 81 MG EC tablet take 1 tablet by mouth once daily 30 tablet 3  . spironolactone (ALDACTONE) 25 MG tablet take 1 tablet by mouth once daily 30 tablet 3  . losartan (COZAAR) 25 MG tablet Take 1 tablet (25 mg total) by mouth daily. 30 tablet 6   No current facility-administered medications on file prior to visit.     BP 118/78   Pulse 66   Temp 98.5 F (36.9 C) (Oral)   Ht 6' (1.829 m)   Wt 215 lb 12.8 oz (97.9 kg)   SpO2 98%   BMI 29.27 kg/m    Objective:   Physical Exam  Constitutional: He is oriented to person, place, and time. He appears well-nourished.  Neck: Neck supple.  Cardiovascular: Normal rate and regular rhythm.   Pulmonary/Chest: Effort normal and breath sounds normal. He has no wheezes. He has no rales.  Neurological: He is alert and oriented to person, place, and time.  Skin: Skin is warm and dry.  Psychiatric: He has a normal mood and affect.          Assessment & Plan:

## 2016-08-14 NOTE — Assessment & Plan Note (Signed)
Noted on labs from September 2017. Check A1C today. Discussed the importance of a healthy diet and regular exercise in order for weight loss, and to reduce the risk of other medical problems.

## 2016-08-15 ENCOUNTER — Ambulatory Visit (HOSPITAL_COMMUNITY): Payer: Medicare PPO

## 2016-08-18 ENCOUNTER — Encounter: Payer: Self-pay | Admitting: *Deleted

## 2016-08-18 ENCOUNTER — Ambulatory Visit (HOSPITAL_COMMUNITY): Payer: Medicare PPO

## 2016-08-18 ENCOUNTER — Encounter (HOSPITAL_COMMUNITY): Payer: Medicare PPO

## 2016-08-18 ENCOUNTER — Other Ambulatory Visit: Payer: Self-pay | Admitting: Primary Care

## 2016-08-18 DIAGNOSIS — E785 Hyperlipidemia, unspecified: Secondary | ICD-10-CM

## 2016-08-18 MED ORDER — ROSUVASTATIN CALCIUM 20 MG PO TABS: 20.0000 mg | ORAL_TABLET | Freq: Every day | ORAL | 1 refills | Status: DC

## 2016-08-20 ENCOUNTER — Ambulatory Visit (HOSPITAL_COMMUNITY): Payer: Medicare PPO

## 2016-08-22 ENCOUNTER — Ambulatory Visit (HOSPITAL_COMMUNITY): Payer: Medicare PPO

## 2016-08-25 ENCOUNTER — Ambulatory Visit (HOSPITAL_COMMUNITY): Payer: Medicare PPO

## 2016-08-29 ENCOUNTER — Ambulatory Visit (HOSPITAL_COMMUNITY): Payer: Medicare PPO

## 2016-09-01 ENCOUNTER — Ambulatory Visit (HOSPITAL_COMMUNITY): Payer: Medicare PPO

## 2016-09-03 ENCOUNTER — Ambulatory Visit (HOSPITAL_COMMUNITY): Payer: Medicare PPO

## 2016-09-05 ENCOUNTER — Ambulatory Visit (HOSPITAL_COMMUNITY): Payer: Medicare PPO

## 2016-09-08 ENCOUNTER — Ambulatory Visit (HOSPITAL_COMMUNITY): Payer: Medicare PPO

## 2016-09-10 ENCOUNTER — Ambulatory Visit (HOSPITAL_COMMUNITY): Payer: Medicare PPO

## 2016-09-12 ENCOUNTER — Ambulatory Visit (HOSPITAL_COMMUNITY): Payer: Medicare PPO

## 2016-09-15 ENCOUNTER — Ambulatory Visit (HOSPITAL_COMMUNITY): Payer: Medicare PPO

## 2016-09-15 ENCOUNTER — Other Ambulatory Visit (HOSPITAL_COMMUNITY): Payer: Self-pay | Admitting: Internal Medicine

## 2016-09-17 ENCOUNTER — Encounter (HOSPITAL_COMMUNITY): Payer: Medicare PPO

## 2016-09-17 ENCOUNTER — Ambulatory Visit (HOSPITAL_COMMUNITY): Payer: Medicare PPO

## 2016-09-18 ENCOUNTER — Other Ambulatory Visit (HOSPITAL_COMMUNITY): Payer: Self-pay | Admitting: *Deleted

## 2016-09-18 MED ORDER — FINASTERIDE 5 MG PO TABS: 5.0000 mg | ORAL_TABLET | Freq: Every day | ORAL | 3 refills | Status: DC

## 2016-09-18 MED ORDER — ASPIRIN 81 MG PO TBEC: 81.0000 mg | DELAYED_RELEASE_TABLET | Freq: Every day | ORAL | 3 refills | Status: DC

## 2016-09-18 MED ORDER — SPIRONOLACTONE 25 MG PO TABS: 25.0000 mg | ORAL_TABLET | Freq: Every day | ORAL | 3 refills | Status: DC

## 2016-09-19 ENCOUNTER — Ambulatory Visit (HOSPITAL_COMMUNITY): Payer: Medicare PPO

## 2016-09-22 ENCOUNTER — Ambulatory Visit (HOSPITAL_COMMUNITY): Payer: Medicare PPO

## 2016-09-23 ENCOUNTER — Ambulatory Visit (HOSPITAL_COMMUNITY)
Admission: RE | Admit: 2016-09-23 | Discharge: 2016-09-23 | Disposition: A | Discharge status: Home / Self Care | Payer: Medicare PPO | Source: Ambulatory Visit | Attending: Cardiology | Admitting: Cardiology

## 2016-09-23 VITALS — BP 120/64 | HR 74 | Wt 217.4 lb

## 2016-09-23 DIAGNOSIS — Z79899 Other long term (current) drug therapy: Secondary | ICD-10-CM | POA: Insufficient documentation

## 2016-09-23 DIAGNOSIS — Z8546 Personal history of malignant neoplasm of prostate: Secondary | ICD-10-CM | POA: Diagnosis not present

## 2016-09-23 DIAGNOSIS — N182 Chronic kidney disease, stage 2 (mild): Secondary | ICD-10-CM | POA: Insufficient documentation

## 2016-09-23 DIAGNOSIS — E785 Hyperlipidemia, unspecified: Secondary | ICD-10-CM | POA: Insufficient documentation

## 2016-09-23 DIAGNOSIS — I255 Ischemic cardiomyopathy: Secondary | ICD-10-CM | POA: Diagnosis not present

## 2016-09-23 DIAGNOSIS — Z7982 Long term (current) use of aspirin: Secondary | ICD-10-CM | POA: Diagnosis not present

## 2016-09-23 DIAGNOSIS — I5022 Chronic systolic (congestive) heart failure: Secondary | ICD-10-CM

## 2016-09-23 DIAGNOSIS — I2510 Atherosclerotic heart disease of native coronary artery without angina pectoris: Secondary | ICD-10-CM | POA: Diagnosis not present

## 2016-09-23 DIAGNOSIS — Z951 Presence of aortocoronary bypass graft: Secondary | ICD-10-CM | POA: Insufficient documentation

## 2016-09-23 DIAGNOSIS — I5042 Chronic combined systolic (congestive) and diastolic (congestive) heart failure: Secondary | ICD-10-CM | POA: Insufficient documentation

## 2016-09-23 LAB — BASIC METABOLIC PANEL
ANION GAP: 5 (ref 5–15)
BUN: 22 mg/dL — ABNORMAL HIGH (ref 6–20)
CALCIUM: 9.7 mg/dL (ref 8.9–10.3)
CO2: 26 mmol/L (ref 22–32)
CREATININE: 1.34 mg/dL — AB (ref 0.61–1.24)
Chloride: 105 mmol/L (ref 101–111)
GFR calc Af Amer: 59 mL/min — ABNORMAL LOW (ref >60)
GFR calc non Af Amer: 51 mL/min — ABNORMAL LOW (ref >60)
GLUCOSE: 93 mg/dL (ref 65–99)
Potassium: 5 mmol/L (ref 3.5–5.1)
Sodium: 136 mmol/L (ref 135–145)

## 2016-09-23 NOTE — Progress Notes (Signed)
Advanced Heart Failure Medication Review by a Pharmacist  Does the patient  feel that his/her medications are working for him/her?  yes  Has the patient been experiencing any side effects to the medications prescribed?  no  Does the patient measure his/her own blood pressure or blood glucose at home?  no   Does the patient have any problems obtaining medications due to transportation or finances?   no  Understanding of regimen: good Understanding of indications: good Potential of compliance: good Patient understands to avoid NSAIDs. Patient understands to avoid decongestants.  Issues to address at subsequent visits: None   Pharmacist comments: Mr. Buckalew is a pleasant 70 yo M presenting with his medication bottles which are all in date. He reports good compliance with his regimen and did not have any specific medication-related questions or concerns for me at this time.   Ruta Hinds. Velva Harman, PharmD, BCPS, CPP Clinical Pharmacist Pager: [phone] Phone: [phone] 09/23/2016 11:58 AM      Time with patient: 10 minutes Preparation and documentation time: 2 minutes Total time: 12 minutes

## 2016-09-23 NOTE — Patient Instructions (Signed)
Labs today We will only contact you if something comes back abnormal or we need to make some changes. Otherwise no news is good news!   Your physician recommends that you schedule a follow-up appointment in: 3 months with Dr Bensimhon  Do the following things EVERYDAY: 1) Weigh yourself in the morning before breakfast. Write it down and keep it in a log. 2) Take your medicines as prescribed 3) Eat low salt foods-Limit salt (sodium) to 2000 mg per day.  4) Stay as active as you can everyday 5) Limit all fluids for the day to less than 2 liters   

## 2016-09-23 NOTE — Progress Notes (Signed)
ADVANCED HF CLINIC NOTE   HPI:  Shane Kim is a 71 y/o male with history of hyperlipidemia and prostate cancer who presented to [facility] in 10/17 with HF.  An echo showed severe LV dysfunction with an EF <20%, moderate MR, a dilated LV, mild RV systolic dysfunction, PASP elevated at 52 mm Hg. Cardiac cath showed severe 3-vessel coronary artery disease with 90% proximal LAD, occlusion of a large OM with collaterals filling the distal vessel and an occluded proximal RCA with collaterals filling the PDA. LVEF was 20% with elevated pulmonary artery pressures and a PCWP of 29. He was transferred to [facility] for further care.   Underwent CABG 11/29/15 with Dr. Cyndia Bent LIMA to LAD, Sequential SVG to second diagonal and OM, SVG to RCA.  Post op did well. Had persistent sinus tach so ivabradine added to standard HF regimen with good result.   He presents today for HF follow up. He recently established with a PCP. Denies SOB with activity, he continues to walk about 3-4 miles a day without SOB. His weights at home are stable at 213-215 pounds. He is taking all of his medications with compliance.   Echo 02/2016 EF 20-25% Echo 06/2016 EF 35-40%.  Past Medical History:  Diagnosis Date  . Acute systolic CHF (congestive heart failure) ([place])    a. echo 11/21/15: EF <20%, mildly dilated LV, severe diffuse global HK, GR3DD, mod MR, LA mildly dilated, RV sys fxn mildly reduced, PASP 52 mmHg  . CAD (coronary artery disease)    a. cardiac cath 11/22/15: dLM 40%, pLAD 80-90%, mLAD sequential 40%, D2 90%, mLCx 90% at takeoff of OM1, RCA appeared occluded proximally, LVEF 20%, PCWP 29   . Chickenpox   . Chronic diastolic CHF (congestive heart failure) ([place])   . HLD (hyperlipidemia)   . Prostate cancer [facility])     Current Outpatient Prescriptions  Medication Sig Dispense Refill  . aspirin (RA ASPIRIN EC ADULT LOW ST) 81 MG EC tablet Take 1 tablet (81 mg total) by mouth daily. Swallow whole. 30 tablet 3  . carvedilol  (COREG) 6.25 MG tablet Take 1.5 tablets (9.375 mg total) by mouth 2 (two) times daily. 90 tablet 6  . finasteride (PROSCAR) 5 MG tablet Take 1 tablet (5 mg total) by mouth daily. 30 tablet 3  . rosuvastatin (CRESTOR) 20 MG tablet Take 1 tablet (20 mg total) by mouth at bedtime. 90 tablet 1  . spironolactone (ALDACTONE) 25 MG tablet Take 1 tablet (25 mg total) by mouth daily. 30 tablet 3  . losartan (COZAAR) 25 MG tablet Take 1 tablet (25 mg total) by mouth daily. 30 tablet 6   No current facility-administered medications for this encounter.     No Known Allergies    Social History   Social History  . Marital status: Married    Spouse name: N/A  . Number of children: N/A  . Years of education: N/A   Occupational History  . Not on file.   Social History Main Topics  . Smoking status: Never Smoker  . Smokeless tobacco: Never Used  . Alcohol use No  . Drug use: No  . Sexual activity: Not on file   Other Topics Concern  . Not on file   Social History Narrative   Married. Lives with his wife.   2 children, 2 grandchildren.   Retired. Worked as a Furniture conservator/restorer.   Enjoys fishing.       Family History  Problem Relation Age of Onset  .  Hypertension Mother   . Healthy Father     Vitals:   09/23/16 1132  BP: 120/64  Pulse: 74  SpO2: 97%  Weight: 217 lb 6.4 oz (98.6 kg)   Wt Readings from Last 3 Encounters:  09/23/16 217 lb 6.4 oz (98.6 kg)  08/14/16 215 lb 12.8 oz (97.9 kg)  06/18/16 227 lb 6.4 oz (103.1 kg)    Physical Exam: General: Elderly male, NAD. Walked into clinic without difficulty.  HEENT: Normal Neck: Supple. JVP flat. Carotids 2+ bilat; no bruits. No thyromegaly or nodule noted.  Cor: Regular rate and rhythm.  No murmurs, rubs or gallops.  Lungs: CTAB, normal effort.  Abdomen: Soft, non tender, non distended. no HSM. No bruits or masses. +BS  Extremities: no cyanosis, clubbing, rash. No lower extremity edema.  Neuro: alert & orientedx3, cranial nerves  grossly intact. moves all 4 extremities w/o difficulty. Affect pleasant  ASSESSMENT & PLAN: 1. CAD - S/P CABG x4 on 11/29/15 - No signs or symptoms of ischemia.  - Continue ASA and statin. He was recently switched to Crestor by his PCP.   2. Chronic systolic HF due to iCM EF 20% . Echo 02/2016 with EF 20-25%. EF 06/2016 EF 35-40%.  - NYHA II - Volume status stable.  - Continue Coreg 9.375 mg BID, will not increase as he says sometimes his home BP cuff says his SBP is in the 90's. BP stable today and he denies orthostatic symptoms.  - Continue losartan 25 mg daily.  - Continue Spiro 25 mf daily.  - EF now out of ICD range.   3. Hyperlipidemia - Continue Crestor.   4. CKD stage II - BMET today.   Follow up in 3 months with Dr. Peri Maris, NP-C  12:01 PM

## 2016-09-24 ENCOUNTER — Ambulatory Visit (HOSPITAL_COMMUNITY): Payer: Medicare PPO

## 2016-09-26 ENCOUNTER — Ambulatory Visit (HOSPITAL_COMMUNITY): Payer: Medicare PPO

## 2016-09-29 ENCOUNTER — Ambulatory Visit (HOSPITAL_COMMUNITY): Payer: Medicare PPO

## 2016-10-01 ENCOUNTER — Ambulatory Visit (HOSPITAL_COMMUNITY): Payer: Medicare PPO

## 2016-10-03 ENCOUNTER — Ambulatory Visit (HOSPITAL_COMMUNITY): Payer: Medicare PPO

## 2016-10-06 ENCOUNTER — Ambulatory Visit (HOSPITAL_COMMUNITY): Payer: Medicare PPO

## 2016-10-08 ENCOUNTER — Ambulatory Visit (HOSPITAL_COMMUNITY): Payer: Medicare PPO

## 2016-10-10 ENCOUNTER — Ambulatory Visit (HOSPITAL_COMMUNITY): Payer: Medicare PPO

## 2016-10-13 ENCOUNTER — Ambulatory Visit (HOSPITAL_COMMUNITY): Payer: Medicare PPO

## 2016-10-15 ENCOUNTER — Ambulatory Visit (HOSPITAL_COMMUNITY): Payer: Medicare PPO

## 2016-12-15 ENCOUNTER — Other Ambulatory Visit (INDEPENDENT_AMBULATORY_CARE_PROVIDER_SITE_OTHER): Payer: Medicare PPO

## 2016-12-15 DIAGNOSIS — E785 Hyperlipidemia, unspecified: Secondary | ICD-10-CM

## 2016-12-15 LAB — LIPID PANEL
CHOLESTEROL: 178 mg/dL (ref 0–200)
HDL: 51.4 mg/dL (ref >39.00)
LDL Cholesterol: 111 mg/dL — ABNORMAL HIGH (ref 0–99)
NonHDL: 126.48
TRIGLYCERIDES: 76 mg/dL (ref 0.0–149.0)
Total CHOL/HDL Ratio: 3
VLDL: 15.2 mg/dL (ref 0.0–40.0)

## 2016-12-15 LAB — HEMOGLOBIN A1C: Hgb A1c MFr Bld: 6 % (ref 4.6–6.5)

## 2016-12-17 ENCOUNTER — Encounter: Payer: Self-pay | Admitting: *Deleted

## 2016-12-22 ENCOUNTER — Telehealth: Payer: Self-pay | Admitting: Primary Care

## 2016-12-22 NOTE — Telephone Encounter (Signed)
Placed form for Shane Kim to review in her inbox

## 2016-12-22 NOTE — Telephone Encounter (Signed)
Looks like he needs a TB test. Also we don't have information on Tetanus vaccination, please ask him about this. Form mostly completed in [name]'s inbox, waiting on TB skin test result.

## 2016-12-22 NOTE — Telephone Encounter (Signed)
Best number [phone] Pt dropped off health examination certificate form In rx tower up front Pt stated he needs this by 12/24/16

## 2016-12-22 NOTE — Progress Notes (Signed)
Patient has not called back in to the [facility] 

## 2016-12-24 ENCOUNTER — Encounter (HOSPITAL_COMMUNITY): Payer: Self-pay | Admitting: Internal Medicine

## 2016-12-24 ENCOUNTER — Ambulatory Visit (HOSPITAL_COMMUNITY)
Admission: RE | Admit: 2016-12-24 | Discharge: 2016-12-24 | Disposition: A | Discharge status: Home / Self Care | Payer: Medicare PPO | Source: Ambulatory Visit | Attending: Internal Medicine | Admitting: Internal Medicine

## 2016-12-24 VITALS — BP 114/80 | HR 67 | Wt 215.1 lb

## 2016-12-24 DIAGNOSIS — I5022 Chronic systolic (congestive) heart failure: Secondary | ICD-10-CM | POA: Diagnosis not present

## 2016-12-24 DIAGNOSIS — Z951 Presence of aortocoronary bypass graft: Secondary | ICD-10-CM | POA: Diagnosis not present

## 2016-12-24 DIAGNOSIS — Z79899 Other long term (current) drug therapy: Secondary | ICD-10-CM | POA: Insufficient documentation

## 2016-12-24 DIAGNOSIS — E785 Hyperlipidemia, unspecified: Secondary | ICD-10-CM | POA: Diagnosis not present

## 2016-12-24 DIAGNOSIS — Z7982 Long term (current) use of aspirin: Secondary | ICD-10-CM | POA: Insufficient documentation

## 2016-12-24 DIAGNOSIS — I5042 Chronic combined systolic (congestive) and diastolic (congestive) heart failure: Secondary | ICD-10-CM | POA: Diagnosis not present

## 2016-12-24 DIAGNOSIS — Z8546 Personal history of malignant neoplasm of prostate: Secondary | ICD-10-CM | POA: Insufficient documentation

## 2016-12-24 DIAGNOSIS — I2510 Atherosclerotic heart disease of native coronary artery without angina pectoris: Secondary | ICD-10-CM | POA: Diagnosis not present

## 2016-12-24 DIAGNOSIS — N182 Chronic kidney disease, stage 2 (mild): Secondary | ICD-10-CM | POA: Diagnosis not present

## 2016-12-24 LAB — BASIC METABOLIC PANEL
Anion gap: 7 (ref 5–15)
BUN: 12 mg/dL (ref 6–20)
CALCIUM: 10 mg/dL (ref 8.9–10.3)
CHLORIDE: 104 mmol/L (ref 101–111)
CO2: 28 mmol/L (ref 22–32)
CREATININE: 1.15 mg/dL (ref 0.61–1.24)
GFR calc Af Amer: >60 mL/min (ref >60)
GFR calc non Af Amer: >60 mL/min (ref >60)
GLUCOSE: 98 mg/dL (ref 65–99)
Potassium: 4.2 mmol/L (ref 3.5–5.1)
Sodium: 139 mmol/L (ref 135–145)

## 2016-12-24 NOTE — Telephone Encounter (Signed)
Spoken and notified patient's spouse  of Kate's comments. Patient's spouse verbalized understanding.  She stated that she will call and schedule a nurse visit for patient to get this done.   Went ahead and faxed what was completed to [facility] at [phone].   Will hold form for patient.

## 2016-12-24 NOTE — Patient Instructions (Signed)
Labs today  We will contact you in 6 months to schedule your next appointment and echocardiogram  

## 2016-12-24 NOTE — Addendum Note (Signed)
Encounter addended by: Scarlette Calico, RN on: 12/24/2016 10:08 AM<BR>    Actions taken: Sign clinical note, Order list changed, Diagnosis association updated

## 2016-12-24 NOTE — Progress Notes (Signed)
ADVANCED HF CLINIC NOTE   HPI:  [name] is a 71 y/o male with history of hyperlipidemia and prostate cancer who presented to [facility] in 10/17 with HF.  An echo showed severe LV dysfunction with an EF <20%, moderate MR, a dilated LV, mild RV systolic dysfunction, PASP elevated at 52 mm Hg. Cardiac cath showed severe 3-vessel coronary artery disease with 90% proximal LAD, occlusion of a large OM with collaterals filling the distal vessel and an occluded proximal RCA with collaterals filling the PDA. LVEF was 20% with elevated pulmonary artery pressures and a PCWP of 29. He was transferred to [facility] for further care.   Underwent CABG 11/29/15 with Dr. Cyndia Bent LIMA to LAD, Sequential SVG to second diagonal and OM, SVG to RCA.  Post op did well. Had persistent sinus tach so ivabradine added to standard HF regimen with good result.   He presents today for HF follow up. Feels great. No SOB. Working as a Investment banker, operational. No CP. No edema. Orthopnea or PND. Occasional dizziness.   Echo 02/2016 EF 20-25% Echo 06/2016 EF 35-40%.  Past Medical History:  Diagnosis Date  . Acute systolic CHF (congestive heart failure) ([place])    a. echo 11/21/15: EF <20%, mildly dilated LV, severe diffuse global HK, GR3DD, mod MR, LA mildly dilated, RV sys fxn mildly reduced, PASP 52 mmHg  . CAD (coronary artery disease)    a. cardiac cath 11/22/15: dLM 40%, pLAD 80-90%, mLAD sequential 40%, D2 90%, mLCx 90% at takeoff of OM1, RCA appeared occluded proximally, LVEF 20%, PCWP 29   . Chickenpox   . Chronic diastolic CHF (congestive heart failure) ([place])   . HLD (hyperlipidemia)   . Prostate cancer [facility])     Current Outpatient Prescriptions  Medication Sig Dispense Refill  . aspirin (RA ASPIRIN EC ADULT LOW ST) 81 MG EC tablet Take 1 tablet (81 mg total) by mouth daily. Swallow whole. 30 tablet 3  . carvedilol (COREG) 6.25 MG tablet Take 1.5 tablets (9.375 mg total) by mouth 2 (two) times daily. 90 tablet 6  . finasteride  (PROSCAR) 5 MG tablet Take 1 tablet (5 mg total) by mouth daily. 30 tablet 3  . losartan (COZAAR) 25 MG tablet Take 1 tablet (25 mg total) by mouth daily. 30 tablet 6  . rosuvastatin (CRESTOR) 20 MG tablet Take 1 tablet (20 mg total) by mouth at bedtime. 90 tablet 1  . spironolactone (ALDACTONE) 25 MG tablet Take 1 tablet (25 mg total) by mouth daily. 30 tablet 3   No current facility-administered medications for this encounter.     No Known Allergies    Social History   Social History  . Marital status: Married    Spouse name: N/A  . Number of children: N/A  . Years of education: N/A   Occupational History  . Not on file.   Social History Main Topics  . Smoking status: Never Smoker  . Smokeless tobacco: Never Used  . Alcohol use No  . Drug use: No  . Sexual activity: Not on file   Other Topics Concern  . Not on file   Social History Narrative   Married. Lives with his wife.   2 children, 2 grandchildren.   Retired. Worked as a Furniture conservator/restorer.   Enjoys fishing.       Family History  Problem Relation Age of Onset  . Hypertension Mother   . Healthy Father     Vitals:   12/24/16 0906  BP: 114/80  Pulse: 67  SpO2: 95%  Weight: 215 lb 1.9 oz (97.6 kg)   Wt Readings from Last 3 Encounters:  12/24/16 215 lb 1.9 oz (97.6 kg)  09/23/16 217 lb 6.4 oz (98.6 kg)  08/14/16 215 lb 12.8 oz (97.9 kg)    Physical Exam: General:  Well appearing. No resp difficulty HEENT: normal Neck: supple. no JVD. Carotids 2+ bilat; no bruits. No lymphadenopathy or thryomegaly appreciated. Cor: PMI nondisplaced. Regular rate & rhythm. No rubs, gallops or murmurs. Lungs: clear Abdomen: soft, nontender, nondistended. No hepatosplenomegaly. No bruits or masses. Good bowel sounds. Extremities: no cyanosis, clubbing, rash, edema Neuro: alert & orientedx3, cranial nerves grossly intact. moves all 4 extremities w/o difficulty. Affect pleasant   ASSESSMENT & PLAN: 1. CAD - S/P CABG x4 on  11/29/15 - Doing very well. No s/s ischemia  - Continue ASA and statin. Followed by PCP  2. Chronic systolic HF due to iCM EF 20% . Echo 02/2016 with EF 20-25%. EF 06/2016 EF 35-40%.  - NYHA I. Volume status looks good. EF improving - On good mes. Would not titrate with low BP and occasional dizziness. - EF now out of ICD range.   3. Hyperlipidemia - Continue Crestor. Followed by PCP  4. CKD stage II - BMET today. Potassium was high in July. Needs to be rechecked on spiro   F/u 6 months with echo.   Glori Bickers, MD 9:49 AM

## 2017-01-01 ENCOUNTER — Ambulatory Visit: Payer: Medicare PPO

## 2017-01-19 ENCOUNTER — Other Ambulatory Visit (HOSPITAL_COMMUNITY): Payer: Self-pay | Admitting: *Deleted

## 2017-01-19 MED ORDER — LOSARTAN POTASSIUM 25 MG PO TABS: 25.0000 mg | ORAL_TABLET | Freq: Every day | ORAL | 6 refills | Status: DC

## 2017-01-20 ENCOUNTER — Other Ambulatory Visit (HOSPITAL_COMMUNITY): Payer: Self-pay | Admitting: *Deleted

## 2017-01-20 MED ORDER — FINASTERIDE 5 MG PO TABS: 5.0000 mg | ORAL_TABLET | Freq: Every day | ORAL | 3 refills | Status: DC

## 2017-01-20 MED ORDER — SPIRONOLACTONE 25 MG PO TABS: 25.0000 mg | ORAL_TABLET | Freq: Every day | ORAL | 3 refills | Status: DC

## 2017-01-21 ENCOUNTER — Ambulatory Visit (INDEPENDENT_AMBULATORY_CARE_PROVIDER_SITE_OTHER): Payer: Medicare PPO

## 2017-01-21 DIAGNOSIS — Z111 Encounter for screening for respiratory tuberculosis: Secondary | ICD-10-CM | POA: Diagnosis not present

## 2017-01-23 LAB — TB SKIN TEST: TB Skin Test: NEGATIVE

## 2017-02-05 ENCOUNTER — Ambulatory Visit (INDEPENDENT_AMBULATORY_CARE_PROVIDER_SITE_OTHER): Payer: Medicare PPO

## 2017-02-05 ENCOUNTER — Ambulatory Visit: Payer: Medicare PPO

## 2017-02-05 VITALS — BP 106/70 | HR 65 | Temp 97.7°F | Ht 71.5 in | Wt 225.5 lb

## 2017-02-05 DIAGNOSIS — Z0000 Encounter for general adult medical examination without abnormal findings: Secondary | ICD-10-CM

## 2017-02-05 DIAGNOSIS — R710 Precipitous drop in hematocrit: Secondary | ICD-10-CM | POA: Diagnosis not present

## 2017-02-05 DIAGNOSIS — Z1159 Encounter for screening for other viral diseases: Secondary | ICD-10-CM | POA: Diagnosis not present

## 2017-02-05 DIAGNOSIS — Z9189 Other specified personal risk factors, not elsewhere classified: Secondary | ICD-10-CM

## 2017-02-05 LAB — CBC WITH DIFFERENTIAL/PLATELET
Basophils Absolute: 0 10*3/uL (ref 0.0–0.1)
Basophils Relative: 0.8 % (ref 0.0–3.0)
Eosinophils Absolute: 0.3 10*3/uL (ref 0.0–0.7)
Eosinophils Relative: 12.3 % — ABNORMAL HIGH (ref 0.0–5.0)
HCT: 32.9 % — ABNORMAL LOW (ref 39.0–52.0)
Hemoglobin: 10.8 g/dL — ABNORMAL LOW (ref 13.0–17.0)
LYMPHS ABS: 0.8 10*3/uL (ref 0.7–4.0)
Lymphocytes Relative: 35.5 % (ref 12.0–46.0)
MCHC: 32.9 g/dL (ref 30.0–36.0)
MCV: 98.5 fl (ref 78.0–100.0)
MONO ABS: 0.2 10*3/uL (ref 0.1–1.0)
Monocytes Relative: 6.8 % (ref 3.0–12.0)
NEUTROS PCT: 44.6 % (ref 43.0–77.0)
Neutro Abs: 1 10*3/uL — ABNORMAL LOW (ref 1.4–7.7)
PLATELETS: 192 10*3/uL (ref 150.0–400.0)
RBC: 3.33 Mil/uL — AB (ref 4.22–5.81)
RDW: 13.5 % (ref 11.5–15.5)
WBC: 2.3 10*3/uL — ABNORMAL LOW (ref 4.0–10.5)

## 2017-02-05 NOTE — Progress Notes (Signed)
PCP notes:   Health maintenance:  Colonoscopy - PCP please address at next appt PPSV23 - addressed; pt plans to get vaccine at pharmacy Tetanus vaccine - postponed/insurance Hep C screening - completed  Abnormal screenings:   Hearing-failed  Hearing Screening   125Hz  250Hz  500Hz  1000Hz  2000Hz  3000Hz  4000Hz  6000Hz  8000Hz   Right ear:   40 40 40  40    Left ear:   0 0 40  0     Patient concerns:   None  Nurse concerns:  None  Next PCP appt:   02/12/17 @ 0930

## 2017-02-05 NOTE — Progress Notes (Signed)
Pre visit review using our clinic review tool, if applicable. No additional management support is needed unless otherwise documented below in the visit note. 

## 2017-02-05 NOTE — Progress Notes (Signed)
Subjective:   Shane Kim is a 71 y.o. male who presents for an Initial Medicare Annual Wellness Visit.  Review of Systems  N/A Cardiac Risk Factors include: advanced age (>61men, >72 women);male gender;obesity (BMI >30kg/m2);dyslipidemia    Objective:    Today's Vitals   02/05/17 1123  BP: 106/70  Pulse: 65  Temp: 97.7 F (36.5 C)  TempSrc: Oral  SpO2: 97%  Weight: 225 lb 8 oz (102.3 kg)  Height: 5' 11.5" (1.816 m)  PainSc: 0-No pain   Body mass index is 31.01 kg/m.  Advanced Directives 02/05/2017 11/22/2015 11/21/2015  Does Patient Have a Medical Advance Directive? No No No  Would patient like information on creating a medical advance directive? No - Patient declined No - patient declined information No - patient declined information    Current Medications (verified) Outpatient Encounter Medications as of 02/05/2017  Medication Sig  . aspirin (RA ASPIRIN EC ADULT LOW ST) 81 MG EC tablet Take 1 tablet (81 mg total) by mouth daily. Swallow whole.  . carvedilol (COREG) 6.25 MG tablet Take 1.5 tablets (9.375 mg total) by mouth 2 (two) times daily.  . finasteride (PROSCAR) 5 MG tablet Take 1 tablet (5 mg total) by mouth daily.  Marland Kitchen losartan (COZAAR) 25 MG tablet Take 1 tablet (25 mg total) by mouth daily.  . rosuvastatin (CRESTOR) 20 MG tablet Take 1 tablet (20 mg total) by mouth at bedtime.  Marland Kitchen spironolactone (ALDACTONE) 25 MG tablet Take 1 tablet (25 mg total) by mouth daily.   No facility-administered encounter medications on file as of 02/05/2017.     Allergies (verified) Patient has no known allergies.   History: Past Medical History:  Diagnosis Date  . Acute systolic CHF (congestive heart failure) ([place])    a. echo 11/21/15: EF <20%, mildly dilated LV, severe diffuse global HK, GR3DD, mod MR, LA mildly dilated, RV sys fxn mildly reduced, PASP 52 mmHg  . CAD (coronary artery disease)    a. cardiac cath 11/22/15: dLM 40%, pLAD 80-90%, mLAD sequential 40%, D2 90%,  mLCx 90% at takeoff of OM1, RCA appeared occluded proximally, LVEF 20%, PCWP 29   . Chickenpox   . Chronic diastolic CHF (congestive heart failure) ([place])   . HLD (hyperlipidemia)   . Prostate cancer [facility])    Past Surgical History:  Procedure Laterality Date  . CARDIAC CATHETERIZATION N/A 11/22/2015   Procedure: Right/Left Heart Cath and Coronary Angiography;  Surgeon: Minna Merritts, MD;  Location: [place] [facility];  Service: Cardiovascular;  Laterality: N/A;  . CARDIAC CATHETERIZATION Left 11/29/2015   Procedure: LEFT FEMORAL ARTERIAL LINE INSERTION;  Surgeon: Gaye Pollack, MD;  Location: [place];  Service: Open Heart Surgery;  Laterality: Left;  . CORONARY ARTERY BYPASS GRAFT N/A 11/29/2015   Procedure: CORONARY ARTERY BYPASS GRAFTING (CABG) x four, using left internal mammary artery and right leg saphenous vein harvested endoscopically;  Surgeon: Gaye Pollack, MD;  Location: [place] OR;  Service: Open Heart Surgery;  Laterality: N/A;  . TEE WITHOUT CARDIOVERSION N/A 11/29/2015   Procedure: TRANSESOPHAGEAL ECHOCARDIOGRAM (TEE);  Surgeon: Gaye Pollack, MD;  Location: [place];  Service: Open Heart Surgery;  Laterality: N/A;  . TURP VAPORIZATION     Family History  Problem Relation Age of Onset  . Hypertension Mother   . Healthy Father    Social History   Socioeconomic History  . Marital status: Married    Spouse name: None  . Number of children: None  . Years of  education: None  . Highest education level: None  Social Needs  . Financial resource strain: None  . Food insecurity - worry: None  . Food insecurity - inability: None  . Transportation needs - medical: None  . Transportation needs - non-medical: None  Occupational History  . None  Tobacco Use  . Smoking status: Never Smoker  . Smokeless tobacco: Never Used  Substance and Sexual Activity  . Alcohol use: No  . Drug use: No  . Sexual activity: None  Other Topics Concern  . None  Social History Narrative    Married. Lives with his wife.   2 children, 2 grandchildren.   Retired. Worked as a Furniture conservator/restorer.   Enjoys fishing.    Tobacco Counseling Counseling given: No   Clinical Intake:  Pre-visit preparation completed: Yes  Pain : No/denies pain Pain Score: 0-No pain     Nutritional Status: BMI 25 -29 Overweight Nutritional Risks: None Diabetes: No  How often do you need to have someone help you when you read instructions, pamphlets, or other written materials from your doctor or pharmacy?: 1 - Never What is the last grade level you completed in school?: 12th grade  Interpreter Needed?: No  Comments: pt lives with spouse Information entered by :: LPinson, LPN  Activities of Daily Living In your present state of health, do you have any difficulty performing the following activities: 02/05/2017  Hearing? N  Vision? N  Difficulty concentrating or making decisions? N  Walking or climbing stairs? N  Dressing or bathing? N  Doing errands, shopping? N  Preparing Food and eating ? N  Using the Toilet? N  In the past six months, have you accidently leaked urine? N  Do you have problems with loss of bowel control? N  Managing your Medications? N  Managing your Finances? N  Housekeeping or managing your Housekeeping? N  Some recent data might be hidden     Immunizations and Health Maintenance Immunization History  Administered Date(s) Administered  . Influenza, High Dose Seasonal PF 10/29/2015  . Influenza-Unspecified 10/24/2016  . PPD Test 01/21/2017  . Pneumococcal Conjugate-13 10/29/2015   There are no preventive care reminders to display for this patient.  Patient Care Team: Pleas Koch, NP as PCP - General (Internal Medicine)  Indicate any recent Medical Services you may have received from other than [facility] providers in the past year (date may be approximate).    Assessment:   This is a routine wellness examination for Shane Kim.  Hearing/Vision screen  Hearing  Screening   125Hz  250Hz  500Hz  1000Hz  2000Hz  3000Hz  4000Hz  6000Hz  8000Hz   Right ear:   40 40 40  40    Left ear:   0 0 40  0      Visual Acuity Screening   Right eye Left eye Both eyes  Without correction: 20/40 20/30 20/30   With correction:       Dietary issues and exercise activities discussed: Current Exercise Habits: Home exercise routine, Type of exercise: walking, Time (Minutes): 20, Frequency (Times/Week): 3, Weekly Exercise (Minutes/Week): 60, Intensity: Mild, Exercise limited by: None identified  Goals    . DIET - INCREASE WATER INTAKE     Starting 02/05/2017, I will continue to drink at least 8 glasses of water daily.       Depression Screen PHQ 2/9 Scores 02/05/2017  PHQ - 2 Score 0  PHQ- 9 Score 0    Fall Risk Fall Risk  02/05/2017  Falls in the past year? No  Cognitive Function: MMSE - Mini Mental State Exam 02/05/2017  Orientation to time 5  Orientation to Place 5  Registration 3  Attention/ Calculation 0  Recall 3  Language- name 2 objects 0  Language- repeat 1  Language- follow 3 step command 3  Language- read & follow direction 0  Write a sentence 0  Copy design 0  Total score 20       PLEASE NOTE: A Mini-Cog screen was completed. Maximum score is 20. A value of 0 denotes this part of [place] MMSE was not completed or the patient failed this part of the Mini-Cog screening.   Mini-Cog Screening Orientation to Time - Max 5 pts Orientation to Place - Max 5 pts Registration - Max 3 pts Recall - Max 3 pts Language Repeat - Max 1 pts Language Follow 3 Step Command - Max 3 pts   Screening Tests Health Maintenance  Topic Date Due  . PNA vac Low Risk Adult (2 of 2 - PPSV23) 05/24/2017 (Originally 10/28/2016)  . TETANUS/TDAP  02/05/2018 (Originally [birth year]/1962)  . COLONOSCOPY  02/23/2018 (Originally 07/04/1993)  . INFLUENZA VACCINE  Completed  . Hepatitis C Screening  Completed      Plan:     I have personally reviewed, addressed, and noted the  following in the patient's chart:  A. Medical and social history B. Use of alcohol, tobacco or illicit drugs  C. Current medications and supplements D. Functional ability and status E.  Nutritional status F.  Physical activity G. Advance directives H. List of other physicians I.  Hospitalizations, surgeries, and ER visits in previous 12 months J.  [place] to include hearing, vision, cognitive, depression L. Referrals and appointments - none  In addition, I have reviewed and discussed with patient certain preventive protocols, quality metrics, and best practice recommendations. A written personalized care plan for preventive services as well as general preventive health recommendations were provided to patient.  See attached scanned questionnaire for additional information.   Signed,   Lindell Noe, MHA, BS, LPN Health Coach

## 2017-02-05 NOTE — Patient Instructions (Addendum)
Shane Kim , Thank you for taking time to come for your Medicare Wellness Visit. I appreciate your ongoing commitment to your health goals. Please review the following plan we discussed and let me know if I can assist you in the future.   These are the goals we discussed: Goals    . DIET - INCREASE WATER INTAKE     Starting 02/05/2017, I will continue to drink at least 8 glasses of water daily.        This is a list of the screening recommended for you and due dates:  Health Maintenance  Topic Date Due  . Pneumonia vaccines (2 of 2 - PPSV23) 05/24/2017*  . Tetanus Vaccine  02/05/2018*  . Colon Cancer Screening  02/23/2018*  . Flu Shot  Completed  .  Hepatitis C: One time screening is recommended by Center for Disease Control  (CDC) for  adults born from [birth year] through 1967.   Completed  *Topic was postponed. The date shown is not the original due date.   Preventive Care for Adults  A healthy lifestyle and preventive care can promote health and wellness. Preventive health guidelines for adults include the following key practices.  . A routine yearly physical is a good way to check with your health care provider about your health and preventive screening. It is a chance to share any concerns and updates on your health and to receive a thorough exam.  . Visit your dentist for a routine exam and preventive care every 6 months. Brush your teeth twice a day and floss once a day. Good oral hygiene prevents tooth decay and gum disease.  . The frequency of eye exams is based on your age, health, family medical history, use  of contact lenses, and other factors. Follow your health care provider's recommendations for frequency of eye exams.  . Eat a healthy diet. Foods like vegetables, fruits, whole grains, low-fat dairy products, and lean protein foods contain the nutrients you need without too many calories. Decrease your intake of foods high in solid fats, added sugars, and salt. Eat the  right amount of calories for you. Get information about a proper diet from your health care provider, if necessary.  . Regular physical exercise is one of the most important things you can do for your health. Most adults should get at least 150 minutes of moderate-intensity exercise (any activity that increases your heart rate and causes you to sweat) each week. In addition, most adults need muscle-strengthening exercises on 2 or more days a week.  Silver Sneakers may be a benefit available to you. To determine eligibility, you may visit the website: www.silversneakers.com or contact program at 754-323-1303 Mon-Fri between 8AM-8PM.   . Maintain a healthy weight. The body mass index (BMI) is a screening tool to identify possible weight problems. It provides an estimate of body fat based on height and weight. Your health care provider can find your BMI and can help you achieve or maintain a healthy weight.   For adults 20 years and older: ? A BMI below 18.5 is considered underweight. ? A BMI of 18.5 to 24.9 is normal. ? A BMI of 25 to 29.9 is considered overweight. ? A BMI of 30 and above is considered obese.   . Maintain normal blood lipids and cholesterol levels by exercising and minimizing your intake of saturated fat. Eat a balanced diet with plenty of fruit and vegetables. Blood tests for lipids and cholesterol should begin at age 71 and  be repeated every 5 years. If your lipid or cholesterol levels are high, you are over 50, or you are at high risk for heart disease, you may need your cholesterol levels checked more frequently. Ongoing high lipid and cholesterol levels should be treated with medicines if diet and exercise are not working.  . If you smoke, find out from your health care provider how to quit. If you do not use tobacco, please do not start.  . If you choose to drink alcohol, please do not consume more than 2 drinks per day. One drink is considered to be 12 ounces (355 mL) of  beer, 5 ounces (148 mL) of wine, or 1.5 ounces (44 mL) of liquor.  . If you are 73-38 years old, ask your health care provider if you should take aspirin to prevent strokes.  . Use sunscreen. Apply sunscreen liberally and repeatedly throughout the day. You should seek shade when your shadow is shorter than you. Protect yourself by wearing Novacek sleeves, pants, a wide-brimmed hat, and sunglasses year round, whenever you are outdoors.  . Once a month, do a whole body skin exam, using a mirror to look at the skin on your back. Tell your health care provider of new moles, moles that have irregular borders, moles that are larger than a pencil eraser, or moles that have changed in shape or color.

## 2017-02-05 NOTE — Progress Notes (Signed)
Medical screening examination/treatment/procedure(s) were performed by registered nurse and as supervising non-physician practitioner, I was immediately available for consultation/collaboration.   BAITY, REGINA, NP  

## 2017-02-06 LAB — HEPATITIS C ANTIBODY
Hepatitis C Ab: NONREACTIVE
SIGNAL TO CUT-OFF: 0.02 (ref <1.00)

## 2017-02-12 ENCOUNTER — Encounter: Payer: Self-pay | Admitting: Primary Care

## 2017-02-12 ENCOUNTER — Encounter: Payer: Medicare PPO | Admitting: Primary Care

## 2017-02-12 ENCOUNTER — Ambulatory Visit (INDEPENDENT_AMBULATORY_CARE_PROVIDER_SITE_OTHER): Payer: Medicare PPO | Admitting: Primary Care

## 2017-02-12 VITALS — BP 118/76 | HR 65 | Temp 97.7°F | Ht 71.5 in | Wt 225.8 lb

## 2017-02-12 DIAGNOSIS — I5022 Chronic systolic (congestive) heart failure: Secondary | ICD-10-CM

## 2017-02-12 DIAGNOSIS — E785 Hyperlipidemia, unspecified: Secondary | ICD-10-CM | POA: Diagnosis not present

## 2017-02-12 DIAGNOSIS — C61 Malignant neoplasm of prostate: Secondary | ICD-10-CM | POA: Diagnosis not present

## 2017-02-12 DIAGNOSIS — Z0000 Encounter for general adult medical examination without abnormal findings: Secondary | ICD-10-CM

## 2017-02-12 DIAGNOSIS — D709 Neutropenia, unspecified: Secondary | ICD-10-CM

## 2017-02-12 DIAGNOSIS — I2510 Atherosclerotic heart disease of native coronary artery without angina pectoris: Secondary | ICD-10-CM

## 2017-02-12 DIAGNOSIS — R7303 Prediabetes: Secondary | ICD-10-CM | POA: Diagnosis not present

## 2017-02-12 NOTE — Assessment & Plan Note (Addendum)
Following with cardiology. Continue ARB and beta blocker.

## 2017-02-12 NOTE — Assessment & Plan Note (Addendum)
PSA negative in June 2018. Not currently following with Urology. Repeat PSA in 6 months.

## 2017-02-12 NOTE — Assessment & Plan Note (Addendum)
Lipid panel stable from October 2018. Continue Crestor and work on diet and regular exercise.

## 2017-02-12 NOTE — Patient Instructions (Addendum)
  Start exercising. You should be getting 150 minutes of moderate intensity exercise weekly.  It's important to improve your diet by reducing consumption of fast food, fried food, processed snack foods, sugary drinks. Increase consumption of fresh vegetables and fruits, whole grains, water.  Ensure you are drinking 64 ounces of water daily.  Please schedule a follow up appointment in 6 months to recheck some labs. Make sure you don't eat anything 4 hours before this appointment.   It was a pleasure to see you today!

## 2017-02-12 NOTE — Assessment & Plan Note (Signed)
History of prostate cancer that is in remission.  WBC count gradually decreased over the past one year, normal WBC count in early October 2017.  Recommended he see a hematologist given low WBC count and history of prostate cancer, he kindly declines. Discussed the potential meaning of low WBC count, he verbalized understanding. He is asymptomatic.  Will repeat labs in 6 months.

## 2017-02-12 NOTE — Progress Notes (Signed)
Subjective:    Patient ID: Shane Kim, male    DOB: 1946/01/10, 71 y.o.   MRN: [id number]  HPI  Mr. Zechman is a 71 year old male who presents today for complete physical.  Immunizations: -Tetanus: Unsure -Influenza: Completed this season -Pneumonia: Completed Prevnar in 2017, states he had a pneumonia vaccination at [facility] last week. -Shingles: Declines  Diet: He endorses a fair diet. Breakfast: Soup Lunch: Sandwich Dinner: Fish, chicken, vegetables, starch Snacks: Occasionally chips Desserts: Cookies occasionally  Beverages: Soda, chocolate milk, some sweet tea, water  Exercise: He will walk 2-3 miles every other day when the weather is warmer Eye exam: Completed years ago Dental exam: No recent evaluation Colonoscopy: Completed in 2005, declines colonoscopy and Cologuard PSA: Negative in 2018 Hep C Screen: Negative in 2018   Review of Systems  Constitutional: Negative for unexpected weight change.  HENT: Negative for rhinorrhea.   Respiratory: Negative for cough and shortness of breath.   Cardiovascular: Negative for chest pain.  Gastrointestinal: Negative for constipation and diarrhea.  Genitourinary: Negative for difficulty urinating.  Musculoskeletal: Negative for arthralgias and myalgias.  Skin: Negative for rash.  Allergic/Immunologic: Negative for environmental allergies.  Neurological: Negative for dizziness, numbness and headaches.  Psychiatric/Behavioral:       Denies concerns for anxiety and depression       Past Medical History:  Diagnosis Date  . Acute systolic CHF (congestive heart failure) ([place])    a. echo 11/21/15: EF <20%, mildly dilated LV, severe diffuse global HK, GR3DD, mod MR, LA mildly dilated, RV sys fxn mildly reduced, PASP 52 mmHg  . CAD (coronary artery disease)    a. cardiac cath 11/22/15: dLM 40%, pLAD 80-90%, mLAD sequential 40%, D2 90%, mLCx 90% at takeoff of OM1, RCA appeared occluded proximally, LVEF 20%, PCWP 29   .  Chickenpox   . Chronic diastolic CHF (congestive heart failure) ([place])   . HLD (hyperlipidemia)   . Prostate cancer [facility])      Social History   Socioeconomic History  . Marital status: Married    Spouse name: Not on file  . Number of children: Not on file  . Years of education: Not on file  . Highest education level: Not on file  Social Needs  . Financial resource strain: Not on file  . Food insecurity - worry: Not on file  . Food insecurity - inability: Not on file  . Transportation needs - medical: Not on file  . Transportation needs - non-medical: Not on file  Occupational History  . Not on file  Tobacco Use  . Smoking status: Never Smoker  . Smokeless tobacco: Never Used  Substance and Sexual Activity  . Alcohol use: No  . Drug use: No  . Sexual activity: Not on file  Other Topics Concern  . Not on file  Social History Narrative   Married. Lives with his wife.   2 children, 2 grandchildren.   Retired. Worked as a Furniture conservator/restorer.   Enjoys fishing.     Past Surgical History:  Procedure Laterality Date  . CARDIAC CATHETERIZATION N/A 11/22/2015   Procedure: Right/Left Heart Cath and Coronary Angiography;  Surgeon: Minna Merritts, MD;  Location: [place] [facility];  Service: Cardiovascular;  Laterality: N/A;  . CARDIAC CATHETERIZATION Left 11/29/2015   Procedure: LEFT FEMORAL ARTERIAL LINE INSERTION;  Surgeon: Gaye Pollack, MD;  Location: [place];  Service: Open Heart Surgery;  Laterality: Left;  . CORONARY ARTERY BYPASS GRAFT N/A 11/29/2015  Procedure: CORONARY ARTERY BYPASS GRAFTING (CABG) x four, using left internal mammary artery and right leg saphenous vein harvested endoscopically;  Surgeon: Gaye Pollack, MD;  Location: [place] [facility];  Service: Open Heart Surgery;  Laterality: N/A;  . TEE WITHOUT CARDIOVERSION N/A 11/29/2015   Procedure: TRANSESOPHAGEAL ECHOCARDIOGRAM (TEE);  Surgeon: Gaye Pollack, MD;  Location: [place];  Service: Open Heart Surgery;  Laterality: N/A;  .  TURP VAPORIZATION      Family History  Problem Relation Age of Onset  . Hypertension Mother   . Healthy Father     No Known Allergies  Current Outpatient Medications on File Prior to Visit  Medication Sig Dispense Refill  . aspirin (RA ASPIRIN EC ADULT LOW ST) 81 MG EC tablet Take 1 tablet (81 mg total) by mouth daily. Swallow whole. 30 tablet 3  . carvedilol (COREG) 6.25 MG tablet Take 1.5 tablets (9.375 mg total) by mouth 2 (two) times daily. 90 tablet 6  . finasteride (PROSCAR) 5 MG tablet Take 1 tablet (5 mg total) by mouth daily. 30 tablet 3  . losartan (COZAAR) 25 MG tablet Take 1 tablet (25 mg total) by mouth daily. 30 tablet 6  . rosuvastatin (CRESTOR) 20 MG tablet Take 1 tablet (20 mg total) by mouth at bedtime. 90 tablet 1  . spironolactone (ALDACTONE) 25 MG tablet Take 1 tablet (25 mg total) by mouth daily. 30 tablet 3   No current facility-administered medications on file prior to visit.     BP 118/76   Pulse 65   Temp 97.7 F (36.5 C) (Oral)   Ht 5' 11.5" (1.816 m)   Wt 225 lb 12.8 oz (102.4 kg)   SpO2 97%   BMI 31.05 kg/m    Objective:   Physical Exam  Constitutional: He is oriented to person, place, and time. He appears well-nourished.  HENT:  Right Ear: Tympanic membrane and ear canal normal.  Left Ear: Tympanic membrane and ear canal normal.  Nose: Nose normal. Right sinus exhibits no maxillary sinus tenderness and no frontal sinus tenderness. Left sinus exhibits no maxillary sinus tenderness and no frontal sinus tenderness.  Mouth/Throat: Oropharynx is clear and moist.  Eyes: Conjunctivae and EOM are normal. Pupils are equal, round, and reactive to light.  Neck: Neck supple. Carotid bruit is not present. No thyromegaly present.  Cardiovascular: Normal rate, regular rhythm and normal heart sounds.  Pulmonary/Chest: Effort normal and breath sounds normal. He has no wheezes. He has no rales.  Abdominal: Soft. Bowel sounds are normal. There is no  tenderness.  Musculoskeletal: Normal range of motion.  Neurological: He is alert and oriented to person, place, and time. He has normal reflexes. No cranial nerve deficit.  Skin: Skin is warm and dry.  Psychiatric: He has a normal mood and affect.          Assessment & Plan:

## 2017-02-12 NOTE — Assessment & Plan Note (Addendum)
Following with cardiology, denies abrupt weight changes. He's weighing himself daily. Continue ARB, spironolactone, and beta blocker.

## 2017-02-12 NOTE — Assessment & Plan Note (Signed)
Recent A1C of 6.0 in October 2018. Discussed to continue to work on diet and exercise. Repeat in 6 months.

## 2017-02-12 NOTE — Assessment & Plan Note (Signed)
Pneumonia vaccinations UTD, declines TD and Shingles. PSA UTD. Declines colonoscopy or Cologuard. Exam unremarkable. Labs with neutropenia which was discussed. Follow up in 6 months.

## 2017-03-21 ENCOUNTER — Other Ambulatory Visit: Payer: Self-pay | Admitting: Primary Care

## 2017-03-21 DIAGNOSIS — E785 Hyperlipidemia, unspecified: Secondary | ICD-10-CM

## 2017-05-17 IMAGING — CR DG CHEST 1V PORT
1 series · 1 of 1 positions shown · non-contrast
Comparison: 11/26/2015

CLINICAL DATA: Status post CABG surgery.

EXAM:
PORTABLE CHEST 1 VIEW

[AP]
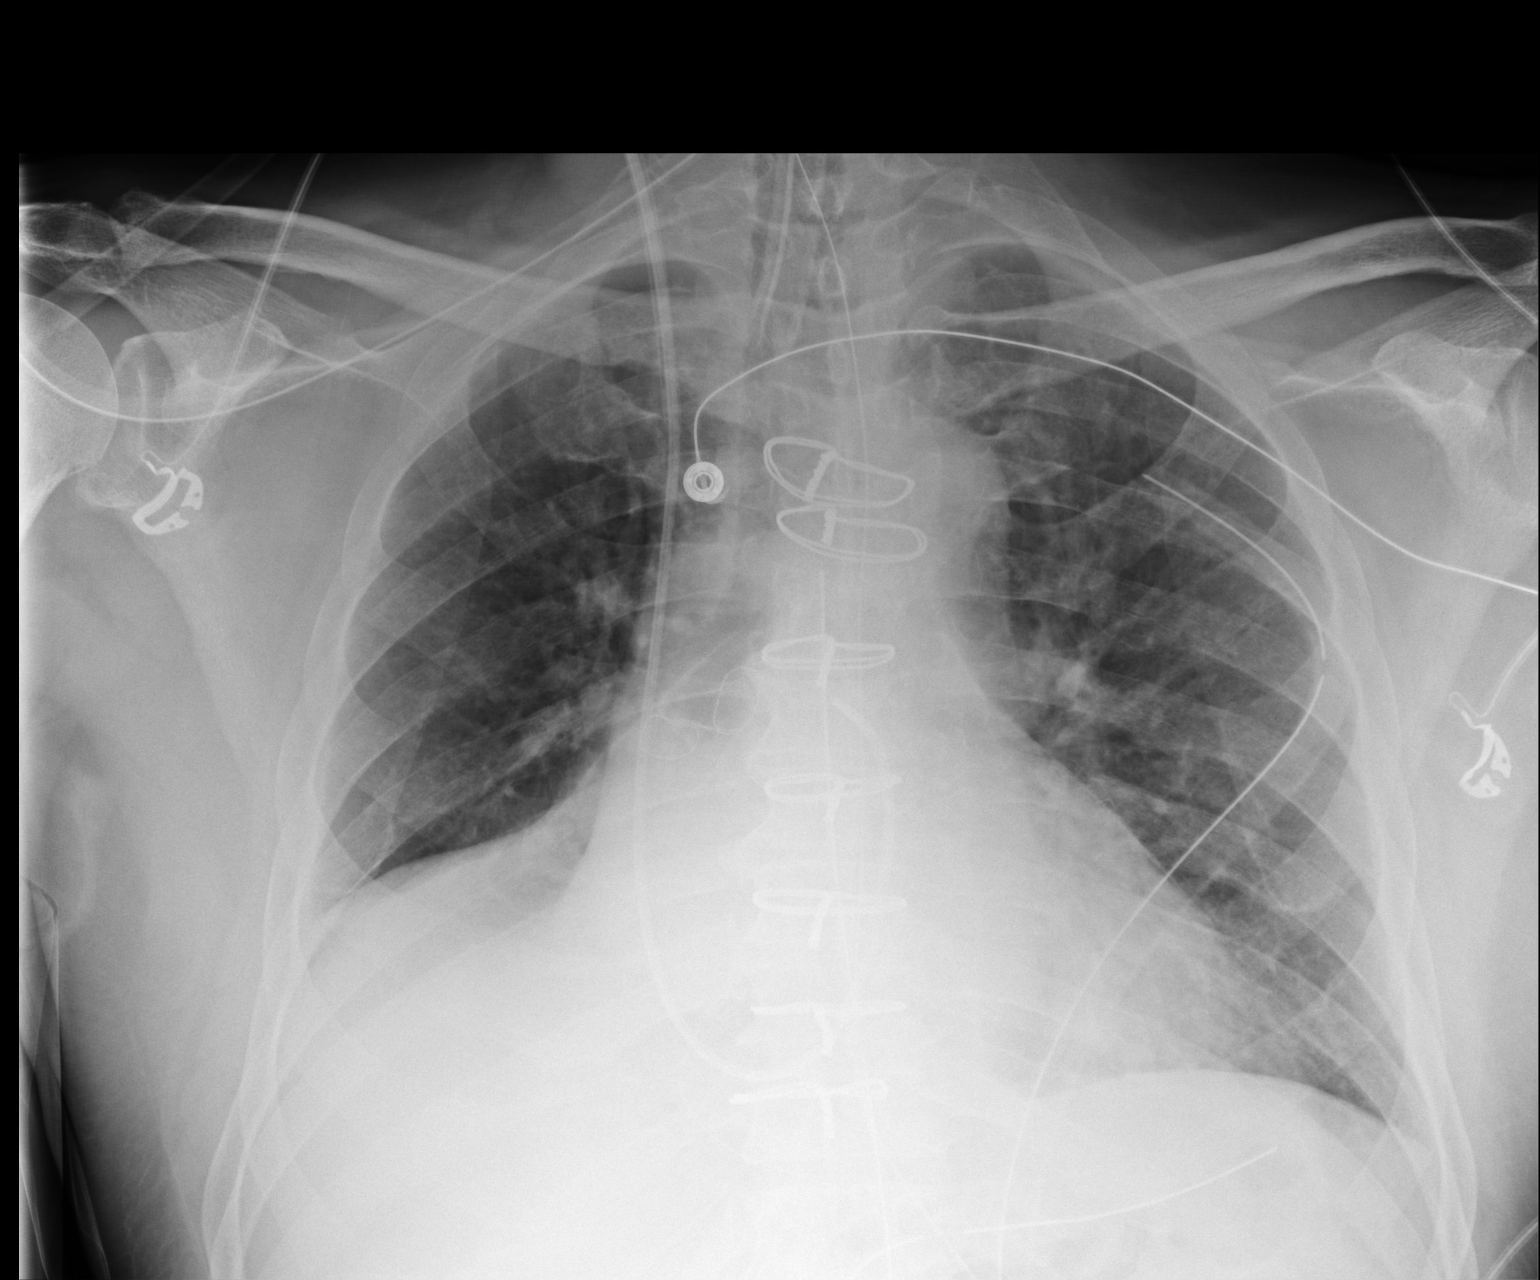

[1 of 1 positions shown; findings below may reference images not displayed]

FINDINGS: Since the prior exam, CABG surgery has been performed. The standard
lines and tubes are in place, including: An endotracheal tube with
its tip projecting 6.5 cm above the carina, a right internal jugular
Swan-Ganz catheter with its tip directed towards the right pulmonary
artery, a nasal/orogastric tube passing below the diaphragm well
into the stomach, a mediastinal tube and a left-sided chest tube.

The cardiac silhouette is mildly enlarged. There is no mediastinal
widening.

Lungs are essentially clear. No convincing pleural effusion. No
pneumothorax.
IMPRESSION: 1. No evidence of an operative complication. No mediastinal
widening, pulmonary edema or pneumothorax.
2. Endotracheal tube tip projects 6.5 cm above the carina. Consider
further insertion for more optimal positioning.
3. Remaining support apparatus is well positioned.

## 2017-05-18 ENCOUNTER — Other Ambulatory Visit (HOSPITAL_COMMUNITY): Payer: Self-pay | Admitting: *Deleted

## 2017-05-18 IMAGING — CR DG CHEST 1V PORT
1 series · 1 of 1 positions shown · non-contrast
Comparison: 11/29/2015

CLINICAL DATA: Status post cardiac surgery.  Subsequent encounter.

EXAM:
PORTABLE CHEST 1 VIEW

[AP]
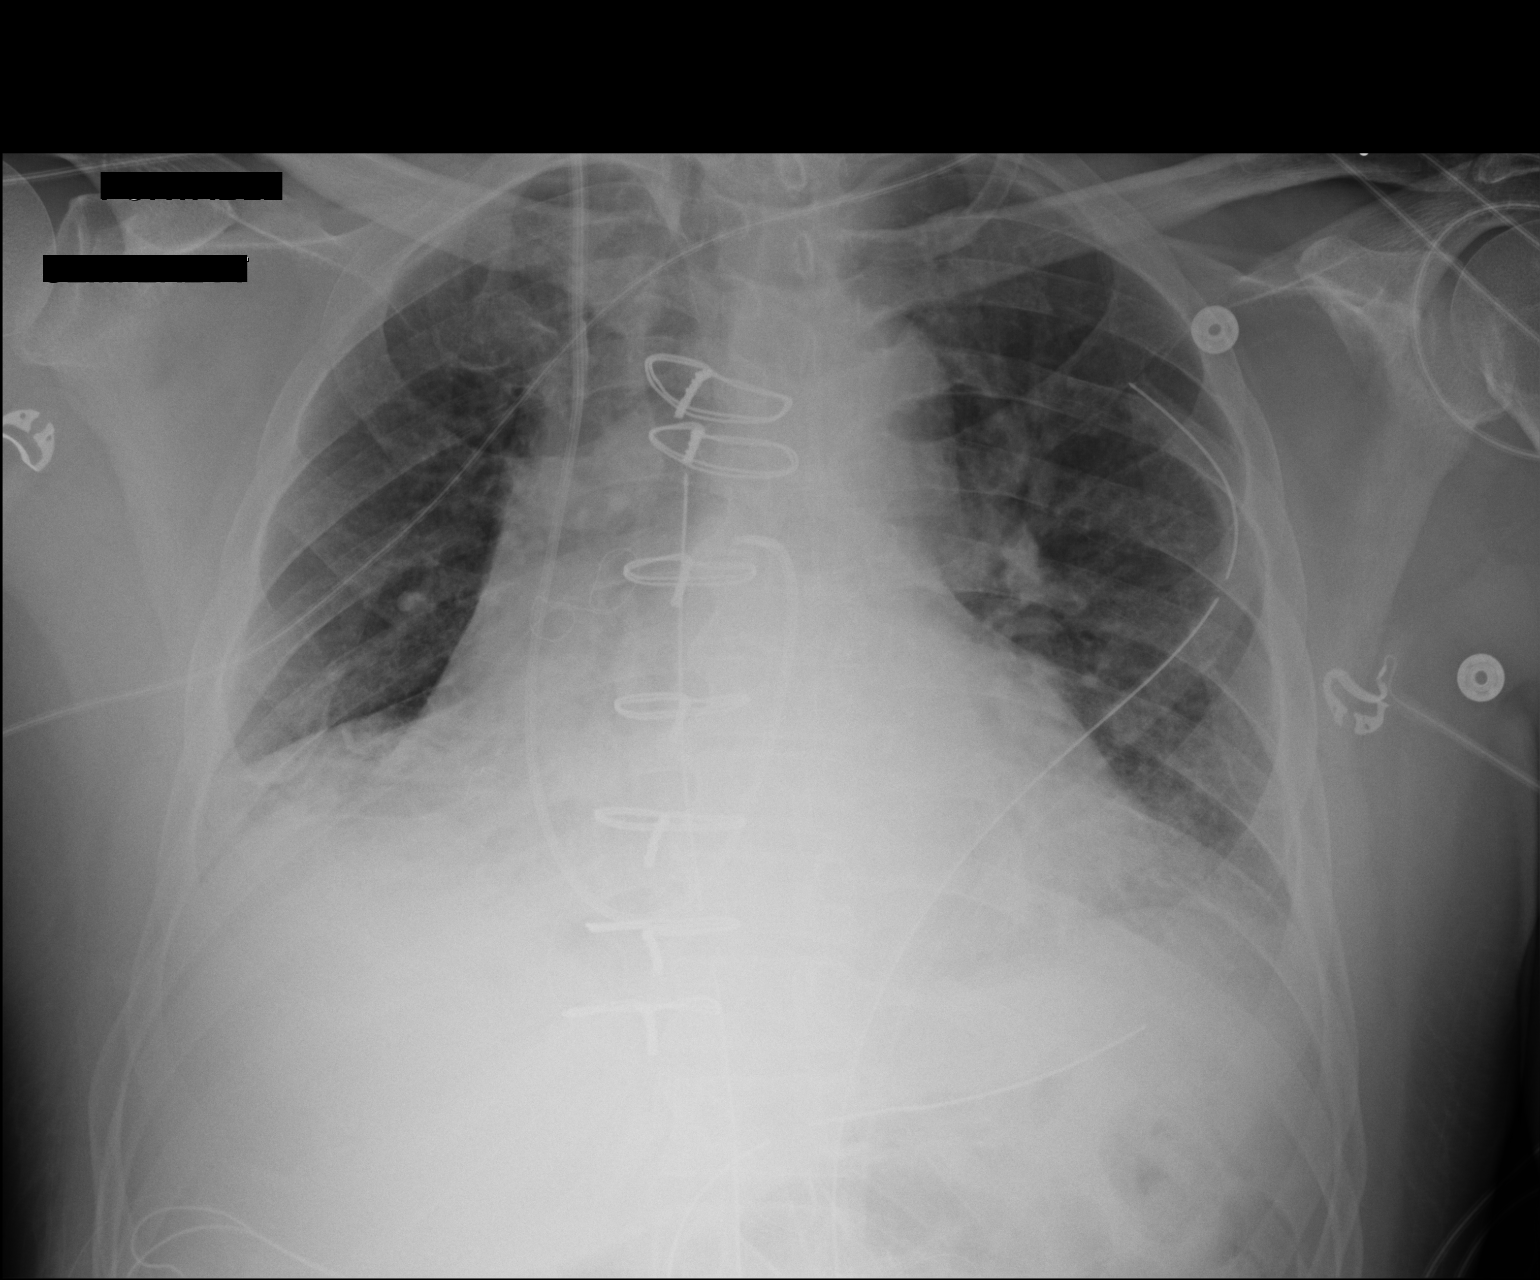

[1 of 1 positions shown; findings below may reference images not displayed]

FINDINGS: Since the prior exam, the endotracheal tube and the nasal/
orogastric tube have been removed. Mediastinal tube and left chest
tubes as well as the right internal jugular Swan-Ganz catheter are
stable.

Cardiac silhouette is mildly enlarged, but stable. There is no
mediastinal widening.

Lung base opacity is stable consistent with atelectasis. No
pulmonary edema.

No pneumothorax.
IMPRESSION: 1. No acute abnormality or evidence of an operative complication.
2. Mild lung base opacity consistent with atelectasis, stable from
the previous day's study.
3. Remaining support apparatus is well positioned.

## 2017-05-18 MED ORDER — SPIRONOLACTONE 25 MG PO TABS: 25.0000 mg | ORAL_TABLET | Freq: Every day | ORAL | 3 refills | Status: DC

## 2017-05-18 MED ORDER — FINASTERIDE 5 MG PO TABS: 5.0000 mg | ORAL_TABLET | Freq: Every day | ORAL | 3 refills | Status: DC

## 2017-05-19 IMAGING — CR DG CHEST 1V PORT
1 series · 1 of 1 positions shown · non-contrast
Comparison: November 30, 2015

CLINICAL DATA: Recent coronary artery bypass grafting. Atelectasis.

EXAM:
PORTABLE CHEST 1 VIEW

[AP]
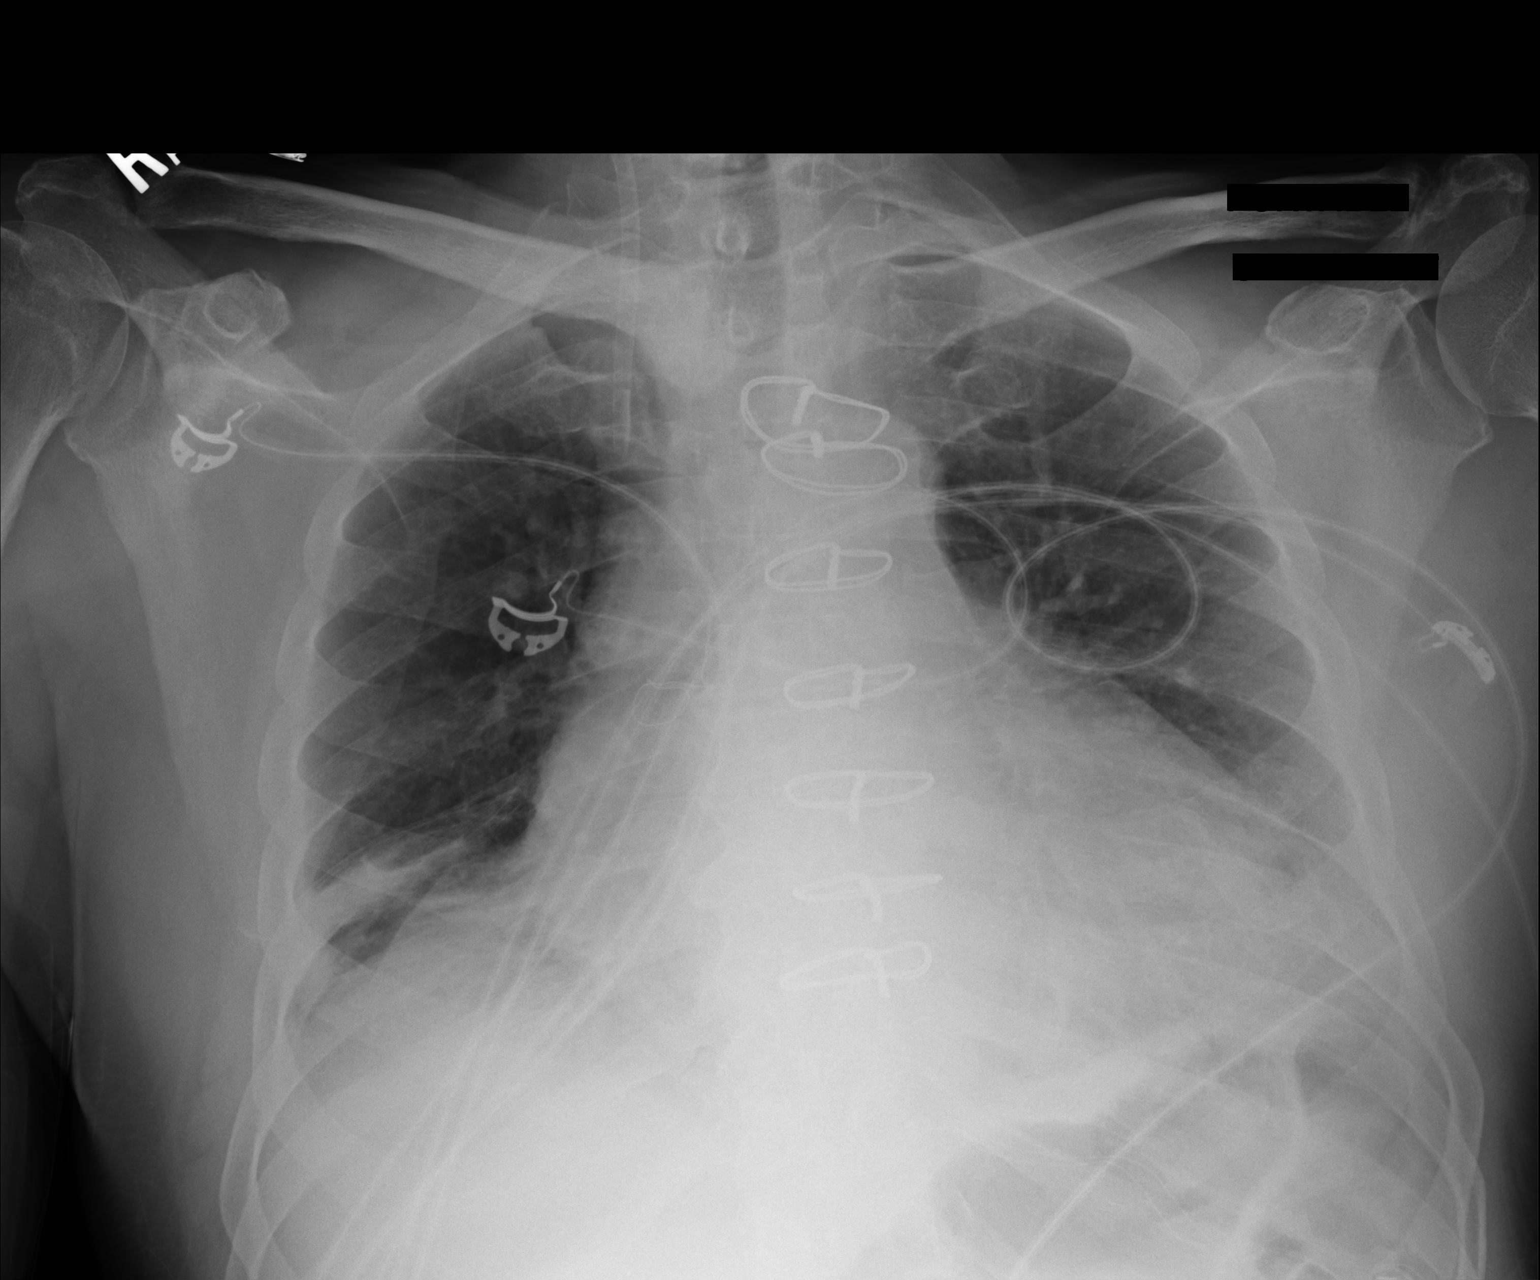

[1 of 1 positions shown; findings below may reference images not displayed]

FINDINGS: Swan-Ganz catheter is been removed. Cordis tip is in the superior
vena cava. Temporary pacemaker wires are attached to the right
heart. Left chest tube and mediastinal drain have been removed. No
evident pneumothorax. There is persistent atelectatic change in the
right base. There is a minimal pleural effusion on the left. Lungs
elsewhere clear. There is stable cardiomegaly. The pulmonary
vascularity is normal. No adenopathy.
IMPRESSION: Cordis tip is in the superior vena cava. No pneumothorax. Persistent
right base atelectasis. Minimal left pleural effusion. Stable
cardiomegaly.

## 2017-05-20 IMAGING — CR DG CHEST 1V PORT
1 series · 1 of 1 positions shown · non-contrast
Comparison: December 01, 2015

CLINICAL DATA: Atelectasis

EXAM:
PORTABLE CHEST 1 VIEW

[AP]
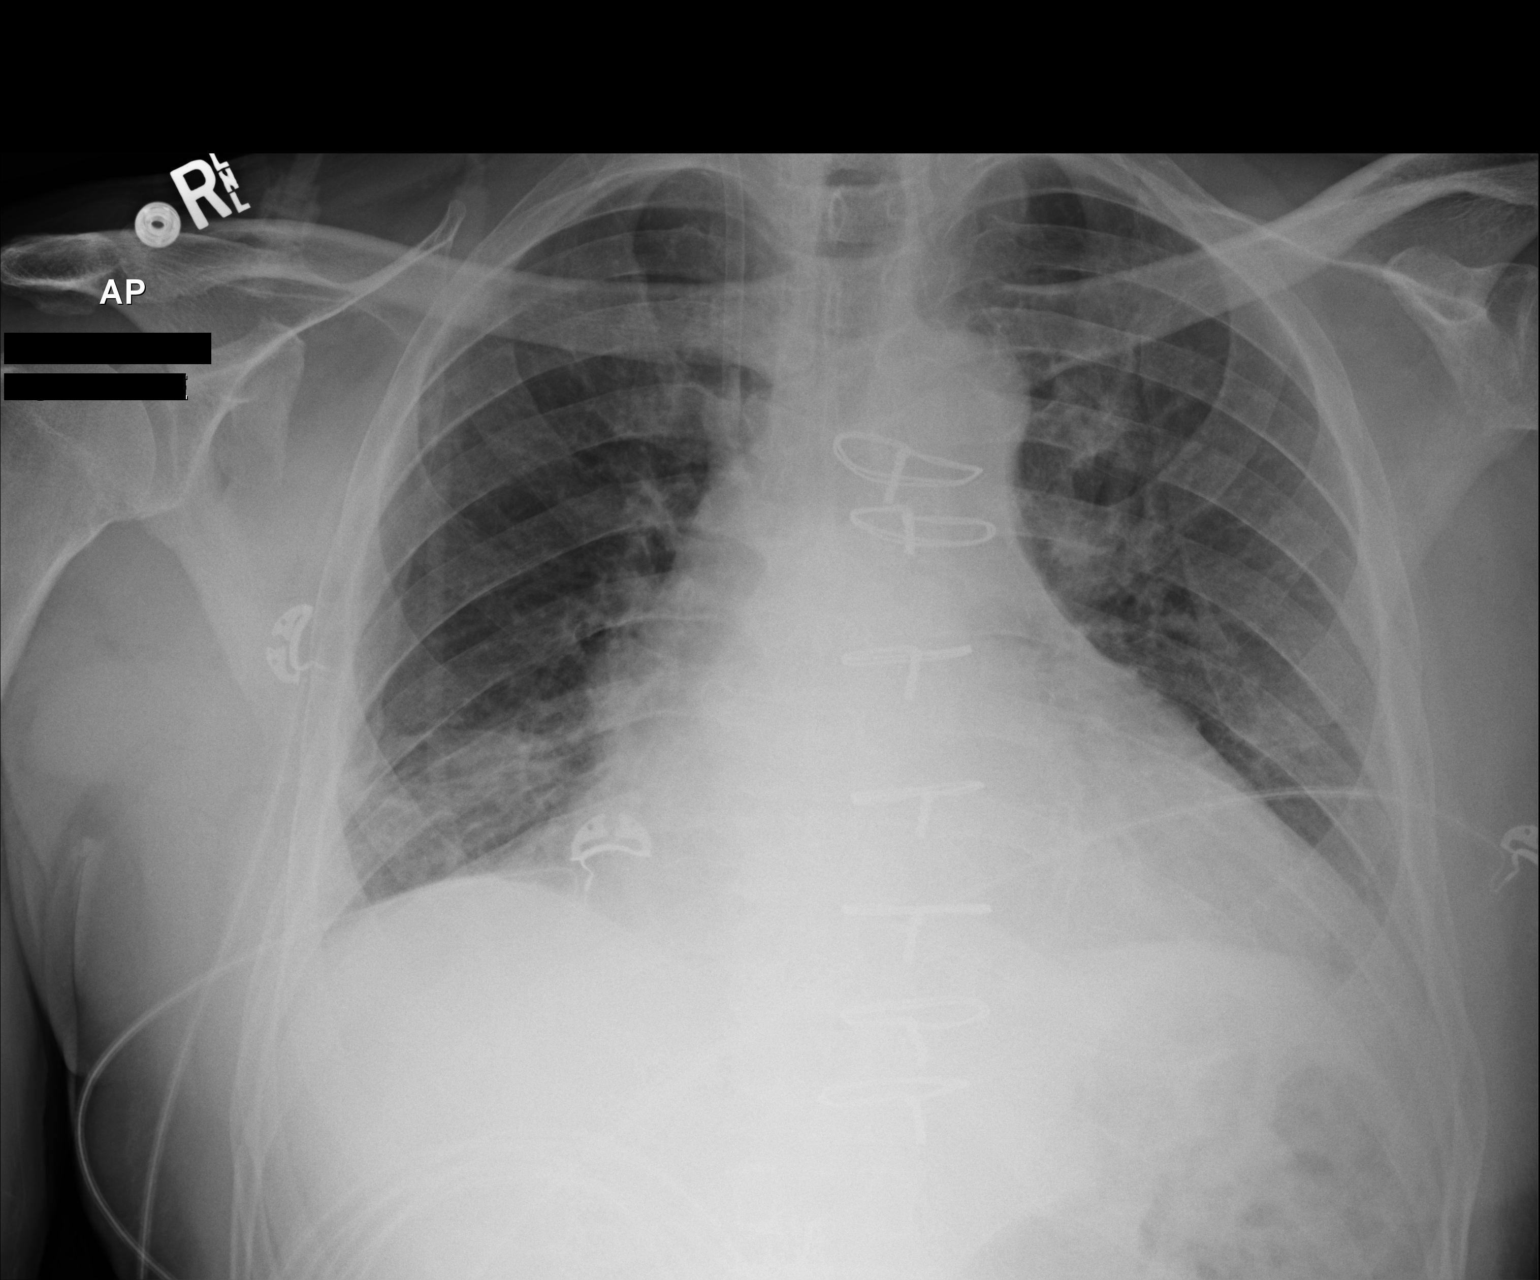

[1 of 1 positions shown; findings below may reference images not displayed]

FINDINGS: Cordis tip in superior vena cava. No pneumothorax. There is
persistent right base atelectasis. Minimal left pleural effusion
remains. No new opacity. Stable cardiac enlargement. Pulmonary
vascularity within normal limits. No adenopathy.
IMPRESSION: Stable right base atelectasis. Minimal left effusion. Stable
cardiomegaly. No pneumothorax.

## 2017-08-11 ENCOUNTER — Other Ambulatory Visit (HOSPITAL_COMMUNITY): Payer: Self-pay

## 2017-08-11 MED ORDER — CARVEDILOL 6.25 MG PO TABS: 9.3750 mg | ORAL_TABLET | Freq: Two times a day (BID) | ORAL | 0 refills | Status: DC

## 2017-08-14 ENCOUNTER — Other Ambulatory Visit: Payer: Self-pay | Admitting: Primary Care

## 2017-08-14 ENCOUNTER — Other Ambulatory Visit (HOSPITAL_COMMUNITY): Payer: Self-pay | Admitting: *Deleted

## 2017-08-14 ENCOUNTER — Ambulatory Visit (INDEPENDENT_AMBULATORY_CARE_PROVIDER_SITE_OTHER): Payer: Medicare PPO | Admitting: Primary Care

## 2017-08-14 ENCOUNTER — Encounter: Payer: Self-pay | Admitting: Primary Care

## 2017-08-14 VITALS — BP 118/72 | HR 72 | Temp 98.0°F | Ht 71.5 in | Wt 226.8 lb

## 2017-08-14 DIAGNOSIS — C61 Malignant neoplasm of prostate: Secondary | ICD-10-CM

## 2017-08-14 DIAGNOSIS — D709 Neutropenia, unspecified: Secondary | ICD-10-CM

## 2017-08-14 DIAGNOSIS — E785 Hyperlipidemia, unspecified: Secondary | ICD-10-CM

## 2017-08-14 DIAGNOSIS — R7303 Prediabetes: Secondary | ICD-10-CM

## 2017-08-14 LAB — CBC
HEMATOCRIT: 36.3 % — AB (ref 39.0–52.0)
HEMOGLOBIN: 12.6 g/dL — AB (ref 13.0–17.0)
MCHC: 34.9 g/dL (ref 30.0–36.0)
MCV: 93.9 fl (ref 78.0–100.0)
PLATELETS: 166 10*3/uL (ref 150.0–400.0)
RBC: 3.86 Mil/uL — ABNORMAL LOW (ref 4.22–5.81)
RDW: 13.5 % (ref 11.5–15.5)
WBC: 2.9 10*3/uL — AB (ref 4.0–10.5)

## 2017-08-14 LAB — LIPID PANEL
CHOLESTEROL: 202 mg/dL — AB (ref 0–200)
HDL: 50.6 mg/dL (ref >39.00)
LDL Cholesterol: 130 mg/dL — ABNORMAL HIGH (ref 0–99)
NonHDL: 151.3
Total CHOL/HDL Ratio: 4
Triglycerides: 109 mg/dL (ref 0.0–149.0)
VLDL: 21.8 mg/dL (ref 0.0–40.0)

## 2017-08-14 LAB — HEMOGLOBIN A1C: HEMOGLOBIN A1C: 6.4 % (ref 4.6–6.5)

## 2017-08-14 LAB — PSA: PSA: 2.02 ng/mL (ref 0.10–4.00)

## 2017-08-14 MED ORDER — LOSARTAN POTASSIUM 25 MG PO TABS: 25.0000 mg | ORAL_TABLET | Freq: Every day | ORAL | 6 refills | Status: DC

## 2017-08-14 NOTE — Assessment & Plan Note (Signed)
Repeat lipids pending today. Compliant to Crestor.

## 2017-08-14 NOTE — Assessment & Plan Note (Signed)
Discussed to consume more vegetables, fruit, whole grains. Discussed to increase exercise. Repeat A1C pending today.

## 2017-08-14 NOTE — Assessment & Plan Note (Signed)
Due for repeat CBC today. Normal WBC count in late September 2018. Asymptomatic and continues to decline hematology evaluation. Continue to monitor.

## 2017-08-14 NOTE — Patient Instructions (Addendum)
Stop by the lab prior to leaving today. I will notify you of your results once received.   Continue taking your medications as discussed.   We will see you in December this year for your physical.  It was a pleasure to see you today!

## 2017-08-14 NOTE — Progress Notes (Signed)
Subjective:    Patient ID: Shane Kim, male    DOB: Jul 14, 1945, 72 y.o.   MRN: [id number]  HPI  Ms. Shane Kim is a 72 year old male who presents today for follow up and for labs.   1) Prediabetes: A1C of 6.0 in October 2018.  Diet currently consists of:  Breakfast: Oatmeal, eggs, sausage sometimes Lunch: Chicken, greens Dinner: Fish, chicken Snacks: Fruit Desserts: Candy, cake, doughnuts 1-2 times weekly Beverages: Water, sprite, orange juice  Exercise: He is active, not regularly exercising  2) Hyperlipidemia: Currently managed on Crestor. Lipid panel in October with LDL of 111. He is due for repeat lipid testing today.   BP Readings from Last 3 Encounters:  08/14/17 118/72  02/12/17 118/76  02/05/17 106/70     3) Neutropenia: WBC counts of 2.3 with Hemoglobin of 10.8 in December 2018. History of neutropenia dating back to October 2017. He's never undergone evaluation for this in the past. WBC count in September 2017 at 5.2. He denies fatigue, shortness of breath. He does have a history of prostate cancer. He has declined hematology evaluation during prior visits.    Review of Systems  Constitutional: Negative for fatigue and fever.  Eyes: Negative for visual disturbance.  Respiratory: Negative for shortness of breath.   Cardiovascular: Negative for chest pain.  Neurological: Negative for dizziness and weakness.       Past Medical History:  Diagnosis Date  . Acute systolic CHF (congestive heart failure) ([place])    a. echo 11/21/15: EF <20%, mildly dilated LV, severe diffuse global HK, GR3DD, mod MR, LA mildly dilated, RV sys fxn mildly reduced, PASP 52 mmHg  . CAD (coronary artery disease)    a. cardiac cath 11/22/15: dLM 40%, pLAD 80-90%, mLAD sequential 40%, D2 90%, mLCx 90% at takeoff of OM1, RCA appeared occluded proximally, LVEF 20%, PCWP 29   . Chickenpox   . Chronic diastolic CHF (congestive heart failure) ([place])   . HLD (hyperlipidemia)   . Prostate cancer  [facility])      Social History   Socioeconomic History  . Marital status: Married    Spouse name: Not on file  . Number of children: Not on file  . Years of education: Not on file  . Highest education level: Not on file  Occupational History  . Not on file  Social Needs  . Financial resource strain: Not on file  . Food insecurity:    Worry: Not on file    Inability: Not on file  . Transportation needs:    Medical: Not on file    Non-medical: Not on file  Tobacco Use  . Smoking status: Never Smoker  . Smokeless tobacco: Never Used  Substance and Sexual Activity  . Alcohol use: No  . Drug use: No  . Sexual activity: Not on file  Lifestyle  . Physical activity:    Days per week: Not on file    Minutes per session: Not on file  . Stress: Not on file  Relationships  . Social connections:    Talks on phone: Not on file    Gets together: Not on file    Attends religious service: Not on file    Active member of club or organization: Not on file    Attends meetings of clubs or organizations: Not on file    Relationship status: Not on file  . Intimate partner violence:    Fear of current or ex partner: Not on file    Emotionally  abused: Not on file    Physically abused: Not on file    Forced sexual activity: Not on file  Other Topics Concern  . Not on file  Social History Narrative   Married. Lives with his wife.   2 children, 2 grandchildren.   Retired. Worked as a Furniture conservator/restorer.   Enjoys fishing.     Past Surgical History:  Procedure Laterality Date  . CARDIAC CATHETERIZATION N/A 11/22/2015   Procedure: Right/Left Heart Cath and Coronary Angiography;  Surgeon: Minna Merritts, MD;  Location: [place] [facility];  Service: Cardiovascular;  Laterality: N/A;  . CARDIAC CATHETERIZATION Left 11/29/2015   Procedure: LEFT FEMORAL ARTERIAL LINE INSERTION;  Surgeon: Gaye Pollack, MD;  Location: [place];  Service: Open Heart Surgery;  Laterality: Left;  . CORONARY ARTERY BYPASS  GRAFT N/A 11/29/2015   Procedure: CORONARY ARTERY BYPASS GRAFTING (CABG) x four, using left internal mammary artery and right leg saphenous vein harvested endoscopically;  Surgeon: Gaye Pollack, MD;  Location: [place] [facility];  Service: Open Heart Surgery;  Laterality: N/A;  . TEE WITHOUT CARDIOVERSION N/A 11/29/2015   Procedure: TRANSESOPHAGEAL ECHOCARDIOGRAM (TEE);  Surgeon: Gaye Pollack, MD;  Location: [place];  Service: Open Heart Surgery;  Laterality: N/A;  . TURP VAPORIZATION      Family History  Problem Relation Age of Onset  . Hypertension Mother   . Healthy Father     No Known Allergies  Current Outpatient Medications on File Prior to Visit  Medication Sig Dispense Refill  . aspirin (RA ASPIRIN EC ADULT LOW ST) 81 MG EC tablet Take 1 tablet (81 mg total) by mouth daily. Swallow whole. 30 tablet 3  . carvedilol (COREG) 6.25 MG tablet Take 1.5 tablets (9.375 mg total) by mouth 2 (two) times daily. 90 tablet 0  . finasteride (PROSCAR) 5 MG tablet Take 1 tablet (5 mg total) by mouth daily. 30 tablet 3  . losartan (COZAAR) 25 MG tablet Take 1 tablet (25 mg total) by mouth daily. 30 tablet 6  . rosuvastatin (CRESTOR) 20 MG tablet take 1 tablet by mouth at bedtime 90 tablet 1  . spironolactone (ALDACTONE) 25 MG tablet Take 1 tablet (25 mg total) by mouth daily. 30 tablet 3   No current facility-administered medications on file prior to visit.     BP 118/72   Pulse 72   Temp 98 F (36.7 C) (Oral)   Ht 5' 11.5" (1.816 m)   Wt 226 lb 12 oz (102.9 kg)   SpO2 97%   BMI 31.18 kg/m    Objective:   Physical Exam  Constitutional: He appears well-nourished.  Neck: Neck supple.  Cardiovascular: Normal rate and regular rhythm.  Respiratory: Effort normal and breath sounds normal.  Skin: Skin is warm and dry.           Assessment & Plan:

## 2017-08-14 NOTE — Assessment & Plan Note (Signed)
Repeat PSA pending. 

## 2017-08-19 ENCOUNTER — Encounter: Payer: Self-pay | Admitting: *Deleted

## 2017-09-11 ENCOUNTER — Other Ambulatory Visit (HOSPITAL_COMMUNITY): Payer: Self-pay | Admitting: Cardiology

## 2017-09-11 ENCOUNTER — Other Ambulatory Visit (HOSPITAL_COMMUNITY): Payer: Self-pay | Admitting: *Deleted

## 2017-09-11 MED ORDER — CARVEDILOL 6.25 MG PO TABS: 9.3750 mg | ORAL_TABLET | Freq: Two times a day (BID) | ORAL | 3 refills | Status: DC

## 2017-09-22 ENCOUNTER — Other Ambulatory Visit (HOSPITAL_COMMUNITY): Payer: Self-pay

## 2017-09-22 MED ORDER — FINASTERIDE 5 MG PO TABS: 5.0000 mg | ORAL_TABLET | Freq: Every day | ORAL | 0 refills | Status: DC

## 2017-09-22 MED ORDER — SPIRONOLACTONE 25 MG PO TABS: 25.0000 mg | ORAL_TABLET | Freq: Every day | ORAL | 0 refills | Status: DC

## 2017-10-20 ENCOUNTER — Other Ambulatory Visit (HOSPITAL_COMMUNITY): Payer: Self-pay | Admitting: Internal Medicine

## 2017-10-21 ENCOUNTER — Other Ambulatory Visit (HOSPITAL_COMMUNITY): Payer: Self-pay | Admitting: Pharmacist

## 2017-10-21 MED ORDER — ASPIRIN 81 MG PO TBEC: 81.0000 mg | DELAYED_RELEASE_TABLET | Freq: Every day | ORAL | 3 refills | Status: DC

## 2017-10-29 ENCOUNTER — Other Ambulatory Visit: Payer: Self-pay | Admitting: Primary Care

## 2017-10-29 DIAGNOSIS — E785 Hyperlipidemia, unspecified: Secondary | ICD-10-CM

## 2017-11-08 ENCOUNTER — Other Ambulatory Visit (HOSPITAL_COMMUNITY): Payer: Self-pay | Admitting: Internal Medicine

## 2017-11-20 ENCOUNTER — Other Ambulatory Visit (HOSPITAL_COMMUNITY): Payer: Self-pay | Admitting: Internal Medicine

## 2017-11-21 ENCOUNTER — Other Ambulatory Visit (HOSPITAL_COMMUNITY): Payer: Self-pay | Admitting: Internal Medicine

## 2017-12-18 ENCOUNTER — Other Ambulatory Visit (HOSPITAL_COMMUNITY): Payer: Self-pay | Admitting: Internal Medicine

## 2018-01-19 ENCOUNTER — Other Ambulatory Visit (HOSPITAL_COMMUNITY): Payer: Self-pay | Admitting: Internal Medicine

## 2018-01-22 ENCOUNTER — Other Ambulatory Visit (HOSPITAL_COMMUNITY): Payer: Self-pay | Admitting: Internal Medicine

## 2018-02-05 ENCOUNTER — Other Ambulatory Visit: Payer: Self-pay | Admitting: Primary Care

## 2018-02-05 DIAGNOSIS — R7303 Prediabetes: Secondary | ICD-10-CM

## 2018-02-05 DIAGNOSIS — C61 Malignant neoplasm of prostate: Secondary | ICD-10-CM

## 2018-02-05 DIAGNOSIS — E785 Hyperlipidemia, unspecified: Secondary | ICD-10-CM

## 2018-02-08 ENCOUNTER — Ambulatory Visit (INDEPENDENT_AMBULATORY_CARE_PROVIDER_SITE_OTHER): Payer: Medicare PPO

## 2018-02-08 VITALS — BP 122/80 | HR 74 | Temp 98.6°F | Ht 73.0 in | Wt 236.5 lb

## 2018-02-08 DIAGNOSIS — E785 Hyperlipidemia, unspecified: Secondary | ICD-10-CM | POA: Diagnosis not present

## 2018-02-08 DIAGNOSIS — C61 Malignant neoplasm of prostate: Secondary | ICD-10-CM | POA: Diagnosis not present

## 2018-02-08 DIAGNOSIS — R7303 Prediabetes: Secondary | ICD-10-CM

## 2018-02-08 DIAGNOSIS — Z0000 Encounter for general adult medical examination without abnormal findings: Secondary | ICD-10-CM | POA: Diagnosis not present

## 2018-02-08 LAB — COMPREHENSIVE METABOLIC PANEL
ALT: 11 U/L (ref 0–53)
AST: 17 U/L (ref 0–37)
Albumin: 4.4 g/dL (ref 3.5–5.2)
Alkaline Phosphatase: 52 U/L (ref 39–117)
BILIRUBIN TOTAL: 0.7 mg/dL (ref 0.2–1.2)
BUN: 19 mg/dL (ref 6–23)
CO2: 31 mEq/L (ref 19–32)
Calcium: 10.2 mg/dL (ref 8.4–10.5)
Chloride: 103 mEq/L (ref 96–112)
Creatinine, Ser: 1.27 mg/dL (ref 0.40–1.50)
GFR: 71.18 mL/min (ref >60.00)
Glucose, Bld: 95 mg/dL (ref 70–99)
Potassium: 4.5 mEq/L (ref 3.5–5.1)
Sodium: 139 mEq/L (ref 135–145)
TOTAL PROTEIN: 7.4 g/dL (ref 6.0–8.3)

## 2018-02-08 LAB — LIPID PANEL
Cholesterol: 195 mg/dL (ref 0–200)
HDL: 57 mg/dL (ref >39.00)
LDL Cholesterol: 123 mg/dL — ABNORMAL HIGH (ref 0–99)
NonHDL: 138.22
Total CHOL/HDL Ratio: 3
Triglycerides: 77 mg/dL (ref 0.0–149.0)
VLDL: 15.4 mg/dL (ref 0.0–40.0)

## 2018-02-08 LAB — HEMOGLOBIN A1C: HEMOGLOBIN A1C: 6.3 % (ref 4.6–6.5)

## 2018-02-08 LAB — PSA: PSA: 2.01 ng/mL (ref 0.10–4.00)

## 2018-02-08 NOTE — Patient Instructions (Signed)
Shane Kim , Thank you for taking time to come for your Medicare Wellness Visit. I appreciate your ongoing commitment to your health goals. Please review the following plan we discussed and let me know if I can assist you in the future.   These are the goals we discussed: Goals    . DIET - INCREASE WATER INTAKE     Starting 02/08/2018, I will continue to drink at least 1 gallon of water daily.        This is a list of the screening recommended for you and due dates:  Health Maintenance  Topic Date Due  . Colon Cancer Screening  02/23/2018*  . Tetanus Vaccine  02/24/2019*  . Flu Shot  Completed  .  Hepatitis C: One time screening is recommended by Center for Disease Control  (CDC) for  adults born from [birth year] through 1967.   Completed  . Pneumonia vaccines  Completed  *Topic was postponed. The date shown is not the original due date.   Preventive Care for Adults  A healthy lifestyle and preventive care can promote health and wellness. Preventive health guidelines for adults include the following key practices.  . A routine yearly physical is a good way to check with your health care provider about your health and preventive screening. It is a chance to share any concerns and updates on your health and to receive a thorough exam.  . Visit your dentist for a routine exam and preventive care every 6 months. Brush your teeth twice a day and floss once a day. Good oral hygiene prevents tooth decay and gum disease.  . The frequency of eye exams is based on your age, health, family medical history, use  of contact lenses, and other factors. Follow your health care provider's recommendations for frequency of eye exams.  . Eat a healthy diet. Foods like vegetables, fruits, whole grains, low-fat dairy products, and lean protein foods contain the nutrients you need without too many calories. Decrease your intake of foods high in solid fats, added sugars, and salt. Eat the right amount of calories  for you. Get information about a proper diet from your health care provider, if necessary.  . Regular physical exercise is one of the most important things you can do for your health. Most adults should get at least 150 minutes of moderate-intensity exercise (any activity that increases your heart rate and causes you to sweat) each week. In addition, most adults need muscle-strengthening exercises on 2 or more days a week.  Silver Sneakers may be a benefit available to you. To determine eligibility, you may visit the website: www.silversneakers.com or contact program at (575) [phone] Mon-Fri between 8AM-8PM.   . Maintain a healthy weight. The body mass index (BMI) is a screening tool to identify possible weight problems. It provides an estimate of body fat based on height and weight. Your health care provider can find your BMI and can help you achieve or maintain a healthy weight.   For adults 20 years and older: ? A BMI below 18.5 is considered underweight. ? A BMI of 18.5 to 24.9 is normal. ? A BMI of 25 to 29.9 is considered overweight. ? A BMI of 30 and above is considered obese.   . Maintain normal blood lipids and cholesterol levels by exercising and minimizing your intake of saturated fat. Eat a balanced diet with plenty of fruit and vegetables. Blood tests for lipids and cholesterol should begin at age 23 and be repeated every 5  years. If your lipid or cholesterol levels are high, you are over 50, or you are at high risk for heart disease, you may need your cholesterol levels checked more frequently. Ongoing high lipid and cholesterol levels should be treated with medicines if diet and exercise are not working.  . If you smoke, find out from your health care provider how to quit. If you do not use tobacco, please do not start.  . If you choose to drink alcohol, please do not consume more than 2 drinks per day. One drink is considered to be 12 ounces (355 mL) of beer, 5 ounces (148 mL) of  wine, or 1.5 ounces (44 mL) of liquor.  . If you are 58-25 years old, ask your health care provider if you should take aspirin to prevent strokes.  . Use sunscreen. Apply sunscreen liberally and repeatedly throughout the day. You should seek shade when your shadow is shorter than you. Protect yourself by wearing long sleeves, pants, a wide-brimmed hat, and sunglasses year round, whenever you are outdoors.  . Once a month, do a whole body skin exam, using a mirror to look at the skin on your back. Tell your health care provider of new moles, moles that have irregular borders, moles that are larger than a pencil eraser, or moles that have changed in shape or color.

## 2018-02-08 NOTE — Progress Notes (Signed)
PCP notes:   Health maintenance:  Colonoscopy - PCP follow-up requested Tetanus vaccine - postponed/insurance  Abnormal screenings:   None  Patient concerns:   None  Nurse concerns:  None  Next PCP appt:   02/15/18 @ 1020

## 2018-02-08 NOTE — Progress Notes (Signed)
Subjective:   Shane Kim is a 72 y.o. male who presents for Medicare Annual/Subsequent preventive examination.  Review of Systems:  N/A Cardiac Risk Factors include: advanced age (>79men, >91 women);male gender;dyslipidemia;obesity (BMI >30kg/m2)     Objective:    Vitals: BP 122/80 (BP Location: Right Arm, Patient Position: Sitting, Cuff Size: Normal)   Pulse 74   Temp 98.6 F (37 C) (Oral)   Ht 6\' 1"  (1.854 m) Comment: shoes  Wt 236 lb 8 oz (107.3 kg)   SpO2 96%   BMI 31.20 kg/m   Body mass index is 31.2 kg/m.  Advanced Directives 02/08/2018 02/05/2017 11/22/2015 11/21/2015  Does Patient Have a Medical Advance Directive? No No No No  Would patient like information on creating a medical advance directive? No - Patient declined No - Patient declined No - patient declined information No - patient declined information    Tobacco Social History   Tobacco Use  Smoking Status Never Smoker  Smokeless Tobacco Never Used     Counseling given: No   Clinical Intake:  Pre-visit preparation completed: Yes  Pain : No/denies pain Pain Score: 0-No pain     Nutritional Status: BMI > 30  Obese Nutritional Risks: None Diabetes: No  How often do you need to have someone help you when you read instructions, pamphlets, or other written materials from your doctor or pharmacy?: 1 - Never What is the last grade level you completed in school?: 12th grade + vocational courses  Interpreter Needed?: No  Comments: pt lives with spouse Information entered by :: LPinson, LPN  Past Medical History:  Diagnosis Date  . Acute systolic CHF (congestive heart failure) ([place])    a. echo 11/21/15: EF <20%, mildly dilated LV, severe diffuse global HK, GR3DD, mod MR, LA mildly dilated, RV sys fxn mildly reduced, PASP 52 mmHg  . CAD (coronary artery disease)    a. cardiac cath 11/22/15: dLM 40%, pLAD 80-90%, mLAD sequential 40%, D2 90%, mLCx 90% at takeoff of OM1, RCA appeared occluded  proximally, LVEF 20%, PCWP 29   . Chickenpox   . Chronic diastolic CHF (congestive heart failure) ([place])   . HLD (hyperlipidemia)   . Prostate cancer [facility])    Past Surgical History:  Procedure Laterality Date  . CARDIAC CATHETERIZATION N/A 11/22/2015   Procedure: Right/Left Heart Cath and Coronary Angiography;  Surgeon: Minna Merritts, MD;  Location: [place] [facility];  Service: Cardiovascular;  Laterality: N/A;  . CARDIAC CATHETERIZATION Left 11/29/2015   Procedure: LEFT FEMORAL ARTERIAL LINE INSERTION;  Surgeon: Gaye Pollack, MD;  Location: [place];  Service: Open Heart Surgery;  Laterality: Left;  . CORONARY ARTERY BYPASS GRAFT N/A 11/29/2015   Procedure: CORONARY ARTERY BYPASS GRAFTING (CABG) x four, using left internal mammary artery and right leg saphenous vein harvested endoscopically;  Surgeon: Gaye Pollack, MD;  Location: [place] [facility];  Service: Open Heart Surgery;  Laterality: N/A;  . TEE WITHOUT CARDIOVERSION N/A 11/29/2015   Procedure: TRANSESOPHAGEAL ECHOCARDIOGRAM (TEE);  Surgeon: Gaye Pollack, MD;  Location: [place];  Service: Open Heart Surgery;  Laterality: N/A;  . TURP VAPORIZATION     Family History  Problem Relation Age of Onset  . Hypertension Mother   . Healthy Father    Social History   Socioeconomic History  . Marital status: Married    Spouse name: Not on file  . Number of children: Not on file  . Years of education: Not on file  . Highest education level:  Not on file  Occupational History  . Not on file  Social Needs  . Financial resource strain: Not on file  . Food insecurity:    Worry: Not on file    Inability: Not on file  . Transportation needs:    Medical: Not on file    Non-medical: Not on file  Tobacco Use  . Smoking status: Never Smoker  . Smokeless tobacco: Never Used  Substance and Sexual Activity  . Alcohol use: No  . Drug use: No  . Sexual activity: Not on file  Lifestyle  . Physical activity:    Days per week: Not on file    Minutes  per session: Not on file  . Stress: Not on file  Relationships  . Social connections:    Talks on phone: Not on file    Gets together: Not on file    Attends religious service: Not on file    Active member of club or organization: Not on file    Attends meetings of clubs or organizations: Not on file    Relationship status: Not on file  Other Topics Concern  . Not on file  Social History Narrative   Married. Lives with his wife.   2 children, 2 grandchildren.   Retired. Worked as a Furniture conservator/restorer.   Enjoys fishing.     Outpatient Encounter Medications as of 02/08/2018  Medication Sig  . aspirin (RA ASPIRIN EC ADULT LOW ST) 81 MG EC tablet Take 1 tablet (81 mg total) by mouth daily. Swallow whole.  . carvedilol (COREG) 6.25 MG tablet Take 1.5 tablets (9.375 mg total) by mouth 2 (two) times daily with a meal. Last refill w/o OV [phone]  . finasteride (PROSCAR) 5 MG tablet TAKE 1 TABLET BY MOUTH ONCE DAILY  . losartan (COZAAR) 25 MG tablet Take 1 tablet (25 mg total) by mouth daily.  . rosuvastatin (CRESTOR) 20 MG tablet TAKE 1 TABLET BY MOUTH EVERY NIGHT AT BEDTIME  . spironolactone (ALDACTONE) 25 MG tablet TAKE 1 TABLET BY MOUTH ONCE DAILY   No facility-administered encounter medications on file as of 02/08/2018.     Activities of Daily Living In your present state of health, do you have any difficulty performing the following activities: 02/08/2018  Hearing? N  Vision? N  Difficulty concentrating or making decisions? N  Walking or climbing stairs? N  Dressing or bathing? N  Doing errands, shopping? N  Preparing Food and eating ? N  Using the Toilet? N  In the past six months, have you accidently leaked urine? N  Do you have problems with loss of bowel control? N  Managing your Medications? N  Managing your Finances? N  Housekeeping or managing your Housekeeping? N  Some recent data might be hidden    Patient Care Team: Pleas Koch, NP as PCP - General  (Internal Medicine)   Assessment:   This is a routine wellness examination for Shane Kim.   Hearing Screening   125Hz  250Hz  500Hz  1000Hz  2000Hz  3000Hz  4000Hz  6000Hz  8000Hz   Right ear:   40 40 40  40    Left ear:   40 40 40  40      Visual Acuity Screening   Right eye Left eye Both eyes  Without correction: 20/70 20/25-1 20/25-2  With correction:        Exercise Activities and Dietary recommendations Current Exercise Habits: The patient does not participate in regular exercise at present, Exercise limited by: None identified  Goals    .  DIET - INCREASE WATER INTAKE     Starting 02/08/2018, I will continue to drink at least 1 gallon of water daily.        Fall Risk Fall Risk  02/08/2018 02/05/2017  Falls in the past year? 0 No   Depression Screen PHQ 2/9 Scores 02/08/2018 02/05/2017  PHQ - 2 Score 0 0  PHQ- 9 Score 0 0    Cognitive Function MMSE - Mini Mental State Exam 02/08/2018 02/05/2017  Orientation to time 5 5  Orientation to Place 5 5  Registration 3 3  Attention/ Calculation 0 0  Recall 3 3  Language- name 2 objects 0 0  Language- repeat 1 1  Language- follow 3 step command 3 3  Language- read & follow direction 0 0  Write a sentence 0 0  Copy design 0 0  Total score 20 20        Immunization History  Administered Date(s) Administered  . Influenza, High Dose Seasonal PF 10/29/2015  . Influenza-Unspecified 10/24/2016, 12/29/2017  . PPD Test 01/21/2017  . Pneumococcal Conjugate-13 10/29/2015  . Pneumococcal Polysaccharide-23 02/05/2017    Screening Tests Health Maintenance  Topic Date Due  . COLONOSCOPY  02/23/2018 (Originally 07/04/1993)  . TETANUS/TDAP  02/24/2019 (Originally 07/05/1962)  . INFLUENZA VACCINE  Completed  . Hepatitis C Screening  Completed  . PNA vac Low Risk Adult  Completed       Plan:   I have personally reviewed, addressed, and noted the following in the patient's chart:  A. Medical and social history B. Use of alcohol,  tobacco or illicit drugs  C. Current medications and supplements D. Functional ability and status E.  Nutritional status F.  Physical activity G. Advance directives H. List of other physicians I.  Hospitalizations, surgeries, and ER visits in previous 12 months J.  [place] to include hearing, vision, cognitive, depression L. Referrals and appointments - none  In addition, I have reviewed and discussed with patient certain preventive protocols, quality metrics, and best practice recommendations. A written personalized care plan for preventive services as well as general preventive health recommendations were provided to patient.  See attached scanned questionnaire for additional information.   Signed,   Lindell Noe, MHA, BS, LPN Health Coach

## 2018-02-15 ENCOUNTER — Ambulatory Visit (INDEPENDENT_AMBULATORY_CARE_PROVIDER_SITE_OTHER): Payer: Medicare PPO | Admitting: Primary Care

## 2018-02-15 VITALS — BP 118/82 | HR 80 | Temp 99.0°F | Ht 71.5 in | Wt 237.8 lb

## 2018-02-15 DIAGNOSIS — Z0000 Encounter for general adult medical examination without abnormal findings: Secondary | ICD-10-CM

## 2018-02-15 DIAGNOSIS — R7303 Prediabetes: Secondary | ICD-10-CM

## 2018-02-15 DIAGNOSIS — I5022 Chronic systolic (congestive) heart failure: Secondary | ICD-10-CM

## 2018-02-15 DIAGNOSIS — C61 Malignant neoplasm of prostate: Secondary | ICD-10-CM

## 2018-02-15 DIAGNOSIS — Z1211 Encounter for screening for malignant neoplasm of colon: Secondary | ICD-10-CM

## 2018-02-15 DIAGNOSIS — E785 Hyperlipidemia, unspecified: Secondary | ICD-10-CM | POA: Diagnosis not present

## 2018-02-15 DIAGNOSIS — I25119 Atherosclerotic heart disease of native coronary artery with unspecified angina pectoris: Secondary | ICD-10-CM

## 2018-02-15 MED ORDER — ROSUVASTATIN CALCIUM 40 MG PO TABS: 40.0000 mg | ORAL_TABLET | Freq: Every day | ORAL | 3 refills | Status: DC

## 2018-02-15 NOTE — Assessment & Plan Note (Addendum)
Echocardiogram in 2018 with EF of 35-40% which was an improvement from prior. Appears euvolemic today. Continue spironolactone and carvedilol.  Recommended to schedule a follow up visit with cardiologist.

## 2018-02-15 NOTE — Assessment & Plan Note (Signed)
Recent PSA of 2.01. continue to monitor.

## 2018-02-15 NOTE — Assessment & Plan Note (Signed)
LDL above goal in the setting of CAD. Increase rosuvastatin to 40 mg daily. Repeat lipids in 6 weeks.

## 2018-02-15 NOTE — Progress Notes (Signed)
Subjective:    Patient ID: Shane Kim, male    DOB: [birth year], 72 y.o.   MRN: [id number]  HPI  Shane Kim is a 72 year old male who presents today for complete physical.  Immunizations: -Influenza: Completed this season -Pneumonia: Completed Prevnar in 2018, Pneumovax in 2018 -Shingles: Declines   Diet:  He endorses a fair diet Breakfast: Eggs, oatmeal Lunch: Chicken, fish, steak, vegetables, starch Dinner: Meat, vegetables, starch Snacks: Hot dog Desserts: 1-2 times monthly Beverages: Soda, some sweet tea, water  Exercise: He is not exercising  Eye exam: Completed in 2019 Dental exam: No recent exam Colonoscopy: Unsure of last test PSA: 2.01 in 2019 Hep C Screen:  Negative in 2018  BP Readings from Last 3 Encounters:  02/15/18 118/82  02/08/18 122/80  08/14/17 118/72   The 10-year ASCVD risk score Mikey Bussing DC Jr., et al., 2013) is: 11.2%   Values used to calculate the score:     Age: 50 years     Sex: Male     Is Non-Hispanic African American: Yes     Diabetic: No     Tobacco smoker: No     Systolic Blood Pressure: 413 mmHg     Is BP treated: No     HDL Cholesterol: 57 mg/dL     Total Cholesterol: 195 mg/dL    Review of Systems  Constitutional: Positive for fever. Negative for unexpected weight change.       Low grade fever today  HENT: Negative for rhinorrhea.   Respiratory: Positive for cough. Negative for shortness of breath.        Acute cough x 3 days  Cardiovascular: Negative for chest pain.  Gastrointestinal: Negative for constipation and diarrhea.  Genitourinary: Negative for difficulty urinating.  Musculoskeletal: Negative for arthralgias.  Skin: Negative for rash.  Allergic/Immunologic: Negative for environmental allergies.  Neurological: Negative for dizziness, numbness and headaches.  Psychiatric/Behavioral: The patient is not nervous/anxious.        Past Medical History:  Diagnosis Date  . Acute systolic CHF (congestive heart  failure) ([place])    a. echo 11/21/15: EF <20%, mildly dilated LV, severe diffuse global HK, GR3DD, mod MR, LA mildly dilated, RV sys fxn mildly reduced, PASP 52 mmHg  . CAD (coronary artery disease)    a. cardiac cath 11/22/15: dLM 40%, pLAD 80-90%, mLAD sequential 40%, D2 90%, mLCx 90% at takeoff of OM1, RCA appeared occluded proximally, LVEF 20%, PCWP 29   . Chickenpox   . Chronic diastolic CHF (congestive heart failure) ([place])   . HLD (hyperlipidemia)   . Prostate cancer [facility])      Social History   Socioeconomic History  . Marital status: Married    Spouse name: Not on file  . Number of children: Not on file  . Years of education: Not on file  . Highest education level: Not on file  Occupational History  . Not on file  Social Needs  . Financial resource strain: Not on file  . Food insecurity:    Worry: Not on file    Inability: Not on file  . Transportation needs:    Medical: Not on file    Non-medical: Not on file  Tobacco Use  . Smoking status: Never Smoker  . Smokeless tobacco: Never Used  Substance and Sexual Activity  . Alcohol use: No  . Drug use: No  . Sexual activity: Not on file  Lifestyle  . Physical activity:    Days per week: Not on  file    Minutes per session: Not on file  . Stress: Not on file  Relationships  . Social connections:    Talks on phone: Not on file    Gets together: Not on file    Attends religious service: Not on file    Active member of club or organization: Not on file    Attends meetings of clubs or organizations: Not on file    Relationship status: Not on file  . Intimate partner violence:    Fear of current or ex partner: Not on file    Emotionally abused: Not on file    Physically abused: Not on file    Forced sexual activity: Not on file  Other Topics Concern  . Not on file  Social History Narrative   Married. Lives with his wife.   2 children, 2 grandchildren.   Retired. Worked as a Furniture conservator/restorer.   Enjoys fishing.     Past  Surgical History:  Procedure Laterality Date  . CARDIAC CATHETERIZATION N/A 11/22/2015   Procedure: Right/Left Heart Cath and Coronary Angiography;  Surgeon: Minna Merritts, MD;  Location: [place] [facility];  Service: Cardiovascular;  Laterality: N/A;  . CARDIAC CATHETERIZATION Left 11/29/2015   Procedure: LEFT FEMORAL ARTERIAL LINE INSERTION;  Surgeon: Gaye Pollack, MD;  Location: [place];  Service: Open Heart Surgery;  Laterality: Left;  . CORONARY ARTERY BYPASS GRAFT N/A 11/29/2015   Procedure: CORONARY ARTERY BYPASS GRAFTING (CABG) x four, using left internal mammary artery and right leg saphenous vein harvested endoscopically;  Surgeon: Gaye Pollack, MD;  Location: [place] [facility];  Service: Open Heart Surgery;  Laterality: N/A;  . TEE WITHOUT CARDIOVERSION N/A 11/29/2015   Procedure: TRANSESOPHAGEAL ECHOCARDIOGRAM (TEE);  Surgeon: Gaye Pollack, MD;  Location: [place];  Service: Open Heart Surgery;  Laterality: N/A;  . TURP VAPORIZATION      Family History  Problem Relation Age of Onset  . Hypertension Mother   . Healthy Father     No Known Allergies  Current Outpatient Medications on File Prior to Visit  Medication Sig Dispense Refill  . aspirin (RA ASPIRIN EC ADULT LOW ST) 81 MG EC tablet Take 1 tablet (81 mg total) by mouth daily. Swallow whole. 90 tablet 3  . carvedilol (COREG) 6.25 MG tablet Take 1.5 tablets (9.375 mg total) by mouth 2 (two) times daily with a meal. Last refill w/o OV [phone] 270 tablet 0  . finasteride (PROSCAR) 5 MG tablet TAKE 1 TABLET BY MOUTH ONCE DAILY 30 tablet 0  . losartan (COZAAR) 25 MG tablet Take 1 tablet (25 mg total) by mouth daily. 30 tablet 6  . spironolactone (ALDACTONE) 25 MG tablet TAKE 1 TABLET BY MOUTH ONCE DAILY 90 tablet 0   No current facility-administered medications on file prior to visit.     BP 118/82   Pulse 80   Temp 99 F (37.2 C) (Oral)   Ht 5' 11.5" (1.816 m)   Wt 237 lb 12 oz (107.8 kg)   SpO2 94%   BMI 32.70 kg/m     Objective:   Physical Exam  Constitutional: He is oriented to person, place, and time. He appears well-nourished.  HENT:  Mouth/Throat: No oropharyngeal exudate.  Eyes: Pupils are equal, round, and reactive to light. EOM are normal.  Neck: Neck supple. No thyromegaly present.  Cardiovascular: Normal rate and regular rhythm.  Respiratory: Effort normal and breath sounds normal.  GI: Soft. Bowel sounds are normal. There is no  abdominal tenderness.  Musculoskeletal: Normal range of motion.  Neurological: He is alert and oriented to person, place, and time.  Skin: Skin is warm and dry.  Psychiatric: He has a normal mood and affect.           Assessment & Plan:

## 2018-02-15 NOTE — Patient Instructions (Addendum)
You will be contacted regarding your referral to GI for the colonoscopy.  Please let us know if you have not been contacted within one week.   We've increased the dose of your rosuvastatin from 20 mg to 40 mg. I sent a new prescription to your pharmacy.  Schedule a follow up visit with your heart doctor. You saw Dr. Haroldine Laws last year.   Start exercising. You should be getting 150 minutes of exercise weekly.  Limit sodas, sugar drinks, fast/fried food. This will help to prevent diabetes.  Ensure you are consuming 64 ounces of water daily.  Schedule a lab only appointment for 6 weeks to repeat cholesterol.  It was a pleasure to see you today!

## 2018-02-15 NOTE — Assessment & Plan Note (Addendum)
A1C about the same when compared to Summer 2019. Recommend to stop soda consumption, start exercising. Continue to monitor.

## 2018-02-15 NOTE — Assessment & Plan Note (Signed)
Immunizations UTD. Declines shingles vaccination. PSA UTD. Colonoscopy overdue, referral to GI placed. Recommended regular exercise, healthy diet.  Exam unremarkable. Labs reviewed.  Follow up in 1 year for CPE.

## 2018-02-15 NOTE — Assessment & Plan Note (Signed)
Asymptomatic. LDL above goal at 123. Increase rosuvastatin to 40 mg. Repeat lipids in 6 weeks. Follow up with cardiology as recommended.

## 2018-02-21 ENCOUNTER — Other Ambulatory Visit (HOSPITAL_COMMUNITY): Payer: Self-pay | Admitting: Internal Medicine

## 2018-02-22 ENCOUNTER — Other Ambulatory Visit (HOSPITAL_COMMUNITY): Payer: Self-pay | Admitting: Internal Medicine

## 2018-02-25 ENCOUNTER — Other Ambulatory Visit (HOSPITAL_COMMUNITY): Payer: Self-pay | Admitting: Internal Medicine

## 2018-03-08 ENCOUNTER — Telehealth: Payer: Self-pay

## 2018-03-08 NOTE — Telephone Encounter (Signed)
Shane Kim said [facility] called to schedule colonoscopy and Shane Kim did not schedule because pt usually has colonoscopy in [facility] and request appt for colonoscopy at [facility].

## 2018-03-08 NOTE — Telephone Encounter (Signed)
Referral request for Colonoscopy faxed top [place] and patients wife notified.

## 2018-03-12 DIAGNOSIS — D72819 Decreased white blood cell count, unspecified: Secondary | ICD-10-CM | POA: Insufficient documentation

## 2018-03-12 DIAGNOSIS — Z86010 Personal history of colonic polyps: Secondary | ICD-10-CM | POA: Insufficient documentation

## 2018-03-12 DIAGNOSIS — D649 Anemia, unspecified: Secondary | ICD-10-CM | POA: Insufficient documentation

## 2018-03-18 ENCOUNTER — Telehealth: Payer: Self-pay | Admitting: Primary Care

## 2018-03-18 DIAGNOSIS — I420 Dilated cardiomyopathy: Secondary | ICD-10-CM

## 2018-03-18 DIAGNOSIS — C61 Malignant neoplasm of prostate: Secondary | ICD-10-CM

## 2018-03-18 MED ORDER — FINASTERIDE 5 MG PO TABS: 5.0000 mg | ORAL_TABLET | Freq: Every day | ORAL | 3 refills | Status: DC

## 2018-03-18 MED ORDER — LOSARTAN POTASSIUM 25 MG PO TABS: 25.0000 mg | ORAL_TABLET | Freq: Every day | ORAL | 3 refills | Status: DC

## 2018-03-18 MED ORDER — SPIRONOLACTONE 25 MG PO TABS: 25.0000 mg | ORAL_TABLET | Freq: Every day | ORAL | 0 refills | Status: DC

## 2018-03-18 NOTE — Telephone Encounter (Signed)
Last seen on 02/15/2018 for Part 2 AWV.

## 2018-03-18 NOTE — Telephone Encounter (Signed)
Please notify patient that I sent refills. He will need to see his cardiologist in March as scheduled for further refills of his spironolactone. He is also due for repeat labs as scheduled in early February, i'll be in touch once I receive results.

## 2018-03-18 NOTE — Telephone Encounter (Signed)
Spoken and notified patient's spouse of Kate [name]'s comments. Patient's spouse verbalized understanding.  

## 2018-03-18 NOTE — Telephone Encounter (Signed)
Pt walked in needing to get a refill on  Losartan 25mg  tablets Take 1 tablet by mouth once daily  Finasteride 5mg  tablets Take 1 tablet by mouth every day  Spironolactone 25mg  tablet Take 1 tablet by mouth every day  [facility] [address] [place] number for pt [phone] or   Spouse number (brenda) [phone]  Pt is completely out of his meds He stated he has been out for weeks  Pt stated he called Dr Haroldine Laws office to get this refilled they stated pt PCP was authorized to call in refills for him.

## 2018-03-18 NOTE — Telephone Encounter (Signed)
Please call pt when this has been called in

## 2018-03-28 ENCOUNTER — Other Ambulatory Visit: Payer: Self-pay | Admitting: Primary Care

## 2018-03-28 DIAGNOSIS — E785 Hyperlipidemia, unspecified: Secondary | ICD-10-CM

## 2018-03-29 ENCOUNTER — Telehealth: Payer: Self-pay | Admitting: Primary Care

## 2018-03-29 ENCOUNTER — Other Ambulatory Visit (INDEPENDENT_AMBULATORY_CARE_PROVIDER_SITE_OTHER): Payer: Medicare PPO

## 2018-03-29 DIAGNOSIS — E785 Hyperlipidemia, unspecified: Secondary | ICD-10-CM

## 2018-03-29 DIAGNOSIS — I5022 Chronic systolic (congestive) heart failure: Secondary | ICD-10-CM

## 2018-03-29 LAB — LIPID PANEL
Cholesterol: 172 mg/dL (ref 0–200)
HDL: 46.9 mg/dL (ref >39.00)
LDL Cholesterol: 105 mg/dL — ABNORMAL HIGH (ref 0–99)
NonHDL: 124.92
Total CHOL/HDL Ratio: 4
Triglycerides: 98 mg/dL (ref 0.0–149.0)
VLDL: 19.6 mg/dL (ref 0.0–40.0)

## 2018-03-29 LAB — HEPATIC FUNCTION PANEL
ALT: 12 U/L (ref 0–53)
AST: 15 U/L (ref 0–37)
Albumin: 4.2 g/dL (ref 3.5–5.2)
Alkaline Phosphatase: 48 U/L (ref 39–117)
Bilirubin, Direct: 0.1 mg/dL (ref 0.0–0.3)
Total Bilirubin: 0.7 mg/dL (ref 0.2–1.2)
Total Protein: 7.7 g/dL (ref 6.0–8.3)

## 2018-03-29 MED ORDER — CARVEDILOL 6.25 MG PO TABS: 9.3750 mg | ORAL_TABLET | Freq: Two times a day (BID) | ORAL | 0 refills | Status: DC

## 2018-03-29 NOTE — Telephone Encounter (Signed)
Last seen on 02/05/2018

## 2018-03-29 NOTE — Telephone Encounter (Signed)
Pt came in today for labs and needs to get a refill on carvedilol 6.25mg  tab Take 1.5 tablet by mouth twice daily with food.   The was prescribed by dr  Quillian Quince bensimhon   [facility] [address] [place]  On bottle is stated last refill with out office visit  [facility] number [phone]

## 2018-03-29 NOTE — Telephone Encounter (Signed)
Noted, refill sent to pharmacy. Patient has follow up visit scheduled with cardiology in March.

## 2018-03-30 ENCOUNTER — Encounter: Payer: Self-pay | Admitting: *Deleted

## 2018-04-26 ENCOUNTER — Other Ambulatory Visit (HOSPITAL_COMMUNITY): Payer: Self-pay

## 2018-04-26 ENCOUNTER — Other Ambulatory Visit: Payer: Self-pay | Admitting: Primary Care

## 2018-04-26 DIAGNOSIS — I5022 Chronic systolic (congestive) heart failure: Secondary | ICD-10-CM

## 2018-04-26 NOTE — Telephone Encounter (Signed)
Noted, patient will be seeing cardiology later this week. Refill sent to pharmacy.

## 2018-04-26 NOTE — Telephone Encounter (Signed)
Last prescribed on 03/29/2018 . Last office visit on 02/15/2018. Next future appointment on 02/14/2019

## 2018-04-29 ENCOUNTER — Ambulatory Visit (HOSPITAL_COMMUNITY)
Admission: RE | Admit: 2018-04-29 | Discharge: 2018-04-29 | Disposition: A | Discharge status: Home / Self Care | Payer: Medicare PPO | Source: Ambulatory Visit | Attending: Internal Medicine | Admitting: Internal Medicine

## 2018-04-29 ENCOUNTER — Encounter (HOSPITAL_COMMUNITY): Payer: Self-pay | Admitting: Internal Medicine

## 2018-04-29 ENCOUNTER — Ambulatory Visit (HOSPITAL_BASED_OUTPATIENT_CLINIC_OR_DEPARTMENT_OTHER)
Admission: RE | Admit: 2018-04-29 | Discharge: 2018-04-29 | Disposition: A | Discharge status: Home / Self Care | Payer: Medicare PPO | Source: Ambulatory Visit

## 2018-04-29 VITALS — BP 116/86 | HR 77 | Wt 230.0 lb

## 2018-04-29 DIAGNOSIS — I2510 Atherosclerotic heart disease of native coronary artery without angina pectoris: Secondary | ICD-10-CM

## 2018-04-29 DIAGNOSIS — E785 Hyperlipidemia, unspecified: Secondary | ICD-10-CM | POA: Insufficient documentation

## 2018-04-29 DIAGNOSIS — Z951 Presence of aortocoronary bypass graft: Secondary | ICD-10-CM

## 2018-04-29 DIAGNOSIS — I071 Rheumatic tricuspid insufficiency: Secondary | ICD-10-CM | POA: Insufficient documentation

## 2018-04-29 DIAGNOSIS — I371 Nonrheumatic pulmonary valve insufficiency: Secondary | ICD-10-CM | POA: Insufficient documentation

## 2018-04-29 DIAGNOSIS — I5022 Chronic systolic (congestive) heart failure: Secondary | ICD-10-CM

## 2018-04-29 DIAGNOSIS — I25810 Atherosclerosis of coronary artery bypass graft(s) without angina pectoris: Secondary | ICD-10-CM | POA: Diagnosis not present

## 2018-04-29 DIAGNOSIS — Z8546 Personal history of malignant neoplasm of prostate: Secondary | ICD-10-CM | POA: Diagnosis not present

## 2018-04-29 DIAGNOSIS — Z7982 Long term (current) use of aspirin: Secondary | ICD-10-CM | POA: Insufficient documentation

## 2018-04-29 DIAGNOSIS — Z8249 Family history of ischemic heart disease and other diseases of the circulatory system: Secondary | ICD-10-CM | POA: Insufficient documentation

## 2018-04-29 DIAGNOSIS — I5042 Chronic combined systolic (congestive) and diastolic (congestive) heart failure: Secondary | ICD-10-CM | POA: Insufficient documentation

## 2018-04-29 DIAGNOSIS — Z79899 Other long term (current) drug therapy: Secondary | ICD-10-CM | POA: Insufficient documentation

## 2018-04-29 NOTE — Addendum Note (Signed)
Encounter addended by: Jovita Kussmaul, RN on: 04/29/2018 10:43 AM  Actions taken: Clinical Note Signed

## 2018-04-29 NOTE — Patient Instructions (Signed)
Your physician wants you to follow-up in: [place] will receive a reminder letter in the mail two months in advance. If you don't receive a letter, please call our office to schedule the follow-up appointment. You can call in December/January to call and schedule this appointment as well.

## 2018-04-29 NOTE — Progress Notes (Signed)
  Echocardiogram 2D Echocardiogram has been performed.  Shane Kim 04/29/2018, 9:52 AM

## 2018-04-29 NOTE — Progress Notes (Signed)
ADVANCED HF CLINIC NOTE   HPI:  Shane Kim is a 73 y/o male with history of hyperlipidemia and prostate cancer who presented to [facility] in 10/17 with HF.  An echo showed severe LV dysfunction with an EF <20%, moderate MR, a dilated LV, mild RV systolic dysfunction, PASP elevated at 52 mm Hg. Cardiac cath showed severe 3-vessel coronary artery disease with 90% proximal LAD, occlusion of a large OM with collaterals filling the distal vessel and an occluded proximal RCA with collaterals filling the PDA. LVEF was 20% with elevated pulmonary artery pressures and a PCWP of 29. He was transferred to [facility] for further care.   Underwent CABG 11/29/15 with Dr. Cyndia Bent LIMA to LAD, Sequential SVG to second diagonal and OM, SVG to RCA. Post op did well. Had persistent sinus tach so ivabradine added to standard HF regimen with good result.   He presents today for HF follow up. Feels great. No SOB. Still working as a crossing guard. No CP or SOB. Walks 2-3 miles several times per week.as long as the weather is good. No problems with medicines. Recently had blood work and LDL was 105. SO Crestor was increased.   Echo today EF 45-50%  Personally reviewed   Echo 02/2016 EF 20-25% Echo 06/2016 EF 35-40%.  Past Medical History:  Diagnosis Date  . Acute systolic CHF (congestive heart failure) ([place])    a. echo 11/21/15: EF <20%, mildly dilated LV, severe diffuse global HK, GR3DD, mod MR, LA mildly dilated, RV sys fxn mildly reduced, PASP 52 mmHg  . CAD (coronary artery disease)    a. cardiac cath 11/22/15: dLM 40%, pLAD 80-90%, mLAD sequential 40%, D2 90%, mLCx 90% at takeoff of OM1, RCA appeared occluded proximally, LVEF 20%, PCWP 29   . Chickenpox   . Chronic diastolic CHF (congestive heart failure) ([place])   . HLD (hyperlipidemia)   . Prostate cancer [facility])     Current Outpatient Medications  Medication Sig Dispense Refill  . aspirin (RA ASPIRIN EC ADULT LOW ST) 81 MG EC tablet Take 1 tablet (81 mg total) by  mouth daily. Swallow whole. 90 tablet 3  . carvedilol (COREG) 6.25 MG tablet TAKE 1 AND 1/2 TABLETS(9.375 MG) BY MOUTH TWICE DAILY WITH A MEAL 90 tablet 0  . finasteride (PROSCAR) 5 MG tablet Take 1 tablet (5 mg total) by mouth daily. 90 tablet 3  . losartan (COZAAR) 25 MG tablet Take 1 tablet (25 mg total) by mouth daily. 90 tablet 3  . rosuvastatin (CRESTOR) 40 MG tablet Take 1 tablet (40 mg total) by mouth daily. For cholesterol. 90 tablet 3  . spironolactone (ALDACTONE) 25 MG tablet Take 1 tablet (25 mg total) by mouth daily. 90 tablet 0   No current facility-administered medications for this encounter.     No Known Allergies    Social History   Socioeconomic History  . Marital status: Married    Spouse name: Not on file  . Number of children: Not on file  . Years of education: Not on file  . Highest education level: Not on file  Occupational History  . Not on file  Social Needs  . Financial resource strain: Not on file  . Food insecurity:    Worry: Not on file    Inability: Not on file  . Transportation needs:    Medical: Not on file    Non-medical: Not on file  Tobacco Use  . Smoking status: Never Smoker  . Smokeless tobacco: Never Used  Substance and Sexual Activity  . Alcohol use: No  . Drug use: No  . Sexual activity: Not on file  Lifestyle  . Physical activity:    Days per week: Not on file    Minutes per session: Not on file  . Stress: Not on file  Relationships  . Social connections:    Talks on phone: Not on file    Gets together: Not on file    Attends religious service: Not on file    Active member of club or organization: Not on file    Attends meetings of clubs or organizations: Not on file    Relationship status: Not on file  . Intimate partner violence:    Fear of current or ex partner: Not on file    Emotionally abused: Not on file    Physically abused: Not on file    Forced sexual activity: Not on file  Other Topics Concern  . Not on file    Social History Narrative   Married. Lives with his wife.   2 children, 2 grandchildren.   Retired. Worked as a Furniture conservator/restorer.   Enjoys fishing.       Family History  Problem Relation Age of Onset  . Hypertension Mother   . Healthy Father     Vitals:   04/29/18 1015  BP: 116/86  Pulse: 77  SpO2: 96%  Weight: 104.3 kg (230 lb)   Wt Readings from Last 3 Encounters:  04/29/18 104.3 kg (230 lb)  02/15/18 107.8 kg (237 lb 12 oz)  02/08/18 107.3 kg (236 lb 8 oz)    Physical Exam: General:  Well appearing. No resp difficulty HEENT: normal Neck: supple. no JVD. Carotids 2+ bilat; no bruits. No lymphadenopathy or thryomegaly appreciated. Cor: PMI nondisplaced. Regular rate & rhythm. No rubs, gallops or murmurs. Lungs: clear Abdomen: soft, nontender, nondistended. No hepatosplenomegaly. No bruits or masses. Good bowel sounds. Extremities: no cyanosis, clubbing, rash, edema Neuro: alert & orientedx3, cranial nerves grossly intact. moves all 4 extremities w/o difficulty. Affect pleasant   ASSESSMENT & PLAN: 1. CAD - S/P CABG x4 on 11/29/15 - Doing very well. No s/s ischemia - Continue ASA and statin - Crestor recently increased. Goal LDL < 70  2. Chronic systolic HF due to iCM EF 20% . Echo 02/2016 with EF 20-25%. EF 06/2016 EF 35-40%.  - Echo today EF 45-50%. dbpr - NYHA I. Volume status looks good.  - On good meds  3. Hyperlipidemia - Continue Crestor. Followed by PCP - Crestor recently increased. Goal LDL < 70     Glori Bickers, MD 10:31 AM

## 2018-05-11 ENCOUNTER — Telehealth (HOSPITAL_COMMUNITY): Payer: Self-pay | Admitting: *Deleted

## 2018-05-11 NOTE — Telephone Encounter (Signed)
Received fax from [place], pt needs clearance to proceed with colonoscopy  Per Dr Haroldine Laws: "ok to proceed"  Note faxed back to them at [phone]

## 2018-05-28 NOTE — Telephone Encounter (Signed)
Clearance and last OV note re-faxed to [phone]

## 2018-06-17 ENCOUNTER — Other Ambulatory Visit: Payer: Self-pay | Admitting: Primary Care

## 2018-06-17 DIAGNOSIS — I420 Dilated cardiomyopathy: Secondary | ICD-10-CM

## 2018-06-18 NOTE — Telephone Encounter (Signed)
Per PCP, this refill suppose to go to cardiology.

## 2018-06-23 ENCOUNTER — Other Ambulatory Visit: Payer: Self-pay | Admitting: Internal Medicine

## 2018-06-23 DIAGNOSIS — I420 Dilated cardiomyopathy: Secondary | ICD-10-CM

## 2018-06-23 NOTE — Telephone Encounter (Signed)
Please send to cardiologist.

## 2018-06-23 NOTE — Telephone Encounter (Signed)
Christian at [facility] left a message stating that they have sent a request over several times for a refill on patient's spironolactone. Pharmacist stated that they have not heard anything back on this and wants a call back/

## 2018-06-23 NOTE — Telephone Encounter (Signed)
Will refuse. [facility] will send a new request to Dr Glori Bickers instead.

## 2018-06-29 ENCOUNTER — Other Ambulatory Visit: Payer: Self-pay | Admitting: Primary Care

## 2018-06-29 DIAGNOSIS — I5022 Chronic systolic (congestive) heart failure: Secondary | ICD-10-CM

## 2018-07-05 ENCOUNTER — Other Ambulatory Visit: Payer: Self-pay | Admitting: Internal Medicine

## 2018-07-05 ENCOUNTER — Telehealth: Payer: Self-pay | Admitting: Primary Care

## 2018-07-05 DIAGNOSIS — I5022 Chronic systolic (congestive) heart failure: Secondary | ICD-10-CM

## 2018-07-05 NOTE — Telephone Encounter (Signed)
Spoken to patient's spouse and inform her that the refill request as ben send to Dr Haroldine Laws. Patient's spouse verbalized understanding.

## 2018-07-05 NOTE — Telephone Encounter (Signed)
Pt came into office and said he called in refill for Carvedilol 6.25MG  on Thursday to [facility] in [place]. They told him they hadn't got a response from [place]. Pt will be out of pills by tomorrow and was wondering could this prescription be filled by then.  CB [phone]

## 2018-08-30 NOTE — Progress Notes (Signed)
I reviewed health advisor's note, was available for consultation, and agree with documentation and plan.  

## 2018-09-14 ENCOUNTER — Other Ambulatory Visit: Payer: Self-pay | Admitting: Internal Medicine

## 2018-09-14 DIAGNOSIS — I420 Dilated cardiomyopathy: Secondary | ICD-10-CM

## 2018-10-03 ENCOUNTER — Other Ambulatory Visit: Payer: Self-pay | Admitting: Internal Medicine

## 2018-10-03 DIAGNOSIS — I5022 Chronic systolic (congestive) heart failure: Secondary | ICD-10-CM

## 2018-10-17 ENCOUNTER — Other Ambulatory Visit (HOSPITAL_COMMUNITY): Payer: Self-pay | Admitting: Internal Medicine

## 2018-11-11 DIAGNOSIS — Z86010 Personal history of colonic polyps: Secondary | ICD-10-CM | POA: Diagnosis not present

## 2018-11-11 DIAGNOSIS — D649 Anemia, unspecified: Secondary | ICD-10-CM | POA: Diagnosis not present

## 2018-11-26 ENCOUNTER — Other Ambulatory Visit: Payer: Self-pay

## 2018-11-29 ENCOUNTER — Inpatient Hospital Stay: Payer: Medicare PPO | Attending: Oncology | Admitting: Oncology

## 2018-11-29 ENCOUNTER — Encounter (INDEPENDENT_AMBULATORY_CARE_PROVIDER_SITE_OTHER): Payer: Self-pay

## 2018-11-29 ENCOUNTER — Other Ambulatory Visit: Payer: Self-pay

## 2018-11-29 ENCOUNTER — Other Ambulatory Visit (HOSPITAL_COMMUNITY): Payer: Self-pay

## 2018-11-29 ENCOUNTER — Inpatient Hospital Stay: Payer: Medicare PPO

## 2018-11-29 ENCOUNTER — Encounter: Payer: Self-pay | Admitting: Oncology

## 2018-11-29 VITALS — BP 109/77 | HR 88 | Temp 96.3°F | Ht 74.0 in | Wt 227.0 lb

## 2018-11-29 DIAGNOSIS — D649 Anemia, unspecified: Secondary | ICD-10-CM

## 2018-11-29 DIAGNOSIS — I255 Ischemic cardiomyopathy: Secondary | ICD-10-CM | POA: Insufficient documentation

## 2018-11-29 DIAGNOSIS — D709 Neutropenia, unspecified: Secondary | ICD-10-CM

## 2018-11-29 DIAGNOSIS — I509 Heart failure, unspecified: Secondary | ICD-10-CM | POA: Diagnosis not present

## 2018-11-29 DIAGNOSIS — D472 Monoclonal gammopathy: Secondary | ICD-10-CM

## 2018-11-29 DIAGNOSIS — I420 Dilated cardiomyopathy: Secondary | ICD-10-CM

## 2018-11-29 LAB — CBC WITH DIFFERENTIAL/PLATELET
Abs Immature Granulocytes: 0 10*3/uL (ref 0.00–0.07)
Basophils Absolute: 0 10*3/uL (ref 0.0–0.1)
Basophils Relative: 1 %
Eosinophils Absolute: 0.1 10*3/uL (ref 0.0–0.5)
Eosinophils Relative: 5 %
HCT: 36.7 % — ABNORMAL LOW (ref 39.0–52.0)
Hemoglobin: 11.7 g/dL — ABNORMAL LOW (ref 13.0–17.0)
Immature Granulocytes: 0 %
Lymphocytes Relative: 31 %
Lymphs Abs: 0.9 10*3/uL (ref 0.7–4.0)
MCH: 31.2 pg (ref 26.0–34.0)
MCHC: 31.9 g/dL (ref 30.0–36.0)
MCV: 97.9 fL (ref 80.0–100.0)
Monocytes Absolute: 0.2 10*3/uL (ref 0.1–1.0)
Monocytes Relative: 6 %
Neutro Abs: 1.6 10*3/uL — ABNORMAL LOW (ref 1.7–7.7)
Neutrophils Relative %: 57 %
Platelets: 167 10*3/uL (ref 150–400)
RBC: 3.75 MIL/uL — ABNORMAL LOW (ref 4.22–5.81)
RDW: 12.7 % (ref 11.5–15.5)
WBC: 2.8 10*3/uL — ABNORMAL LOW (ref 4.0–10.5)
nRBC: 0 % (ref 0.0–0.2)

## 2018-11-29 LAB — FOLATE: Folate: 11 ng/mL (ref >5.9)

## 2018-11-29 LAB — IRON AND TIBC
Iron: 60 ug/dL (ref 45–182)
Saturation Ratios: 17 % — ABNORMAL LOW (ref 17.9–39.5)
TIBC: 349 ug/dL (ref 250–450)
UIBC: 289 ug/dL

## 2018-11-29 LAB — TECHNOLOGIST SMEAR REVIEW: Plt Morphology: ADEQUATE

## 2018-11-29 LAB — VITAMIN B12: Vitamin B-12: 449 pg/mL (ref 180–914)

## 2018-11-29 LAB — RETICULOCYTES
Immature Retic Fract: 7.7 % (ref 2.3–15.9)
RBC.: 3.75 MIL/uL — ABNORMAL LOW (ref 4.22–5.81)
Retic Count, Absolute: 36.4 10*3/uL (ref 19.0–186.0)
Retic Ct Pct: 1 % (ref 0.4–3.1)

## 2018-11-29 LAB — FERRITIN: Ferritin: 111 ng/mL (ref 24–336)

## 2018-11-29 MED ORDER — SPIRONOLACTONE 25 MG PO TABS: 25.0000 mg | ORAL_TABLET | Freq: Every day | ORAL | 0 refills | Status: DC

## 2018-11-29 NOTE — Progress Notes (Signed)
Hematology/Oncology Consult note [facility] Telephone:([phone] [phone]  Patient Care Team: Pleas Koch, NP as PCP - General (Internal Medicine)   Name of the patient: Shane Kim  ZI:[id number]  [birth year]    Reason for referral-anemia   Referring physician-Kim Jerelene Redden NP  Date of visit: 11/29/18   History of presenting illness-patient is a 73 year old African-American male with a past medical history significant for congestive heart failure, ischemic cardiomyopathy, hyperlipidemia, history of hidradenitis who has been referred to [place] for anemia.  Most recent labs from January 2020 when his white count was 2.7, H&H of 13.5/42.4 and a platelet count of 179.  ANC was mildly low at 1.2.  Iron studies normal ferritin of 89 and normal iron studies.  Folate was normal.  B12 was normal at 410.  Looking back at his prior CBCs patient has had chronic leukopenia and his white count fluctuates between 2.3 to normal.  He has always had mild to moderate neutropenia.  Overall he feels well and denies any complaints at this time.  His appetite is good and weight has remained stable.  Denies any aches and pains anywhere  ECOG PS- 0  Pain scale- 0   Review of systems- Review of Systems  Constitutional: Negative for chills, fever, malaise/fatigue and weight loss.  HENT: Negative for congestion, ear discharge and nosebleeds.   Eyes: Negative for blurred vision.  Respiratory: Negative for cough, hemoptysis, sputum production, shortness of breath and wheezing.   Cardiovascular: Negative for chest pain, palpitations, orthopnea and claudication.  Gastrointestinal: Negative for abdominal pain, blood in stool, constipation, diarrhea, heartburn, melena, nausea and vomiting.  Genitourinary: Negative for dysuria, flank pain, frequency, hematuria and urgency.  Musculoskeletal: Negative for back pain, joint pain and myalgias.  Skin: Negative for rash.   Neurological: Negative for dizziness, tingling, focal weakness, seizures, weakness and headaches.  Endo/Heme/Allergies: Does not bruise/bleed easily.  Psychiatric/Behavioral: Negative for depression and suicidal ideas. The patient does not have insomnia.     No Known Allergies  Patient Active Problem List   Diagnosis Date Noted  . Neutropenia ([place]) 02/12/2017  . Preventative health care 02/12/2017  . Coronary artery disease 11/29/2015  . Cardiomyopathy, ischemic 11/28/2015  . Prediabetes 11/22/2015  . Coronary artery disease involving native coronary artery of native heart with angina pectoris ([place])   . Congestive heart failure (CHF) ([place]) 11/21/2015  . Prostate cancer ([place]) 11/21/2015  . HLD (hyperlipidemia) 11/21/2015  . Congestive dilated cardiomyopathy [facility])      Past Medical History:  Diagnosis Date  . Acute systolic CHF (congestive heart failure) ([place])    a. echo 11/21/15: EF <20%, mildly dilated LV, severe diffuse global HK, GR3DD, mod MR, LA mildly dilated, RV sys fxn mildly reduced, PASP 52 mmHg  . CAD (coronary artery disease)    a. cardiac cath 11/22/15: dLM 40%, pLAD 80-90%, mLAD sequential 40%, D2 90%, mLCx 90% at takeoff of OM1, RCA appeared occluded proximally, LVEF 20%, PCWP 29   . Chickenpox   . Chronic diastolic CHF (congestive heart failure) ([place])   . HLD (hyperlipidemia)   . Prostate cancer [facility])      Past Surgical History:  Procedure Laterality Date  . CARDIAC CATHETERIZATION N/A 11/22/2015   Procedure: Right/Left Heart Cath and Coronary Angiography;  Surgeon: Minna Merritts, MD;  Location: [facility];  Service: Cardiovascular;  Laterality: N/A;  . CARDIAC CATHETERIZATION Left 11/29/2015   Procedure: LEFT FEMORAL ARTERIAL LINE INSERTION;  Surgeon: Gaye Pollack, MD;  Location: [facility] OR;  Service: Open Heart Surgery;  Laterality: Left;  . CORONARY ARTERY BYPASS GRAFT N/A 11/29/2015   Procedure: CORONARY ARTERY BYPASS GRAFTING (CABG) x four, using  left internal mammary artery and right leg saphenous vein harvested endoscopically;  Surgeon: Gaye Pollack, MD;  Location: [facility] OR;  Service: Open Heart Surgery;  Laterality: N/A;  . TEE WITHOUT CARDIOVERSION N/A 11/29/2015   Procedure: TRANSESOPHAGEAL ECHOCARDIOGRAM (TEE);  Surgeon: Gaye Pollack, MD;  Location: [facility];  Service: Open Heart Surgery;  Laterality: N/A;  . TURP VAPORIZATION      Social History   Socioeconomic History  . Marital status: Married    Spouse name: Not on file  . Number of children: Not on file  . Years of education: Not on file  . Highest education level: Not on file  Occupational History  . Not on file  Social Needs  . Financial resource strain: Not on file  . Food insecurity    Worry: Not on file    Inability: Not on file  . Transportation needs    Medical: Not on file    Non-medical: Not on file  Tobacco Use  . Smoking status: Never Smoker  . Smokeless tobacco: Never Used  Substance and Sexual Activity  . Alcohol use: No  . Drug use: No  . Sexual activity: Not on file  Lifestyle  . Physical activity    Days per week: Not on file    Minutes per session: Not on file  . Stress: Not on file  Relationships  . Social Herbalist on phone: Not on file    Gets together: Not on file    Attends religious service: Not on file    Active member of club or organization: Not on file    Attends meetings of clubs or organizations: Not on file    Relationship status: Not on file  . Intimate partner violence    Fear of current or ex partner: Not on file    Emotionally abused: Not on file    Physically abused: Not on file    Forced sexual activity: Not on file  Other Topics Concern  . Not on file  Social History Narrative   Married. Lives with his wife.   2 children, 2 grandchildren.   Retired. Worked as a Furniture conservator/restorer.   Enjoys fishing.      Family History  Problem Relation Age of Onset  . Hypertension Mother   . Healthy Father       Current Outpatient Medications:  .  ASPIRIN LOW DOSE 81 MG EC tablet, TAKE 1 TABLET(81 MG) BY MOUTH DAILY. SWALLOW WHOLE, Disp: 90 tablet, Rfl: 3 .  carvedilol (COREG) 6.25 MG tablet, TAKE 1 AND 1/2 TABLET BY MOUTH TWICE DAILY, Disp: 270 tablet, Rfl: 3 .  finasteride (PROSCAR) 5 MG tablet, Take 1 tablet (5 mg total) by mouth daily., Disp: 90 tablet, Rfl: 3 .  losartan (COZAAR) 25 MG tablet, Take 1 tablet (25 mg total) by mouth daily., Disp: 90 tablet, Rfl: 3 .  rosuvastatin (CRESTOR) 40 MG tablet, Take 1 tablet (40 mg total) by mouth daily. For cholesterol., Disp: 90 tablet, Rfl: 3 .  spironolactone (ALDACTONE) 25 MG tablet, Take 1 tablet (25 mg total) by mouth daily., Disp: 90 tablet, Rfl: 0   Physical exam: There were no vitals filed for this visit. Physical Exam HENT:     Head: Normocephalic and atraumatic.  Eyes:     Pupils:  Pupils are equal, round, and reactive to light.  Neck:     Musculoskeletal: Normal range of motion.  Cardiovascular:     Rate and Rhythm: Normal rate and regular rhythm.     Heart sounds: Normal heart sounds.  Pulmonary:     Effort: Pulmonary effort is normal.     Breath sounds: Normal breath sounds.  Abdominal:     General: Bowel sounds are normal.     Palpations: Abdomen is soft.  Lymphadenopathy:     Comments: No palpable cervical, supraclavicular, axillary or inguinal adenopathy   Skin:    General: Skin is warm and dry.  Neurological:     Mental Status: He is alert and oriented to person, place, and time.        CMP Latest Ref Rng & Units 03/29/2018  Glucose 70 - 99 mg/dL -  BUN 6 - 23 mg/dL -  Creatinine 0.40 - 1.50 mg/dL -  Sodium 135 - 145 mEq/L -  Potassium 3.5 - 5.1 mEq/L -  Chloride 96 - 112 mEq/L -  CO2 19 - 32 mEq/L -  Calcium 8.4 - 10.5 mg/dL -  Total Protein 6.0 - 8.3 g/dL 7.7  Total Bilirubin 0.2 - 1.2 mg/dL 0.7  Alkaline Phos 39 - 117 U/L 48  AST 0 - 37 U/L 15  ALT 0 - 53 U/L 12   CBC Latest Ref Rng & Units 08/14/2017  WBC  4.0 - 10.5 K/uL 2.9(L)  Hemoglobin 13.0 - 17.0 g/dL 12.6(L)  Hematocrit 39.0 - 52.0 % 36.3(L)  Platelets 150.0 - 400.0 K/uL 166.0    Assessment and plan- Patient is a 73 y.o. male referred for anemia and leukopenia  1.  Leukopenia: Patient has had a mild intermittent leukopenia with a white count fluctuating between 2.3 to normal over the last 3 years with mild neutropenia with an [place] between 1-1.5 given his longstanding neutropenia I suspect this is chronic secondary to benign ethnic neutropenia.  Today I will check a CBC with differential, smear review, B12 folate, HIV and hep C antibody  2.  Anemia: His most recent labs from January 2020 revealed a hemoglobin of 13.5.  I will check iron studies, B12, folate, reticulocyte count.  Patient was noted to have IgG kappa monoclonal protein of 0.7 g which I suspect is secondary to MGUS.  I will reopeat his myeloma labs today.  Video visit with me in 1 weeks time   Thank you for this kind referral and the opportunity to participate in the care of this patient   Visit Diagnosis 1. Normocytic anemia   2. MGUS (monoclonal gammopathy of unknown significance)   3. Chronic neutropenia (HCC)     Dr. Randa Evens, MD, MPH [facility] [id number] 11/29/2018  2:17 PM

## 2018-11-30 LAB — KAPPA/LAMBDA LIGHT CHAINS
Kappa free light chain: 36.6 mg/L — ABNORMAL HIGH (ref 3.3–19.4)
Kappa, lambda light chain ratio: 1.78 — ABNORMAL HIGH (ref 0.26–1.65)
Lambda free light chains: 20.6 mg/L (ref 5.7–26.3)

## 2018-11-30 LAB — HIV ANTIBODY (ROUTINE TESTING W REFLEX): HIV Screen 4th Generation wRfx: NONREACTIVE

## 2018-11-30 LAB — HEPATITIS C ANTIBODY: HCV Ab: NONREACTIVE

## 2018-12-01 LAB — MULTIPLE MYELOMA PANEL, SERUM
Albumin SerPl Elph-Mcnc: 3.7 g/dL (ref 2.9–4.4)
Albumin/Glob SerPl: 1.1 (ref 0.7–1.7)
Alpha 1: 0.3 g/dL (ref 0.0–0.4)
Alpha2 Glob SerPl Elph-Mcnc: 1.1 g/dL — ABNORMAL HIGH (ref 0.4–1.0)
B-Globulin SerPl Elph-Mcnc: 1.1 g/dL (ref 0.7–1.3)
Gamma Glob SerPl Elph-Mcnc: 1.2 g/dL (ref 0.4–1.8)
Globulin, Total: 3.6 g/dL (ref 2.2–3.9)
IgA: 239 mg/dL (ref 61–437)
IgG (Immunoglobin G), Serum: 1298 mg/dL (ref 603–1613)
IgM (Immunoglobulin M), Srm: 70 mg/dL (ref 15–143)
M Protein SerPl Elph-Mcnc: 0.5 g/dL — ABNORMAL HIGH
Total Protein ELP: 7.3 g/dL (ref 6.0–8.5)

## 2018-12-06 ENCOUNTER — Inpatient Hospital Stay: Payer: Medicare PPO | Admitting: Oncology

## 2018-12-16 ENCOUNTER — Telehealth: Payer: Self-pay | Admitting: Oncology

## 2018-12-16 NOTE — Telephone Encounter (Signed)
LM to try and R/S NS appt

## 2019-01-03 ENCOUNTER — Telehealth: Payer: Self-pay | Admitting: Primary Care

## 2019-01-03 DIAGNOSIS — C61 Malignant neoplasm of prostate: Secondary | ICD-10-CM

## 2019-01-03 NOTE — Telephone Encounter (Signed)
Pt came into office and stated the pharmacy has sent the below medication over and has been trying to call us since 11/4 about refilling prescription. Pt is currently out of this medication.   Finasteride 5 mg  [employer]  Pt's [place]

## 2019-01-03 NOTE — Telephone Encounter (Signed)
Called patient's wife this morning inform her that [facility] send 90 tablets with 3 refill in 02/2018. Patient's wife will check the pharmacy.

## 2019-01-10 ENCOUNTER — Other Ambulatory Visit: Payer: Self-pay | Admitting: Primary Care

## 2019-01-10 DIAGNOSIS — C61 Malignant neoplasm of prostate: Secondary | ICD-10-CM

## 2019-01-10 MED ORDER — FINASTERIDE 5 MG PO TABS: 5.0000 mg | ORAL_TABLET | Freq: Every day | ORAL | 0 refills | Status: DC

## 2019-01-10 NOTE — Addendum Note (Signed)
Addended by: Pleas Koch on: 01/10/2019 04:50 PM   Modules accepted: Orders

## 2019-01-10 NOTE — Telephone Encounter (Signed)
Noted, will refill Rx for 30 day supply, not sure why they do not have Rx. He is due for follow up in December 2020, please schedule.

## 2019-01-10 NOTE — Telephone Encounter (Signed)
V.M left that pharmacy does not have finasteride and pt request cb.

## 2019-01-11 NOTE — Telephone Encounter (Signed)
Spoken to patient's wife and inform her that refill as been sent. Inform her to remind patient of appointment on 02/21/2019

## 2019-02-10 ENCOUNTER — Other Ambulatory Visit
Admission: RE | Admit: 2019-02-10 | Discharge: 2019-02-10 | Disposition: A | Discharge status: Home / Self Care | Payer: Medicare PPO | Source: Ambulatory Visit | Attending: Internal Medicine | Admitting: Internal Medicine

## 2019-02-10 ENCOUNTER — Other Ambulatory Visit: Payer: Self-pay

## 2019-02-10 DIAGNOSIS — Z20828 Contact with and (suspected) exposure to other viral communicable diseases: Secondary | ICD-10-CM | POA: Insufficient documentation

## 2019-02-10 DIAGNOSIS — Z01812 Encounter for preprocedural laboratory examination: Secondary | ICD-10-CM | POA: Diagnosis not present

## 2019-02-10 LAB — SARS CORONAVIRUS 2 (TAT 6-24 HRS): SARS Coronavirus 2: NEGATIVE

## 2019-02-11 ENCOUNTER — Encounter: Payer: Self-pay | Admitting: Internal Medicine

## 2019-02-14 ENCOUNTER — Ambulatory Visit: Payer: Medicare PPO

## 2019-02-14 ENCOUNTER — Ambulatory Visit: Payer: Medicare PPO | Admitting: Certified Registered"

## 2019-02-14 ENCOUNTER — Ambulatory Visit
Admission: RE | Admit: 2019-02-14 | Discharge: 2019-02-14 | Disposition: A | Discharge status: Home / Self Care | Payer: Medicare PPO | Attending: Internal Medicine | Admitting: Internal Medicine

## 2019-02-14 ENCOUNTER — Encounter
Admission: RE | Disposition: A | Discharge status: Home / Self Care | Payer: Self-pay | Source: Home / Self Care | Attending: Internal Medicine

## 2019-02-14 ENCOUNTER — Encounter: Payer: Self-pay | Admitting: Internal Medicine

## 2019-02-14 DIAGNOSIS — R7303 Prediabetes: Secondary | ICD-10-CM | POA: Diagnosis not present

## 2019-02-14 DIAGNOSIS — Z79899 Other long term (current) drug therapy: Secondary | ICD-10-CM | POA: Diagnosis not present

## 2019-02-14 DIAGNOSIS — Z8546 Personal history of malignant neoplasm of prostate: Secondary | ICD-10-CM | POA: Diagnosis not present

## 2019-02-14 DIAGNOSIS — Z1211 Encounter for screening for malignant neoplasm of colon: Secondary | ICD-10-CM | POA: Insufficient documentation

## 2019-02-14 DIAGNOSIS — K640 First degree hemorrhoids: Secondary | ICD-10-CM | POA: Insufficient documentation

## 2019-02-14 DIAGNOSIS — I110 Hypertensive heart disease with heart failure: Secondary | ICD-10-CM | POA: Diagnosis not present

## 2019-02-14 DIAGNOSIS — Z86010 Personal history of colonic polyps: Secondary | ICD-10-CM | POA: Diagnosis not present

## 2019-02-14 DIAGNOSIS — D123 Benign neoplasm of transverse colon: Secondary | ICD-10-CM | POA: Insufficient documentation

## 2019-02-14 DIAGNOSIS — Z951 Presence of aortocoronary bypass graft: Secondary | ICD-10-CM | POA: Insufficient documentation

## 2019-02-14 DIAGNOSIS — I5032 Chronic diastolic (congestive) heart failure: Secondary | ICD-10-CM | POA: Diagnosis not present

## 2019-02-14 DIAGNOSIS — D125 Benign neoplasm of sigmoid colon: Secondary | ICD-10-CM | POA: Diagnosis not present

## 2019-02-14 DIAGNOSIS — Z7982 Long term (current) use of aspirin: Secondary | ICD-10-CM | POA: Diagnosis not present

## 2019-02-14 DIAGNOSIS — I252 Old myocardial infarction: Secondary | ICD-10-CM | POA: Diagnosis not present

## 2019-02-14 DIAGNOSIS — E785 Hyperlipidemia, unspecified: Secondary | ICD-10-CM | POA: Diagnosis not present

## 2019-02-14 DIAGNOSIS — K635 Polyp of colon: Secondary | ICD-10-CM | POA: Diagnosis not present

## 2019-02-14 DIAGNOSIS — D126 Benign neoplasm of colon, unspecified: Secondary | ICD-10-CM | POA: Diagnosis not present

## 2019-02-14 DIAGNOSIS — K648 Other hemorrhoids: Secondary | ICD-10-CM | POA: Diagnosis not present

## 2019-02-14 DIAGNOSIS — I2510 Atherosclerotic heart disease of native coronary artery without angina pectoris: Secondary | ICD-10-CM | POA: Insufficient documentation

## 2019-02-14 HISTORY — DX: Prediabetes: R73.03

## 2019-02-14 HISTORY — PX: COLONOSCOPY WITH PROPOFOL: SHX5780

## 2019-02-14 LAB — GLUCOSE, CAPILLARY: Glucose-Capillary: 87 mg/dL (ref 70–99)

## 2019-02-14 SURGERY — COLONOSCOPY WITH PROPOFOL
Anesthesia: General

## 2019-02-14 MED ORDER — PROPOFOL 10 MG/ML IV BOLUS
INTRAVENOUS | Status: DC | PRN
  Administered 2019-02-14: 10 mg via INTRAVENOUS
  Administered 2019-02-14: 50 mg via INTRAVENOUS
  Administered 2019-02-14: 10 mg via INTRAVENOUS

## 2019-02-14 MED ORDER — PROPOFOL 500 MG/50ML IV EMUL
INTRAVENOUS | Status: DC | PRN
  Administered 2019-02-14: 150 ug/kg/min via INTRAVENOUS

## 2019-02-14 MED ORDER — EPHEDRINE SULFATE 50 MG/ML IJ SOLN
INTRAMUSCULAR | Status: DC | PRN
  Administered 2019-02-14: 10 mg via INTRAVENOUS
  Administered 2019-02-14 (×2): 5 mg via INTRAVENOUS
  Administered 2019-02-14: 10 mg via INTRAVENOUS

## 2019-02-14 MED ORDER — PHENYLEPHRINE HCL (PRESSORS) 10 MG/ML IV SOLN
INTRAVENOUS | Status: DC | PRN
  Administered 2019-02-14: 100 ug via INTRAVENOUS

## 2019-02-14 MED ORDER — LIDOCAINE HCL (CARDIAC) PF 100 MG/5ML IV SOSY
PREFILLED_SYRINGE | INTRAVENOUS | Status: DC | PRN
  Administered 2019-02-14: 100 mg via INTRATRACHEAL

## 2019-02-14 MED ORDER — SODIUM CHLORIDE 0.9 % IV SOLN: INTRAVENOUS | Status: DC

## 2019-02-14 NOTE — H&P (Signed)
Outpatient short stay form Pre-procedure 02/14/2019 11:02 AM Shane Kim K. [name], M.D.  Primary Physician: [name], NP  Reason for visit:  Personal hx of adenomatous colon polyps.  History of present illness:                            Patient presents for colonoscopy for a personal hx of colon polyps. The patient denies abdominal pain, abnormal weight loss or rectal bleeding.      Current Facility-Administered Medications:  .  0.9 %  sodium chloride infusion, , Intravenous, Continuous, Vanga, Tally Due, MD  Medications Prior to Admission  Medication Sig Dispense Refill Last Dose  . carvedilol (COREG) 6.25 MG tablet TAKE 1 AND 1/2 TABLET BY MOUTH TWICE DAILY (Patient taking differently: 6.25 mg. ) 270 tablet 3 02/14/2019 at Unknown time  . losartan (COZAAR) 25 MG tablet Take 1 tablet (25 mg total) by mouth daily. 90 tablet 3 02/14/2019 at Unknown time  . rosuvastatin (CRESTOR) 40 MG tablet Take 1 tablet (40 mg total) by mouth daily. For cholesterol. 90 tablet 3 02/14/2019 at Unknown time  . spironolactone (ALDACTONE) 25 MG tablet Take 1 tablet (25 mg total) by mouth daily. 90 tablet 0 02/14/2019 at Unknown time  . ASPIRIN LOW DOSE 81 MG EC tablet TAKE 1 TABLET(81 MG) BY MOUTH DAILY. SWALLOW WHOLE 90 tablet 3 02/12/2019  . finasteride (PROSCAR) 5 MG tablet TAKE 1 TABLET(5 MG) BY MOUTH DAILY 90 tablet 0      No Known Allergies   Past Medical History:  Diagnosis Date  . Acute systolic CHF (congestive heart failure) ([place])    a. echo 11/21/15: EF <20%, mildly dilated LV, severe diffuse global HK, GR3DD, mod MR, LA mildly dilated, RV sys fxn mildly reduced, PASP 52 mmHg  . CAD (coronary artery disease)    a. cardiac cath 11/22/15: dLM 40%, pLAD 80-90%, mLAD sequential 40%, D2 90%, mLCx 90% at takeoff of OM1, RCA appeared occluded proximally, LVEF 20%, PCWP 29   . Chickenpox   . Chronic diastolic CHF (congestive heart failure) ([place])   . HLD (hyperlipidemia)   . Pre-diabetes    . Prostate cancer [facility])     Review of systems:  Otherwise negative.    Physical Exam  Gen: Alert, oriented. Appears stated age.  HEENT: [place]/AT. PERRLA. Lungs: CTA, no wheezes. CV: RR nl S1, S2. Abd: soft, benign, no masses. BS+ Ext: No edema. Pulses 2+    Planned procedures: Proceed with colonoscopy. The patient understands the nature of the planned procedure, indications, risks, alternatives and potential complications including but not limited to bleeding, infection, perforation, damage to internal organs and possible oversedation/side effects from anesthesia. The patient agrees and gives consent to proceed.  Please refer to procedure notes for findings, recommendations and patient disposition/instructions.     [name], M.D. Gastroenterology 02/14/2019  11:02 AM

## 2019-02-14 NOTE — Anesthesia Post-op Follow-up Note (Signed)
Anesthesia QCDR form completed.        

## 2019-02-14 NOTE — Transfer of Care (Addendum)
Immediate Anesthesia Transfer of Care Note  Patient: [facility]  Procedure(s) Performed: COLONOSCOPY WITH PROPOFOL (N/A )  Patient Location: Endoscopy Unit  Anesthesia Type:General  Level of Consciousness: drowsy, patient cooperative and responds to stimulation  Airway & Oxygen Therapy: Patient Spontanous Breathing and Patient connected to face mask oxygen  Post-op Assessment: Report given to RN and Post -op Vital signs reviewed and stable  Post vital signs: Reviewed and stable  Last Vitals:  Vitals Value Taken Time  BP 92/68 02/14/19 1147  Temp    Pulse 80 02/14/19 1150  Resp 13 02/14/19 1150  SpO2 100 % 02/14/19 1150  Vitals shown include unvalidated device data.  Last Pain:  Vitals:   02/14/19 1147  PainSc: Asleep         Complications: No apparent anesthesia complications

## 2019-02-14 NOTE — Interval H&P Note (Signed)
History and Physical Interval Note:  02/14/2019 11:03 AM  [facility]  has presented today for surgery, with the diagnosis of HX ADEN COLON POLYPS.  The various methods of treatment have been discussed with the patient and family. After consideration of risks, benefits and other options for treatment, the patient has consented to  Procedure(s): COLONOSCOPY WITH PROPOFOL (N/A) as a surgical intervention.  The patient's history has been reviewed, patient examined, no change in status, stable for surgery.  I have reviewed the patient's chart and labs.  Questions were answered to the patient's satisfaction.     [place], [place]

## 2019-02-14 NOTE — Anesthesia Preprocedure Evaluation (Signed)
Anesthesia Evaluation  Patient identified by MRN, date of birth, ID band Patient awake    Reviewed: Allergy & Precautions, H&P , NPO status , Patient's Chart, lab work & pertinent test results, reviewed documented beta blocker date and time   History of Anesthesia Complications Negative for: history of anesthetic complications  Airway Mallampati: III  TM Distance: >3 FB Neck ROM: full    Dental  (+) Missing, Dental Advidsory Given, Teeth Intact   Pulmonary neg pulmonary ROS,    Pulmonary exam normal        Cardiovascular Exercise Tolerance: Good hypertension, (-) angina+ CAD, + Past MI, + CABG and +CHF  (-) Cardiac Stents Normal cardiovascular exam(-) dysrhythmias (-) Valvular Problems/Murmurs     Neuro/Psych negative neurological ROS  negative psych ROS   GI/Hepatic Neg liver ROS, GERD  ,  Endo/Other  diabetes (borderline)  Renal/GU negative Renal ROS  negative genitourinary   Musculoskeletal   Abdominal   Peds  Hematology negative hematology ROS (+)   Anesthesia Other Findings Past Medical History: No date: Acute systolic CHF (congestive heart failure) (HCC)     Comment:  a. echo 11/21/15: EF <20%, mildly dilated LV, severe               diffuse global HK, GR3DD, mod MR, LA mildly dilated, RV               sys fxn mildly reduced, PASP 52 mmHg No date: CAD (coronary artery disease)     Comment:  a. cardiac cath 11/22/15: dLM 40%, pLAD 80-90%, mLAD               sequential 40%, D2 90%, mLCx 90% at takeoff of OM1, RCA               appeared occluded proximally, LVEF 20%, PCWP 29  No date: Chickenpox No date: Chronic diastolic CHF (congestive heart failure) (HCC) No date: HLD (hyperlipidemia) No date: Pre-diabetes No date: Prostate cancer (HCC)   Reproductive/Obstetrics negative OB ROS                             Anesthesia Physical Anesthesia Plan  ASA: III  Anesthesia Plan:  General   Post-op Pain Management:    Induction: Intravenous  PONV Risk Score and Plan: 2 and Propofol infusion and TIVA  Airway Management Planned: Natural Airway and Nasal Cannula  Additional Equipment:   Intra-op Plan:   Post-operative Plan:   Informed Consent: I have reviewed the patients History and Physical, chart, labs and discussed the procedure including the risks, benefits and alternatives for the proposed anesthesia with the patient or authorized representative who has indicated his/her understanding and acceptance.     Dental Advisory Given  Plan Discussed with: Anesthesiologist, CRNA and Surgeon  Anesthesia Plan Comments:         Anesthesia Quick Evaluation

## 2019-02-14 NOTE — Op Note (Signed)
[facility] Gastroenterology Patient Name: Shane Kim Procedure Date: 02/14/2019 10:51 AM MRN: [id number] Account #: [id number] Date of Birth: 07-Dec-1945 Admit Type: Outpatient Age: 73 Room: [facility] ENDO ROOM 1 Gender: Male Note Status: Finalized Procedure:             Colonoscopy Indications:           Surveillance: Personal history of adenomatous polyps                         on last colonoscopy 5 years ago Providers:             Lorie Apley K. [name] MD, MD Referring MD:          Pleas Koch (Referring MD) Medicines:             Propofol per Anesthesia Complications:         No immediate complications. Procedure:             Pre-Anesthesia Assessment:                        - The risks and benefits of the procedure and the                         sedation options and risks were discussed with the                         patient. All questions were answered and informed                         consent was obtained.                        - Patient identification and proposed procedure were                         verified prior to the procedure by the nurse. The                         procedure was verified in the procedure room.                        - ASA Grade Assessment: III - A patient with severe                         systemic disease.                        - After reviewing the risks and benefits, the patient                         was deemed in satisfactory condition to undergo the                         procedure.                        After obtaining informed consent, the colonoscope was                         passed under direct vision. Throughout  the procedure,                         the patient's blood pressure, pulse, and oxygen                         saturations were monitored continuously. The                         Colonoscope was introduced through the anus and                         advanced to the the cecum, identified by  appendiceal                         orifice and ileocecal valve. The colonoscopy was                         performed without difficulty. The patient tolerated                         the procedure well. The quality of the bowel                         preparation was good. The ileocecal valve, appendiceal                         orifice, and rectum were photographed. Findings:      The perianal and digital rectal examinations were normal. Pertinent       negatives include normal sphincter tone.      A 6 mm polyp was found in the sigmoid colon. The polyp was sessile. The       polyp was removed with a cold snare. Resection and retrieval were       complete.      Two semi-pedunculated polyps were found in the transverse colon. The       polyps were 5 to 8 mm in size. These polyps were removed with a hot       snare. Resection and retrieval were complete.      The exam was otherwise without abnormality on direct and retroflexion       views.      Non-bleeding internal hemorrhoids were found during retroflexion. The       hemorrhoids were Grade I (internal hemorrhoids that do not prolapse). Impression:            - One 6 mm polyp in the sigmoid colon, removed with a                         cold snare. Resected and retrieved.                        - Two 5 to 8 mm polyps in the transverse colon,                         removed with a hot snare. Resected and retrieved.                        - The examination was otherwise normal on direct and  retroflexion views. Recommendation:        - Patient has a contact number available for                         emergencies. The signs and symptoms of potential                         delayed complications were discussed with the patient.                         Return to normal activities tomorrow. Written                         discharge instructions were provided to the patient.                        - Resume previous  diet.                        - Continue present medications.                        - Await pathology results.                        - If polyps are benign or adenomatous without                         dysplasia, I will advise NO further colonoscopy due to                         advanced age and/or severe comorbidity.                        - The findings and recommendations were discussed with                         the patient. Procedure Code(s):     --- Professional ---                        [phone], Colonoscopy, flexible; with removal of                         tumor(s), polyp(s), or other lesion(s) by snare                         technique Diagnosis Code(s):     --- Professional ---                        Z86.010, Personal history of colonic polyps                        K63.5, Polyp of colon CPT copyright 2019 American Medical Association. All rights reserved. The codes documented in this report are preliminary and upon coder review may  be revised to meet current compliance requirements. Efrain Sella MD, MD 02/14/2019 11:51:35 AM This report has been signed electronically. Number of Addenda: 0 Note Initiated On: 02/14/2019 10:51 AM Scope Withdrawal Time: 0 hours 4 minutes 35 seconds  Total Procedure Duration: 0  hours 16 minutes 6 seconds  Estimated Blood Loss:  Estimated blood loss: none. Estimated blood loss: none.      [facility]

## 2019-02-14 NOTE — Anesthesia Postprocedure Evaluation (Signed)
Anesthesia Post Note  Patient: [facility]  Procedure(s) Performed: COLONOSCOPY WITH PROPOFOL (N/A )  Patient location during evaluation: Endoscopy Anesthesia Type: General Level of consciousness: awake and alert Pain management: pain level controlled Vital Signs Assessment: post-procedure vital signs reviewed and stable Respiratory status: spontaneous breathing, nonlabored ventilation, respiratory function stable and patient connected to nasal cannula oxygen Cardiovascular status: blood pressure returned to baseline and stable Postop Assessment: no apparent nausea or vomiting Anesthetic complications: no     Last Vitals:  Vitals:   02/14/19 1217 02/14/19 1225  BP: (!) 79/35 100/74  Pulse: 77 75  Resp: 17 15  Temp:    SpO2: 100% 100%    Last Pain:  Vitals:   02/14/19 1225  TempSrc:   PainSc: 0-No pain                 Martha Clan

## 2019-02-14 NOTE — Interval H&P Note (Signed)
History and Physical Interval Note:  02/14/2019 11:18 AM  [facility]  has presented today for surgery, with the diagnosis of HX ADEN COLON POLYPS.  The various methods of treatment have been discussed with the patient and family. After consideration of risks, benefits and other options for treatment, the patient has consented to  Procedure(s): COLONOSCOPY WITH PROPOFOL (N/A) as a surgical intervention.  The patient's history has been reviewed, patient examined, no change in status, stable for surgery.  I have reviewed the patient's chart and labs.  Questions were answered to the patient's satisfaction.     [place]

## 2019-02-15 ENCOUNTER — Encounter: Payer: Self-pay | Admitting: *Deleted

## 2019-02-15 LAB — SURGICAL PATHOLOGY

## 2019-02-16 ENCOUNTER — Ambulatory Visit: Payer: Medicare PPO

## 2019-02-21 ENCOUNTER — Encounter: Payer: Medicare PPO | Admitting: Primary Care

## 2019-02-24 ENCOUNTER — Ambulatory Visit: Payer: Medicare PPO | Admitting: Primary Care

## 2019-03-02 ENCOUNTER — Other Ambulatory Visit: Payer: Self-pay | Admitting: Primary Care

## 2019-03-02 DIAGNOSIS — E785 Hyperlipidemia, unspecified: Secondary | ICD-10-CM

## 2019-03-02 MED ORDER — ROSUVASTATIN CALCIUM 40 MG PO TABS: 40.0000 mg | ORAL_TABLET | Freq: Every day | ORAL | 1 refills | Status: DC

## 2019-03-03 ENCOUNTER — Other Ambulatory Visit: Payer: Self-pay

## 2019-03-03 ENCOUNTER — Ambulatory Visit (INDEPENDENT_AMBULATORY_CARE_PROVIDER_SITE_OTHER): Payer: Medicare PPO

## 2019-03-03 DIAGNOSIS — Z0000 Encounter for general adult medical examination without abnormal findings: Secondary | ICD-10-CM | POA: Diagnosis not present

## 2019-03-03 NOTE — Patient Instructions (Signed)
[name] , Thank you for taking time to come for your Medicare Wellness Visit. I appreciate your ongoing commitment to your health goals. Please review the following plan we discussed and let me know if I can assist you in the future.   Screening recommendations/referrals: Colonoscopy: Up to date, completed 02/14/2019 Recommended yearly ophthalmology/optometry visit for glaucoma screening and checkup Recommended yearly dental visit for hygiene and checkup  Vaccinations: Influenza vaccine: Up to date, completed 11/22/2018 Pneumococcal vaccine: Completed series Tdap vaccine: decline Shingles vaccine: discussed    Advanced directives: Please bring a copy of your POA (Power of Attorney) and/or Living Will to your next appointment.   Conditions/risks identified: hyperlipidemia  Next appointment: 03/10/2019 @ 10:40 am   Preventive Care 65 Years and Older, Male Preventive care refers to lifestyle choices and visits with your health care provider that can promote health and wellness. What does preventive care include?  A yearly physical exam. This is also called an annual well check.  Dental exams once or twice a year.  Routine eye exams. Ask your health care provider how often you should have your eyes checked.  Personal lifestyle choices, including:  Daily care of your teeth and gums.  Regular physical activity.  Eating a healthy diet.  Avoiding tobacco and drug use.  Limiting alcohol use.  Practicing safe sex.  Taking low doses of aspirin every day.  Taking vitamin and mineral supplements as recommended by your health care provider. What happens during an annual well check? The services and screenings done by your health care provider during your annual well check will depend on your age, overall health, lifestyle risk factors, and family history of disease. Counseling  Your health care provider may ask you questions about your:  Alcohol use.  Tobacco use.  Drug  use.  Emotional well-being.  Home and relationship well-being.  Sexual activity.  Eating habits.  History of falls.  Memory and ability to understand (cognition).  Work and work Statistician. Screening  You may have the following tests or measurements:  Height, weight, and BMI.  Blood pressure.  Lipid and cholesterol levels. These may be checked every 5 years, or more frequently if you are over 81 years old.  Skin check.  Lung cancer screening. You may have this screening every year starting at age 76 if you have a 30-pack-year history of smoking and currently smoke or have quit within the past 15 years.  Fecal occult blood test (FOBT) of the stool. You may have this test every year starting at age 76.  Flexible sigmoidoscopy or colonoscopy. You may have a sigmoidoscopy every 5 years or a colonoscopy every 10 years starting at age 76.  Prostate cancer screening. Recommendations will vary depending on your family history and other risks.  Hepatitis C blood test.  Hepatitis B blood test.  Sexually transmitted disease (STD) testing.  Diabetes screening. This is done by checking your blood sugar (glucose) after you have not eaten for a while (fasting). You may have this done every 1-3 years.  Abdominal aortic aneurysm (AAA) screening. You may need this if you are a current or former smoker.  Osteoporosis. You may be screened starting at age 76 if you are at high risk. Talk with your health care provider about your test results, treatment options, and if necessary, the need for more tests. Vaccines  Your health care provider may recommend certain vaccines, such as:  Influenza vaccine. This is recommended every year.  Tetanus, diphtheria, and acellular pertussis (  Tdap, Td) vaccine. You may need a Td booster every 10 years.  Zoster vaccine. You may need this after age 76.  Pneumococcal 13-valent conjugate (PCV13) vaccine. One dose is recommended after age  76.  Pneumococcal polysaccharide (PPSV23) vaccine. One dose is recommended after age 2. Talk to your health care provider about which screenings and vaccines you need and how often you need them. This information is not intended to replace advice given to you by your health care provider. Make sure you discuss any questions you have with your health care provider. Document Released: 03/09/2015 Document Revised: 10/31/2015 Document Reviewed: 12/12/2014 Elsevier Interactive Patient Education  2017 [place] Prevention in the Home Falls can cause injuries. They can happen to people of all ages. There are many things you can do to make your home safe and to help prevent falls. What can I do on the outside of my home?  Regularly fix the edges of walkways and driveways and fix any cracks.  Remove anything that might make you trip as you walk through a door, such as a raised step or threshold.  Trim any bushes or trees on the path to your home.  Use bright outdoor lighting.  Clear any walking paths of anything that might make someone trip, such as rocks or tools.  Regularly check to see if handrails are loose or broken. Make sure that both sides of any steps have handrails.  Any raised decks and porches should have guardrails on the edges.  Have any leaves, snow, or ice cleared regularly.  Use sand or salt on walking paths during winter.  Clean up any spills in your garage right away. This includes oil or grease spills. What can I do in the bathroom?  Use night lights.  Install grab bars by the toilet and in the tub and shower. Do not use towel bars as grab bars.  Use non-skid mats or decals in the tub or shower.  If you need to sit down in the shower, use a plastic, non-slip stool.  Keep the floor dry. Clean up any water that spills on the floor as soon as it happens.  Remove soap buildup in the tub or shower regularly.  Attach bath mats securely with double-sided  non-slip rug tape.  Do not have throw rugs and other things on the floor that can make you trip. What can I do in the bedroom?  Use night lights.  Make sure that you have a light by your bed that is easy to reach.  Do not use any sheets or blankets that are too big for your bed. They should not hang down onto the floor.  Have a firm chair that has side arms. You can use this for support while you get dressed.  Do not have throw rugs and other things on the floor that can make you trip. What can I do in the kitchen?  Clean up any spills right away.  Avoid walking on wet floors.  Keep items that you use a lot in easy-to-reach places.  If you need to reach something above you, use a strong step stool that has a grab bar.  Keep electrical cords out of the way.  Do not use floor polish or wax that makes floors slippery. If you must use wax, use non-skid floor wax.  Do not have throw rugs and other things on the floor that can make you trip. What can I do with my stairs?  Do  not leave any items on the stairs.  Make sure that there are handrails on both sides of the stairs and use them. Fix handrails that are broken or loose. Make sure that handrails are as long as the stairways.  Check any carpeting to make sure that it is firmly attached to the stairs. Fix any carpet that is loose or worn.  Avoid having throw rugs at the top or bottom of the stairs. If you do have throw rugs, attach them to the floor with carpet tape.  Make sure that you have a light switch at the top of the stairs and the bottom of the stairs. If you do not have them, ask someone to add them for you. What else can I do to help prevent falls?  Wear shoes that:  Do not have high heels.  Have rubber bottoms.  Are comfortable and fit you well.  Are closed at the toe. Do not wear sandals.  If you use a stepladder:  Make sure that it is fully opened. Do not climb a closed stepladder.  Make sure that both  sides of the stepladder are locked into place.  Ask someone to hold it for you, if possible.  Clearly mark and make sure that you can see:  Any grab bars or handrails.  First and last steps.  Where the edge of each step is.  Use tools that help you move around (mobility aids) if they are needed. These include:  Canes.  Walkers.  Scooters.  Crutches.  Turn on the lights when you go into a dark area. Replace any light bulbs as soon as they burn out.  Set up your furniture so you have a clear path. Avoid moving your furniture around.  If any of your floors are uneven, fix them.  If there are any pets around you, be aware of where they are.  Review your medicines with your doctor. Some medicines can make you feel dizzy. This can increase your chance of falling. Ask your doctor what other things that you can do to help prevent falls. This information is not intended to replace advice given to you by your health care provider. Make sure you discuss any questions you have with your health care provider. Document Released: 12/07/2008 Document Revised: 07/19/2015 Document Reviewed: 03/17/2014 Elsevier Interactive Patient Education  2017 [employer].

## 2019-03-03 NOTE — Progress Notes (Signed)
Subjective:   Shane Kim is a 74 y.o. male who presents for Medicare Annual/Subsequent preventive examination.  Review of Systems: N/A   This visit is being conducted through telemedicine via telephone at the nurse health advisor's home address due to the COVID-19 pandemic. This patient has given me verbal consent via doximity to conduct this visit, patient states they are participating from their home address. Patient and myself are on the telephone call. There is no referral for this visit. Some vital signs may be absent or patient reported.    Patient identification: identified by name, DOB, and current address   Cardiac Risk Factors include: advanced age (>36men, >61 women);dyslipidemia;male gender     Objective:    Vitals: There were no vitals taken for this visit.  There is no height or weight on file to calculate BMI.  Advanced Directives 03/03/2019 02/14/2019 11/29/2018 02/08/2018 02/05/2017 11/22/2015 11/21/2015  Does Patient Have a Medical Advance Directive? Yes No No No No No No  Type of Paramedic of [place];Living will - - - - - -  Copy of [place] in Chart? No - copy requested - - - - - -  Would patient like information on creating a medical advance directive? - No - Guardian declined No - Patient declined No - Patient declined No - Patient declined No - patient declined information No - patient declined information    Tobacco Social History   Tobacco Use  Smoking Status Never Smoker  Smokeless Tobacco Never Used     Counseling given: Not Answered   Clinical Intake:  Pre-visit preparation completed: Yes  Pain : No/denies pain Pain Score: 0-No pain     Nutritional Risks: None Diabetes: No  How often do you need to have someone help you when you read instructions, pamphlets, or other written materials from your doctor or pharmacy?: 1 - Never What is the last grade level you completed in school?: 12th   Interpreter Needed?: No  Information entered by :: CJohnson, LPN  Past Medical History:  Diagnosis Date  . Acute systolic CHF (congestive heart failure) ([place])    a. echo 11/21/15: EF <20%, mildly dilated LV, severe diffuse global HK, GR3DD, mod MR, LA mildly dilated, RV sys fxn mildly reduced, PASP 52 mmHg  . CAD (coronary artery disease)    a. cardiac cath 11/22/15: dLM 40%, pLAD 80-90%, mLAD sequential 40%, D2 90%, mLCx 90% at takeoff of OM1, RCA appeared occluded proximally, LVEF 20%, PCWP 29   . Chickenpox   . Chronic diastolic CHF (congestive heart failure) ([place])   . HLD (hyperlipidemia)   . Pre-diabetes   . Prostate cancer [facility])    Past Surgical History:  Procedure Laterality Date  . CARDIAC CATHETERIZATION N/A 11/22/2015   Procedure: Right/Left Heart Cath and Coronary Angiography;  Surgeon: Minna Merritts, MD;  Location: [place] [facility];  Service: Cardiovascular;  Laterality: N/A;  . CARDIAC CATHETERIZATION Left 11/29/2015   Procedure: LEFT FEMORAL ARTERIAL LINE INSERTION;  Surgeon: Gaye Pollack, MD;  Location: [place];  Service: Open Heart Surgery;  Laterality: Left;  . COLONOSCOPY WITH PROPOFOL N/A 02/14/2019   Procedure: COLONOSCOPY WITH PROPOFOL;  Surgeon: Toledo, Benay Pike, MD;  Location: [facility];  Service: Gastroenterology;  Laterality: N/A;  . CORONARY ARTERY BYPASS GRAFT N/A 11/29/2015   Procedure: CORONARY ARTERY BYPASS GRAFTING (CABG) x four, using left internal mammary artery and right leg saphenous vein harvested endoscopically;  Surgeon: Gaye Pollack, MD;  [facility];  Service: Open Heart Surgery;  Laterality: N/A;  . TEE WITHOUT CARDIOVERSION N/A 11/29/2015   Procedure: TRANSESOPHAGEAL ECHOCARDIOGRAM (TEE);  Surgeon: Gaye Pollack, MD;  Location: [place];  Service: Open Heart Surgery;  Laterality: N/A;  . TURP VAPORIZATION     Family History  Problem Relation Age of Onset  . Hypertension Mother   . Healthy Father    Social History    Socioeconomic History  . Marital status: Married    Spouse name: Not on file  . Number of children: Not on file  . Years of education: Not on file  . Highest education level: Not on file  Occupational History  . Not on file  Tobacco Use  . Smoking status: Never Smoker  . Smokeless tobacco: Never Used  Substance and Sexual Activity  . Alcohol use: No  . Drug use: No  . Sexual activity: Not on file  Other Topics Concern  . Not on file  Social History Narrative   Married. Lives with his wife.   2 children, 2 grandchildren.   Retired. Worked as a Furniture conservator/restorer.   Enjoys fishing.    Social Determinants of Health   Financial Resource Strain: Low Risk   . Difficulty of Paying Living Expenses: Not hard at all  Food Insecurity: No Food Insecurity  . Worried About Charity fundraiser in the Last Year: Never true  . Ran Out of Food in the Last Year: Never true  Transportation Needs: No Transportation Needs  . Lack of Transportation (Medical): No  . Lack of Transportation (Non-Medical): No  Physical Activity: Inactive  . Days of Exercise per Week: 0 days  . Minutes of Exercise per Session: 0 min  Stress: No Stress Concern Present  . Feeling of Stress : Not at all  Social Connections:   . Frequency of Communication with Friends and Family: Not on file  . Frequency of Social Gatherings with Friends and Family: Not on file  . Attends Religious Services: Not on file  . Active Member of Clubs or Organizations: Not on file  . Attends Archivist Meetings: Not on file  . Marital Status: Not on file    Outpatient Encounter Medications as of 03/03/2019  Medication Sig  . ASPIRIN LOW DOSE 81 MG EC tablet TAKE 1 TABLET(81 MG) BY MOUTH DAILY. SWALLOW WHOLE  . carvedilol (COREG) 6.25 MG tablet TAKE 1 AND 1/2 TABLET BY MOUTH TWICE DAILY (Patient taking differently: 6.25 mg. )  . finasteride (PROSCAR) 5 MG tablet TAKE 1 TABLET(5 MG) BY MOUTH DAILY  . losartan (COZAAR) 25 MG tablet Take 1  tablet (25 mg total) by mouth daily.  . rosuvastatin (CRESTOR) 40 MG tablet Take 1 tablet (40 mg total) by mouth daily. For cholesterol.  Marland Kitchen spironolactone (ALDACTONE) 25 MG tablet Take 1 tablet (25 mg total) by mouth daily.   No facility-administered encounter medications on file as of 03/03/2019.    Activities of Daily Living In your present state of health, do you have any difficulty performing the following activities: 03/03/2019  Hearing? N  Vision? N  Difficulty concentrating or making decisions? N  Walking or climbing stairs? N  Dressing or bathing? N  Doing errands, shopping? N  Preparing Food and eating ? N  Using the Toilet? N  In the past six months, have you accidently leaked urine? N  Do you have problems with loss of bowel control? N  Managing your Medications? N  Managing your Finances?  N  Housekeeping or managing your Housekeeping? N  Some recent data might be hidden    Patient Care Team: Pleas Koch, NP as PCP - General (Internal Medicine)   Assessment:   This is a routine wellness examination for Fausto.  Exercise Activities and Dietary recommendations Current Exercise Habits: The patient does not participate in regular exercise at present, Exercise limited by: None identified  Goals    . DIET - INCREASE WATER INTAKE     Starting 02/08/2018, I will continue to drink at least 1 gallon of water daily.     . Patient Stated     03/03/2019, I will maintain and continue medications as prescribed.        Fall Risk Fall Risk  03/03/2019 02/08/2018 02/05/2017  Falls in the past year? 0 0 No  Number falls in past yr: 0 - -  Injury with Fall? 0 - -  Risk for fall due to : Medication side effect - -  Follow up Falls evaluation completed;Falls prevention discussed - -   Is the patient's home free of loose throw rugs in walkways, pet beds, electrical cords, etc?   yes      Grab bars in the bathroom? no      Handrails on the stairs?   yes      Adequate  lighting?   yes  Timed Get Up and Go Performed: N/A  Depression Screen PHQ 2/9 Scores 03/03/2019 02/08/2018 02/05/2017  PHQ - 2 Score 0 0 0  PHQ- 9 Score 0 0 0    Cognitive Function MMSE - Mini Mental State Exam 03/03/2019 02/08/2018 02/05/2017  Not completed: Refused - -  Orientation to time - 5 5  Orientation to Place - 5 5  Registration - 3 3  Attention/ Calculation - 0 0  Recall - 3 3  Language- name 2 objects - 0 0  Language- repeat - 1 1  Language- follow 3 step command - 3 3  Language- read & follow direction - 0 0  Write a sentence - 0 0  Copy design - 0 0  Total score - 20 20  Mini Cog  Mini-Cog screen was not completed. Patient refused. Maximum score is 22. A value of 0 denotes this part of the MMSE was not completed or the patient failed this part of the Mini-Cog screening.       Immunization History  Administered Date(s) Administered  . Influenza, High Dose Seasonal PF 10/29/2015, 10/19/2016, 12/29/2017, 11/22/2018  . Influenza-Unspecified 10/24/2016, 12/29/2017  . PPD Test 01/21/2017  . Pneumococcal Conjugate-13 10/29/2015, 02/05/2017  . Pneumococcal Polysaccharide-23 02/05/2017    Qualifies for Shingles Vaccine? Yes  Screening Tests Health Maintenance  Topic Date Due  . TETANUS/TDAP  03/02/2020 (Originally 07/05/1964)  . COLONOSCOPY  02/13/2029  . INFLUENZA VACCINE  Completed  . Hepatitis C Screening  Completed  . PNA vac Low Risk Adult  Completed   Cancer Screenings: Lung: Low Dose CT Chest recommended if Age 69-80 years, 30 pack-year currently smoking OR have quit w/in 15years. Patient does not qualify. Colorectal: completed 02/14/2019  Additional Screenings:  Hepatitis C Screening: 11/29/2018      Plan:    Patient will maintain and continue medications as prescribed.   I have personally reviewed and noted the following in the patient's chart:   . Medical and social history . Use of alcohol, tobacco or illicit drugs  . Current medications  and supplements . Functional ability and status . Nutritional status .  Physical activity . Advanced directives . List of other physicians . Hospitalizations, surgeries, and ER visits in previous 12 months . Vitals . Screenings to include cognitive, depression, and falls . Referrals and appointments  In addition, I have reviewed and discussed with patient certain preventive protocols, quality metrics, and best practice recommendations. A written personalized care plan for preventive services as well as general preventive health recommendations were provided to patient.     Andrez Grime, LPN  [phone]

## 2019-03-03 NOTE — Progress Notes (Signed)
PCP notes:  Health Maintenance: Tdap- insurance/financial   Abnormal Screenings: MMSE refused- Patient became agitated about how he does not trust doctors and the current events in the world.    Patient concerns: none   Nurse concerns: none   Next PCP appt.: 03/10/2019 @ 10:40 am

## 2019-03-10 ENCOUNTER — Ambulatory Visit: Payer: Medicare PPO | Admitting: Primary Care

## 2019-03-10 ENCOUNTER — Encounter: Payer: Self-pay | Admitting: Primary Care

## 2019-03-10 ENCOUNTER — Other Ambulatory Visit: Payer: Self-pay

## 2019-03-10 VITALS — BP 108/72 | HR 78 | Temp 97.9°F | Ht 72.0 in | Wt 236.8 lb

## 2019-03-10 DIAGNOSIS — N320 Bladder-neck obstruction: Secondary | ICD-10-CM

## 2019-03-10 DIAGNOSIS — Z86010 Personal history of colonic polyps: Secondary | ICD-10-CM

## 2019-03-10 DIAGNOSIS — D709 Neutropenia, unspecified: Secondary | ICD-10-CM | POA: Diagnosis not present

## 2019-03-10 DIAGNOSIS — R7303 Prediabetes: Secondary | ICD-10-CM | POA: Diagnosis not present

## 2019-03-10 DIAGNOSIS — I25119 Atherosclerotic heart disease of native coronary artery with unspecified angina pectoris: Secondary | ICD-10-CM

## 2019-03-10 DIAGNOSIS — D72819 Decreased white blood cell count, unspecified: Secondary | ICD-10-CM

## 2019-03-10 DIAGNOSIS — E785 Hyperlipidemia, unspecified: Secondary | ICD-10-CM

## 2019-03-10 DIAGNOSIS — Z0000 Encounter for general adult medical examination without abnormal findings: Secondary | ICD-10-CM

## 2019-03-10 DIAGNOSIS — Z125 Encounter for screening for malignant neoplasm of prostate: Secondary | ICD-10-CM

## 2019-03-10 DIAGNOSIS — I5022 Chronic systolic (congestive) heart failure: Secondary | ICD-10-CM

## 2019-03-10 DIAGNOSIS — C61 Malignant neoplasm of prostate: Secondary | ICD-10-CM | POA: Diagnosis not present

## 2019-03-10 DIAGNOSIS — Z860101 Personal history of adenomatous and serrated colon polyps: Secondary | ICD-10-CM

## 2019-03-10 LAB — CBC
HCT: 38.3 % — ABNORMAL LOW (ref 39.0–52.0)
Hemoglobin: 12.5 g/dL — ABNORMAL LOW (ref 13.0–17.0)
MCHC: 32.6 g/dL (ref 30.0–36.0)
MCV: 96.1 fl (ref 78.0–100.0)
Platelets: 184 10*3/uL (ref 150.0–400.0)
RBC: 3.99 Mil/uL — ABNORMAL LOW (ref 4.22–5.81)
RDW: 14 % (ref 11.5–15.5)
WBC: 2.7 10*3/uL — ABNORMAL LOW (ref 4.0–10.5)

## 2019-03-10 LAB — COMPREHENSIVE METABOLIC PANEL
ALT: 10 U/L (ref 0–53)
AST: 20 U/L (ref 0–37)
Albumin: 4.4 g/dL (ref 3.5–5.2)
Alkaline Phosphatase: 55 U/L (ref 39–117)
BUN: 25 mg/dL — ABNORMAL HIGH (ref 6–23)
CO2: 30 mEq/L (ref 19–32)
Calcium: 10.4 mg/dL (ref 8.4–10.5)
Chloride: 105 mEq/L (ref 96–112)
Creatinine, Ser: 1.39 mg/dL (ref 0.40–1.50)
GFR: 60.17 mL/min (ref >60.00)
Glucose, Bld: 85 mg/dL (ref 70–99)
Potassium: 5.1 mEq/L (ref 3.5–5.1)
Sodium: 142 mEq/L (ref 135–145)
Total Bilirubin: 0.6 mg/dL (ref 0.2–1.2)
Total Protein: 7.7 g/dL (ref 6.0–8.3)

## 2019-03-10 LAB — LIPID PANEL
Cholesterol: 209 mg/dL — ABNORMAL HIGH (ref 0–200)
HDL: 61.2 mg/dL (ref >39.00)
LDL Cholesterol: 127 mg/dL — ABNORMAL HIGH (ref 0–99)
NonHDL: 147.41
Total CHOL/HDL Ratio: 3
Triglycerides: 102 mg/dL (ref 0.0–149.0)
VLDL: 20.4 mg/dL (ref 0.0–40.0)

## 2019-03-10 LAB — PSA, MEDICARE: PSA: 2.33 ng/ml (ref 0.10–4.00)

## 2019-03-10 LAB — HEMOGLOBIN A1C: Hgb A1c MFr Bld: 6.3 % (ref 4.6–6.5)

## 2019-03-10 NOTE — Progress Notes (Signed)
Subjective:    Patient ID: Shane Kim, male    DOB: [birth year], 74 y.o.   MRN: [id number]  HPI  This visit occurred during the SARS-CoV-2 public health emergency.  Safety protocols were in place, including screening questions prior to the visit, additional usage of staff PPE, and extensive cleaning of exam room while observing appropriate contact time as indicated for disinfecting solutions.   Shane Kim is a 74 year old male who presents today for complete physical.  Immunizations: -Influenza: Completed this season -Shingles: Never completed, declines  -Pneumonia: Completed Prevnar in 2018 and Pneumovax in 2018  Diet: He endorses a fair diet.  Exercise: He is not exercising regularly   Eye exam: No recent exam Dental exam: Completed in 2020  Colonoscopy: Completed in 2020, was not told to return. PSA: Pending Hep C Screen: Negative  BP Readings from Last 3 Encounters:  03/10/19 108/72  02/14/19 100/74  11/29/18 109/77   BP Readings from Last 3 Encounters:  03/10/19 108/72  02/14/19 100/74  11/29/18 109/77     Review of Systems  Constitutional: Negative for unexpected weight change.  HENT: Negative for rhinorrhea.   Eyes: Negative for visual disturbance.  Respiratory: Negative for cough and shortness of breath.   Cardiovascular: Negative for chest pain.  Gastrointestinal: Negative for constipation and diarrhea.       Intermittent GERD  Genitourinary: Negative for difficulty urinating.  Musculoskeletal: Negative for arthralgias and myalgias.  Skin: Negative for rash.  Allergic/Immunologic: Negative for environmental allergies.  Neurological: Negative for dizziness, numbness and headaches.  Psychiatric/Behavioral: The patient is not nervous/anxious.        Past Medical History:  Diagnosis Date  . Acute systolic CHF (congestive heart failure) ([place])    a. echo 11/21/15: EF <20%, mildly dilated LV, severe diffuse global HK, GR3DD, mod MR, LA mildly dilated,  RV sys fxn mildly reduced, PASP 52 mmHg  . CAD (coronary artery disease)    a. cardiac cath 11/22/15: dLM 40%, pLAD 80-90%, mLAD sequential 40%, D2 90%, mLCx 90% at takeoff of OM1, RCA appeared occluded proximally, LVEF 20%, PCWP 29   . Chickenpox   . Chronic diastolic CHF (congestive heart failure) ([place])   . HLD (hyperlipidemia)   . Pre-diabetes   . Prostate cancer [facility])      Social History   Socioeconomic History  . Marital status: Married    Spouse name: Not on file  . Number of children: Not on file  . Years of education: Not on file  . Highest education level: Not on file  Occupational History  . Not on file  Tobacco Use  . Smoking status: Never Smoker  . Smokeless tobacco: Never Used  Substance and Sexual Activity  . Alcohol use: No  . Drug use: No  . Sexual activity: Not on file  Other Topics Concern  . Not on file  Social History Narrative   Married. Lives with his wife.   2 children, 2 grandchildren.   Retired. Worked as a Furniture conservator/restorer.   Enjoys fishing.    Social Determinants of Health   Financial Resource Strain: Low Risk   . Difficulty of Paying Living Expenses: Not hard at all  Food Insecurity: No Food Insecurity  . Worried About Charity fundraiser in the Last Year: Never true  . Ran Out of Food in the Last Year: Never true  Transportation Needs: No Transportation Needs  . Lack of Transportation (Medical): No  . Lack of Transportation (Non-Medical): No  Physical Activity: Inactive  . Days of Exercise per Week: 0 days  . Minutes of Exercise per Session: 0 min  Stress: No Stress Concern Present  . Feeling of Stress : Not at all  Social Connections:   . Frequency of Communication with Friends and Family: Not on file  . Frequency of Social Gatherings with Friends and Family: Not on file  . Attends Religious Services: Not on file  . Active Member of Clubs or Organizations: Not on file  . Attends Archivist Meetings: Not on file  . Marital Status:  Not on file  Intimate Partner Violence: Not At Risk  . Fear of Current or Ex-Partner: No  . Emotionally Abused: No  . Physically Abused: No  . Sexually Abused: No    Past Surgical History:  Procedure Laterality Date  . CARDIAC CATHETERIZATION N/A 11/22/2015   Procedure: Right/Left Heart Cath and Coronary Angiography;  Surgeon: Minna Merritts, MD;  Location: [place] [facility];  Service: Cardiovascular;  Laterality: N/A;  . CARDIAC CATHETERIZATION Left 11/29/2015   Procedure: LEFT FEMORAL ARTERIAL LINE INSERTION;  Surgeon: Gaye Pollack, MD;  Location: [place];  Service: Open Heart Surgery;  Laterality: Left;  . COLONOSCOPY WITH PROPOFOL N/A 02/14/2019   Procedure: COLONOSCOPY WITH PROPOFOL;  Surgeon: Toledo, Benay Pike, MD;  Location: [facility];  Service: Gastroenterology;  Laterality: N/A;  . CORONARY ARTERY BYPASS GRAFT N/A 11/29/2015   Procedure: CORONARY ARTERY BYPASS GRAFTING (CABG) x four, using left internal mammary artery and right leg saphenous vein harvested endoscopically;  Surgeon: Gaye Pollack, MD;  Location: [place] [facility];  Service: Open Heart Surgery;  Laterality: N/A;  . TEE WITHOUT CARDIOVERSION N/A 11/29/2015   Procedure: TRANSESOPHAGEAL ECHOCARDIOGRAM (TEE);  Surgeon: Gaye Pollack, MD;  Location: [place];  Service: Open Heart Surgery;  Laterality: N/A;  . TURP VAPORIZATION      Family History  Problem Relation Age of Onset  . Hypertension Mother   . Healthy Father     No Known Allergies  Current Outpatient Medications on File Prior to Visit  Medication Sig Dispense Refill  . ASPIRIN LOW DOSE 81 MG EC tablet TAKE 1 TABLET(81 MG) BY MOUTH DAILY. SWALLOW WHOLE 90 tablet 3  . carvedilol (COREG) 6.25 MG tablet TAKE 1 AND 1/2 TABLET BY MOUTH TWICE DAILY (Patient taking differently: 6.25 mg. ) 270 tablet 3  . finasteride (PROSCAR) 5 MG tablet TAKE 1 TABLET(5 MG) BY MOUTH DAILY 90 tablet 0  . losartan (COZAAR) 25 MG tablet Take 1 tablet (25 mg total) by mouth daily. 90  tablet 3  . rosuvastatin (CRESTOR) 40 MG tablet Take 1 tablet (40 mg total) by mouth daily. For cholesterol. 90 tablet 1  . spironolactone (ALDACTONE) 25 MG tablet Take 1 tablet (25 mg total) by mouth daily. 90 tablet 0   No current facility-administered medications on file prior to visit.    BP 108/72   Pulse 78   Temp 97.9 F (36.6 C)   Ht 6' (1.829 m)   Wt 236 lb 12.8 oz (107.4 kg)   SpO2 97%   BMI 32.12 kg/m    Objective:   Physical Exam  Constitutional: He is oriented to person, place, and time. He appears well-nourished.  HENT:  Right Ear: Tympanic membrane and ear canal normal.  Left Ear: Tympanic membrane and ear canal normal.  Mouth/Throat: Oropharynx is clear and moist.  Eyes: Pupils are equal, round, and reactive to light. EOM are normal.  Cardiovascular: Normal  rate and regular rhythm.  Respiratory: Effort normal and breath sounds normal.  GI: Soft. Bowel sounds are normal. There is no abdominal tenderness.  Musculoskeletal:        General: Normal range of motion.     Cervical back: Neck supple.  Neurological: He is alert and oriented to person, place, and time. No cranial nerve deficit.  Reflex Scores:      Patellar reflexes are 2+ on the right side and 2+ on the left side. Skin: Skin is warm and dry.  Psychiatric: He has a normal mood and affect.           Assessment & Plan:

## 2019-03-10 NOTE — Assessment & Plan Note (Signed)
Compliant to rosuvastatin, repeat lipids pending.

## 2019-03-10 NOTE — Assessment & Plan Note (Signed)
Doing well on finasteride, continue same.

## 2019-03-10 NOTE — Assessment & Plan Note (Signed)
Chronic and asymptomatic. Repeat CBC pending.

## 2019-03-10 NOTE — Assessment & Plan Note (Signed)
Repeat A1C pending. 

## 2019-03-10 NOTE — Assessment & Plan Note (Signed)
Pneumonia vaccinations UTD, declines Shingrix. PSA pending. Colonoscopy UTD. Encouraged regular exercise, healthy diet. Exam today stable. Labs pending.

## 2019-03-10 NOTE — Assessment & Plan Note (Signed)
Colonoscopy completed in late 2020. Reviewed report.

## 2019-03-10 NOTE — Patient Instructions (Signed)
Stop by the lab prior to leaving today. I will notify you of your results once received.   Start exercising. You should be getting 150 minutes of exercise weekly.  It's important to improve your diet by reducing consumption of fast food, fried food, processed snack foods, sugary drinks. Increase consumption of fresh vegetables and fruits, whole grains, water.  Ensure you are drinking 64 ounces of water daily.  Schedule a follow up visit with your cardiologist for around March 2021.  It was a pleasure to see you today!   Preventive Care 76 Years and Older, Male Preventive care refers to lifestyle choices and visits with your health care provider that can promote health and wellness. This includes:  A yearly physical exam. This is also called an annual well check.  Regular dental and eye exams.  Immunizations.  Screening for certain conditions.  Healthy lifestyle choices, such as diet and exercise. What can I expect for my preventive care visit? Physical exam Your health care provider will check:  Height and weight. These may be used to calculate body mass index (BMI), which is a measurement that tells if you are at a healthy weight.  Heart rate and blood pressure.  Your skin for abnormal spots. Counseling Your health care provider may ask you questions about:  Alcohol, tobacco, and drug use.  Emotional well-being.  Home and relationship well-being.  Sexual activity.  Eating habits.  History of falls.  Memory and ability to understand (cognition).  Work and work Statistician. What immunizations do I need?  Influenza (flu) vaccine  This is recommended every year. Tetanus, diphtheria, and pertussis (Tdap) vaccine  You may need a Td booster every 10 years. Varicella (chickenpox) vaccine  You may need this vaccine if you have not already been vaccinated. Zoster (shingles) vaccine  You may need this after age 19. Pneumococcal conjugate (PCV13) vaccine  One  dose is recommended after age 72. Pneumococcal polysaccharide (PPSV23) vaccine  One dose is recommended after age 19. Measles, mumps, and rubella (MMR) vaccine  You may need at least one dose of MMR if you were born in 1957 or later. You may also need a second dose. Meningococcal conjugate (MenACWY) vaccine  You may need this if you have certain conditions. Hepatitis A vaccine  You may need this if you have certain conditions or if you travel or work in places where you may be exposed to hepatitis A. Hepatitis B vaccine  You may need this if you have certain conditions or if you travel or work in places where you may be exposed to hepatitis B. Haemophilus influenzae type b (Hib) vaccine  You may need this if you have certain conditions. You may receive vaccines as individual doses or as more than one vaccine together in one shot (combination vaccines). Talk with your health care provider about the risks and benefits of combination vaccines. What tests do I need? Blood tests  Lipid and cholesterol levels. These may be checked every 5 years, or more frequently depending on your overall health.  Hepatitis C test.  Hepatitis B test. Screening  Lung cancer screening. You may have this screening every year starting at age 35 if you have a 30-pack-year history of smoking and currently smoke or have quit within the past 15 years.  Colorectal cancer screening. All adults should have this screening starting at age 45 and continuing until age 11. Your health care provider may recommend screening at age 62 if you are at increased risk. You  will have tests every 1-10 years, depending on your results and the type of screening test.  Prostate cancer screening. Recommendations will vary depending on your family history and other risks.  Diabetes screening. This is done by checking your blood sugar (glucose) after you have not eaten for a while (fasting). You may have this done every 1-3  years.  Abdominal aortic aneurysm (AAA) screening. You may need this if you are a current or former smoker.  Sexually transmitted disease (STD) testing. Follow these instructions at home: Eating and drinking  Eat a diet that includes fresh fruits and vegetables, whole grains, lean protein, and low-fat dairy products. Limit your intake of foods with high amounts of sugar, saturated fats, and salt.  Take vitamin and mineral supplements as recommended by your health care provider.  Do not drink alcohol if your health care provider tells you not to drink.  If you drink alcohol: ? Limit how much you have to 0-2 drinks a day. ? Be aware of how much alcohol is in your drink. In the [place] one drink equals one 12 oz bottle of beer (355 mL), one 5 oz glass of wine (148 mL), or one 1 oz glass of hard liquor (44 mL). Lifestyle  Take daily care of your teeth and gums.  Stay active. Exercise for at least 30 minutes on 5 or more days each week.  Do not use any products that contain nicotine or tobacco, such as cigarettes, e-cigarettes, and chewing tobacco. If you need help quitting, ask your health care provider.  If you are sexually active, practice safe sex. Use a condom or other form of protection to prevent STIs (sexually transmitted infections).  Talk with your health care provider about taking a low-dose aspirin or statin. What's next?  Visit your health care provider once a year for a well check visit.  Ask your health care provider how often you should have your eyes and teeth checked.  Stay up to date on all vaccines. This information is not intended to replace advice given to you by your health care provider. Make sure you discuss any questions you have with your health care provider. Document Revised: 02/04/2018 Document Reviewed: 02/04/2018 Elsevier Patient Education  2020 [employer].

## 2019-03-10 NOTE — Assessment & Plan Note (Signed)
PSA pending

## 2019-03-10 NOTE — Assessment & Plan Note (Signed)
Appears euvolemic, continue current reigmen. Would recommend reducing/eliminating one of his antihypertensives used for CHF treatment. ARB?

## 2019-03-10 NOTE — Assessment & Plan Note (Signed)
Asymptomatic. Following with cardiology, continue current regimen. Would recommend reduction in one of his hypertension medications, will defer to cardiology.

## 2019-03-10 NOTE — Assessment & Plan Note (Deleted)
Chronic and asymptomatic. Repeat CBC pending.

## 2019-03-11 ENCOUNTER — Other Ambulatory Visit: Payer: Self-pay | Admitting: Primary Care

## 2019-03-11 DIAGNOSIS — I255 Ischemic cardiomyopathy: Secondary | ICD-10-CM

## 2019-03-11 DIAGNOSIS — I5022 Chronic systolic (congestive) heart failure: Secondary | ICD-10-CM

## 2019-03-11 DIAGNOSIS — I25119 Atherosclerotic heart disease of native coronary artery with unspecified angina pectoris: Secondary | ICD-10-CM

## 2019-03-25 ENCOUNTER — Other Ambulatory Visit (HOSPITAL_COMMUNITY): Payer: Self-pay

## 2019-03-25 ENCOUNTER — Other Ambulatory Visit: Payer: Self-pay | Admitting: Primary Care

## 2019-03-25 DIAGNOSIS — I420 Dilated cardiomyopathy: Secondary | ICD-10-CM

## 2019-03-25 MED ORDER — SPIRONOLACTONE 25 MG PO TABS: 25.0000 mg | ORAL_TABLET | Freq: Every day | ORAL | 3 refills | Status: DC

## 2019-03-25 MED ORDER — LOSARTAN POTASSIUM 25 MG PO TABS: 25.0000 mg | ORAL_TABLET | Freq: Every day | ORAL | 3 refills | Status: DC

## 2019-04-08 ENCOUNTER — Other Ambulatory Visit: Payer: Self-pay | Admitting: Primary Care

## 2019-04-08 DIAGNOSIS — C61 Malignant neoplasm of prostate: Secondary | ICD-10-CM

## 2019-04-26 ENCOUNTER — Other Ambulatory Visit: Payer: Self-pay

## 2019-04-26 ENCOUNTER — Encounter: Payer: Self-pay | Admitting: Cardiology

## 2019-04-26 ENCOUNTER — Ambulatory Visit: Payer: Medicare PPO | Admitting: Cardiology

## 2019-04-26 ENCOUNTER — Encounter (INDEPENDENT_AMBULATORY_CARE_PROVIDER_SITE_OTHER): Payer: Self-pay

## 2019-04-26 VITALS — BP 110/70 | HR 78 | Ht 72.0 in | Wt 230.6 lb

## 2019-04-26 DIAGNOSIS — I2510 Atherosclerotic heart disease of native coronary artery without angina pectoris: Secondary | ICD-10-CM

## 2019-04-26 DIAGNOSIS — I5022 Chronic systolic (congestive) heart failure: Secondary | ICD-10-CM | POA: Diagnosis not present

## 2019-04-26 DIAGNOSIS — Z951 Presence of aortocoronary bypass graft: Secondary | ICD-10-CM | POA: Diagnosis not present

## 2019-04-26 DIAGNOSIS — I420 Dilated cardiomyopathy: Secondary | ICD-10-CM

## 2019-04-26 MED ORDER — SPIRONOLACTONE 25 MG PO TABS: 12.5000 mg | ORAL_TABLET | Freq: Every day | ORAL | 3 refills | Status: DC

## 2019-04-26 NOTE — Patient Instructions (Signed)
Medication Instructions:  Please decrease your Spironolactone to 12.5 mg once a day. Continue all other medications as listed.  *If you need a refill on your cardiac medications before your next appointment, please call your pharmacy*  Follow-Up: At [facility], you and your health needs are our priority.  As part of our continuing mission to provide you with exceptional heart care, we have created designated Provider Care Teams.  These Care Teams include your primary Cardiologist (physician) and Advanced Practice Providers (APPs -  Physician Assistants and Nurse Practitioners) who all work together to provide you with the care you need, when you need it.  We recommend signing up for the patient portal called "MyChart".  Sign up information is provided on this After Visit Summary.  MyChart is used to connect with patients for Virtual Visits (Telemedicine).  Patients are able to view lab/test results, encounter notes, upcoming appointments, etc.  Non-urgent messages can be sent to your provider as well.   To learn more about what you can do with MyChart, go to [email or web address].    Your next appointment:   6 month(s)  The format for your next appointment:   In Person  Provider:   Truitt Merle, NP and 1 year with Dr Marlou Porch.   Thank you for choosing [facility]!!

## 2019-04-26 NOTE — Progress Notes (Signed)
Cardiology Office Note:    Date:  04/26/2019   ID:  Shane Kim, DOB 01/28/1946, MRN [id number]  PCP:  Pleas Koch, NP  Cardiologist:  Candee Furbish, MD  Electrophysiologist:  None   Referring MD: Pleas Koch, NP     History of Present Illness:    Shane Kim is a 74 y.o. male here for new patient visit, previously seen by the advanced heart failure clinic, Dr. Haroldine Laws with prior severe left ventricular dysfunction EF less than 20% back in 2017.  Cardiac cath demonstrated severe three-vessel coronary disease with 90% proximal LAD occlusion of large OM with collaterals filling the distal vessel and an occluded proximal RCA with collaterals filling the PDA.  Subsequently underwent CABG in 2017 by Dr. Cyndia Bent.  LIMA to LAD sequential SVG to second diagonal and obtuse marginal, SVG to RCA.  Sinus tachycardia was noted, ivabradine was started.  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  Prior LDL 105, Crestor was increased.  Echo improved from 20% up to 50%    Past Medical History:  Diagnosis Date  . Acute systolic CHF (congestive heart failure) ([place])    a. echo 11/21/15: EF <20%, mildly dilated LV, severe diffuse global HK, GR3DD, mod MR, LA mildly dilated, RV sys fxn mildly reduced, PASP 52 mmHg  . CAD (coronary artery disease)    a. cardiac cath 11/22/15: dLM 40%, pLAD 80-90%, mLAD sequential 40%, D2 90%, mLCx 90% at takeoff of OM1, RCA appeared occluded proximally, LVEF 20%, PCWP 29   . Chickenpox   . Chronic diastolic CHF (congestive heart failure) ([place])   . HLD (hyperlipidemia)   . Pre-diabetes   . Prostate cancer [facility])     Past Surgical History:  Procedure Laterality Date  . CARDIAC CATHETERIZATION N/A 11/22/2015   Procedure: Right/Left Heart Cath and Coronary Angiography;  Surgeon: Minna Merritts, MD;  Location: [place] [facility];  Service:  Cardiovascular;  Laterality: N/A;  . CARDIAC CATHETERIZATION Left 11/29/2015   Procedure: LEFT FEMORAL ARTERIAL LINE INSERTION;  Surgeon: Gaye Pollack, MD;  Location: [place];  Service: Open Heart Surgery;  Laterality: Left;  . COLONOSCOPY WITH PROPOFOL N/A 02/14/2019   Procedure: COLONOSCOPY WITH PROPOFOL;  Surgeon: Toledo, Benay Pike, MD;  Location: [facility];  Service: Gastroenterology;  Laterality: N/A;  . CORONARY ARTERY BYPASS GRAFT N/A 11/29/2015   Procedure: CORONARY ARTERY BYPASS GRAFTING (CABG) x four, using left internal mammary artery and right leg saphenous vein harvested endoscopically;  Surgeon: Gaye Pollack, MD;  Location: [place] [facility];  Service: Open Heart Surgery;  Laterality: N/A;  . TEE WITHOUT CARDIOVERSION N/A 11/29/2015   Procedure: TRANSESOPHAGEAL ECHOCARDIOGRAM (TEE);  Surgeon: Gaye Pollack, MD;  Location: [place];  Service: Open Heart Surgery;  Laterality: N/A;  . TURP VAPORIZATION      Current Medications: Current Meds  Medication Sig  . ASPIRIN LOW DOSE 81 MG EC tablet TAKE 1 TABLET(81 MG) BY MOUTH DAILY. SWALLOW WHOLE  . carvedilol (COREG) 6.25 MG tablet TAKE 1 AND 1/2 TABLET BY MOUTH TWICE DAILY  . finasteride (PROSCAR) 5 MG tablet TAKE 1 TABLET(5 MG) BY MOUTH DAILY  . losartan (COZAAR) 25 MG tablet Take 1 tablet (25 mg total) by mouth daily.  . rosuvastatin (CRESTOR) 40 MG tablet Take 1 tablet (40 mg total) by mouth daily. For cholesterol.  Marland Kitchen spironolactone (ALDACTONE) 25 MG tablet Take  0.5 tablets (12.5 mg total) by mouth daily.  . [DISCONTINUED] spironolactone (ALDACTONE) 25 MG tablet Take 1 tablet (25 mg total) by mouth daily.     Allergies:   Patient has no known allergies.   Social History   Socioeconomic History  . Marital status: Married    Spouse name: Not on file  . Number of children: Not on file  . Years of education: Not on file  . Highest education level: Not on file  Occupational History  . Not on file  Tobacco Use  . Smoking status: Never  Smoker  . Smokeless tobacco: Never Used  Substance and Sexual Activity  . Alcohol use: No  . Drug use: No  . Sexual activity: Not on file  Other Topics Concern  . Not on file  Social History Narrative   Married. Lives with his wife.   2 children, 2 grandchildren.   Retired. Worked as a Furniture conservator/restorer.   Enjoys fishing.    Social Determinants of Health   Financial Resource Strain: Low Risk   . Difficulty of Paying Living Expenses: Not hard at all  Food Insecurity: No Food Insecurity  . Worried About Charity fundraiser in the Last Year: Never true  . Ran Out of Food in the Last Year: Never true  Transportation Needs: No Transportation Needs  . Lack of Transportation (Medical): No  . Lack of Transportation (Non-Medical): No  Physical Activity: Inactive  . Days of Exercise per Week: 0 days  . Minutes of Exercise per Session: 0 min  Stress: No Stress Concern Present  . Feeling of Stress : Not at all  Social Connections:   . Frequency of Communication with Friends and Family: Not on file  . Frequency of Social Gatherings with Friends and Family: Not on file  . Attends Religious Services: Not on file  . Active Member of Clubs or Organizations: Not on file  . Attends Archivist Meetings: Not on file  . Marital Status: Not on file     Family History: The patient's family history includes Healthy in his father; Hypertension in his mother.  ROS:   Please see the history of present illness.    No fevers chills nausea vomiting syncope bleeding all other systems reviewed and are negative.  EKGs/Labs/Other Studies Reviewed:    The following studies were reviewed today: Prior echocardiogram, catheterization results, op report for CABG  EKG:  EKG is  ordered today.  The ekg ordered today demonstrates sinus rhythm 78, subtle J-point elevation V2.  Recent Labs: 03/10/2019: ALT 10; BUN 25; Creatinine, Ser 1.39; Hemoglobin 12.5; Platelets 184.0; Potassium 5.1; Sodium 142  Recent  Lipid Panel    Component Value Date/Time   CHOL 209 (H) 03/10/2019 1114   TRIG 102.0 03/10/2019 1114   HDL 61.20 03/10/2019 1114   CHOLHDL 3 03/10/2019 1114   VLDL 20.4 03/10/2019 1114   LDLCALC 127 (H) 03/10/2019 1114    Physical Exam:    VS:  BP 110/70   Pulse 78   Ht 6' (1.829 m)   Wt 230 lb 9.6 oz (104.6 kg)   SpO2 97%   BMI 31.27 kg/m     Wt Readings from Last 3 Encounters:  04/26/19 230 lb 9.6 oz (104.6 kg)  03/10/19 236 lb 12.8 oz (107.4 kg)  02/14/19 240 lb (108.9 kg)     GEN:  Well nourished, well developed in no acute distress HEENT: Normal NECK: No JVD; No carotid bruits LYMPHATICS: No lymphadenopathy CARDIAC:  RRR, no murmurs, rubs, gallops RESPIRATORY:  Clear to auscultation without rales, wheezing or rhonchi  ABDOMEN: Soft, non-tender, non-distended MUSCULOSKELETAL:  No edema; No deformity  SKIN: Warm and dry NEUROLOGIC:  Alert and oriented x 3 PSYCHIATRIC:  Normal affect   ASSESSMENT:    1. Chronic systolic heart failure ([place])   2. CAD in native artery   3. S/P CABG x 4   4. Congestive dilated cardiomyopathy (H[facility])    PLAN:    In order of problems listed above:  Coronary disease -CABG times 4 on 11/29/15 -Aspirin, statin with goal LDL less than 70.  Doing well no ischemic changes.  No angina.  Chronic systolic heart failure secondary to ischemic cardiomyopathy -Prior EF 20%, most recent EF 45 to 50%.  Excellent.  NYHA class I -Pressure has been somewhat soft.  There is consideration at his prior office visit on 03/10/2019 by Jerrel Ivory, NP to reduce one of his medications, she deferred to [place].  He can cut the spironolactone in half to 12.5 mg once a day.  I would like to try to continue with the beta-blocker, angiotensin receptor blocker at low doses and spironolactone at low-dose given his prior cardiomyopathy.  He really is only feeling some mild dizziness if he jumps up very quickly. -Creatinine 1.39 potassium 5.1, hemoglobin  12.5  Hyperlipidemia -Crestor with goal LDL less than 70.  Previously had increase the Crestor.  LDL 127 on 03/10/2019. Crestor now is at 40 mg a day.  Next step may be to add Zetia to try to get his LDL less than 70.  Prior ALT was 10.  Medication Adjustments/Labs and Tests Ordered: Current medicines are reviewed at length with the patient today.  Concerns regarding medicines are outlined above.  Orders Placed This Encounter  Procedures  . EKG 12-Lead   Meds ordered this encounter  Medications  . spironolactone (ALDACTONE) 25 MG tablet    Sig: Take 0.5 tablets (12.5 mg total) by mouth daily.    Dispense:  45 tablet    Refill:  3    Dose decrease to 12.5 mg daily    Patient Instructions  Medication Instructions:  Please decrease your Spironolactone to 12.5 mg once a day. Continue all other medications as listed.  *If you need a refill on your cardiac medications before your next appointment, please call your pharmacy*  Follow-Up: At [facility], you and your health needs are our priority.  As part of our continuing mission to provide you with exceptional heart care, we have created designated Provider Care Teams.  These Care Teams include your primary Cardiologist (physician) and Advanced Practice Providers (APPs -  Physician Assistants and Nurse Practitioners) who all work together to provide you with the care you need, when you need it.  We recommend signing up for the patient portal called "MyChart".  Sign up information is provided on this After Visit Summary.  MyChart is used to connect with patients for Virtual Visits (Telemedicine).  Patients are able to view lab/test results, encounter notes, upcoming appointments, etc.  Non-urgent messages can be sent to your provider as well.   To learn more about what you can do with MyChart, go to [email or web address].    Your next appointment:   6 month(s)  The format for your next appointment:   In Person  Provider:   Truitt Merle, NP and 1 year with Dr Marlou Porch.   Thank you for choosing [facility]!!  Signed, Candee Furbish, MD  04/26/2019 10:00 AM    [facility]

## 2019-09-05 ENCOUNTER — Other Ambulatory Visit: Payer: Self-pay | Admitting: Primary Care

## 2019-09-05 DIAGNOSIS — E785 Hyperlipidemia, unspecified: Secondary | ICD-10-CM

## 2019-10-03 ENCOUNTER — Other Ambulatory Visit (HOSPITAL_COMMUNITY): Payer: Self-pay

## 2019-10-03 DIAGNOSIS — I5022 Chronic systolic (congestive) heart failure: Secondary | ICD-10-CM

## 2019-10-03 MED ORDER — CARVEDILOL 6.25 MG PO TABS: 9.3750 mg | ORAL_TABLET | Freq: Two times a day (BID) | ORAL | 0 refills | Status: DC

## 2019-10-17 ENCOUNTER — Other Ambulatory Visit (HOSPITAL_COMMUNITY): Payer: Self-pay

## 2019-10-17 MED ORDER — ASPIRIN 81 MG PO TBEC: DELAYED_RELEASE_TABLET | ORAL | 3 refills | Status: DC

## 2019-10-17 NOTE — Progress Notes (Signed)
CARDIOLOGY OFFICE NOTE  Date:  10/24/2019    Shane Kim Date of Birth: 1945/08/08 Medical Record #[id number]  PCP:  Pleas Koch, NP  Cardiologist:  [facility]  Chief Complaint  Patient presents with  . Follow-up    Seen for Dr. Marlou Porch    History of Present Illness: Shane Kim is a 74 y.o. male who presents today for a 4 month check. Seen for Dr. Marlou Porch.   Previously followed by Dr. Haroldine Laws in the CHF clinic.  He has a history of prior severe left ventricular dysfunction EF less than 20% back in 2017.  Cardiac cath demonstrated severe three-vessel coronary disease with 90% proximal LAD occlusion of large OM with collaterals filling the distal vessel and an occluded proximal RCA with collaterals filling the PDA.  Subsequently underwent CABG in 2017 by Dr. Cyndia Bent with LIMA to LAD, sequential SVG to second diagonal and obtuse marginal, and SVG to RCA. He is on ivabradine for sinus tach.   Last seen by Dr. Marlou Porch back in March - felt to be doing well. EF has improved to 50%.   Comes in today. Here alone. He is down about 16 pounds since January. He says he "was told to walk" so he has been walking. Sounds like he has cut back on his intake as well. Feels good. Not dizzy. No chest pain. Not short of breath. Tolerating his medicines. He is working as a Lexicographer. He really has no concerns. He has been vaccinated for COVID 19.   Past Medical History:  Diagnosis Date  . Acute systolic CHF (congestive heart failure) ([place])    a. echo 11/21/15: EF <20%, mildly dilated LV, severe diffuse global HK, GR3DD, mod MR, LA mildly dilated, RV sys fxn mildly reduced, PASP 52 mmHg  . CAD (coronary artery disease)    a. cardiac cath 11/22/15: dLM 40%, pLAD 80-90%, mLAD sequential 40%, D2 90%, mLCx 90% at takeoff of OM1, RCA appeared occluded proximally, LVEF 20%, PCWP 29   . Chickenpox   . Chronic diastolic CHF (congestive heart failure) ([place])   . HLD (hyperlipidemia)   .  Pre-diabetes   . Prostate cancer [facility])     Past Surgical History:  Procedure Laterality Date  . CARDIAC CATHETERIZATION N/A 11/22/2015   Procedure: Right/Left Heart Cath and Coronary Angiography;  Surgeon: Minna Merritts, MD;  Location: [place] [facility];  Service: Cardiovascular;  Laterality: N/A;  . CARDIAC CATHETERIZATION Left 11/29/2015   Procedure: LEFT FEMORAL ARTERIAL LINE INSERTION;  Surgeon: Gaye Pollack, MD;  Location: [place];  Service: Open Heart Surgery;  Laterality: Left;  . COLONOSCOPY WITH PROPOFOL N/A 02/14/2019   Procedure: COLONOSCOPY WITH PROPOFOL;  Surgeon: Toledo, Benay Pike, MD;  Location: [facility];  Service: Gastroenterology;  Laterality: N/A;  . CORONARY ARTERY BYPASS GRAFT N/A 11/29/2015   Procedure: CORONARY ARTERY BYPASS GRAFTING (CABG) x four, using left internal mammary artery and right leg saphenous vein harvested endoscopically;  Surgeon: Gaye Pollack, MD;  Location: [place] [facility];  Service: Open Heart Surgery;  Laterality: N/A;  . TEE WITHOUT CARDIOVERSION N/A 11/29/2015   Procedure: TRANSESOPHAGEAL ECHOCARDIOGRAM (TEE);  Surgeon: Gaye Pollack, MD;  Location: [place];  Service: Open Heart Surgery;  Laterality: N/A;  . TURP VAPORIZATION       Medications: Current Meds  Medication Sig  . aspirin (ASPIRIN LOW DOSE) 81 MG EC tablet TAKE 1 TABLET(81 MG) BY MOUTH DAILY. SWALLOW WHOLE  . carvedilol (COREG) 6.25 MG tablet Take  1.5 tablets (9.375 mg total) by mouth 2 (two) times daily with a meal.  . finasteride (PROSCAR) 5 MG tablet TAKE 1 TABLET(5 MG) BY MOUTH DAILY  . losartan (COZAAR) 25 MG tablet Take 1 tablet (25 mg total) by mouth daily.  . rosuvastatin (CRESTOR) 40 MG tablet TAKE 1 TABLET(40 MG) BY MOUTH DAILY FOR CHOLESTEROL  . spironolactone (ALDACTONE) 25 MG tablet Take 0.5 tablets (12.5 mg total) by mouth daily.  . [DISCONTINUED] carvedilol (COREG) 6.25 MG tablet Take 1.5 tablets (9.375 mg total) by mouth 2 (two) times daily with a meal. Must be seen  in office for further refills     Allergies: No Known Allergies  Social History: The patient  reports that he has never smoked. He has never used smokeless tobacco. He reports that he does not drink alcohol and does not use drugs.   Family History: The patient's family history includes Healthy in his father; Hypertension in his mother.   Review of Systems: Please see the history of present illness.   All other systems are reviewed and negative.   Physical Exam: VS:  BP 90/64 Comment: 90/64 in Right arm @ 9:56am  Pulse 74   Ht 6\' 3"  (1.905 m)   Wt 220 lb 3.2 oz (99.9 kg)   SpO2 97%   BMI 27.52 kg/m  .  BMI Body mass index is 27.52 kg/m.  Wt Readings from Last 3 Encounters:  10/24/19 220 lb 3.2 oz (99.9 kg)  04/26/19 230 lb 9.6 oz (104.6 kg)  03/10/19 236 lb 12.8 oz (107.4 kg)    General: Pleasant. Alert and in no acute distress.   Cardiac: Regular rate and rhythm. No murmurs, rubs, or gallops. No edema.  Respiratory:  Lungs are clear to auscultation bilaterally with normal work of breathing.  GI: Soft and nontender.  MS: No deformity or atrophy. Gait and ROM intact.  Skin: Warm and dry. Color is normal.  Neuro:  Strength and sensation are intact and no gross focal deficits noted.  Psych: Alert, appropriate and with normal affect.   LABORATORY DATA:  EKG:  EKG is ordered today.    Lab Results  Component Value Date   WBC 2.7 (L) 03/10/2019   HGB 12.5 (L) 03/10/2019   HCT 38.3 (L) 03/10/2019   PLT 184.0 03/10/2019   GLUCOSE 85 03/10/2019   CHOL 209 (H) 03/10/2019   TRIG 102.0 03/10/2019   HDL 61.20 03/10/2019   LDLCALC 127 (H) 03/10/2019   ALT 10 03/10/2019   AST 20 03/10/2019   NA 142 03/10/2019   K 5.1 03/10/2019   CL 105 03/10/2019   CREATININE 1.39 03/10/2019   BUN 25 (H) 03/10/2019   CO2 30 03/10/2019   TSH 1.386 11/21/2015   PSA 2.33 03/10/2019   INR 1.30 11/29/2015   HGBA1C 6.3 03/10/2019     BNP (last 3 results) No results for input(s): BNP  in the last 8760 hours.  ProBNP (last 3 results) No results for input(s): PROBNP in the last 8760 hours.   Other Studies Reviewed Today:  ECHO IMPRESSIONS March 2020  1. The left ventricle has mildly reduced systolic function, with an  ejection fraction of 45-50%. The cavity size was mildly dilated. There is  mildly increased left ventricular wall thickness. Left ventricular  diastolic parameters were normal.  2. Mid and basal inferior wall akinesis distal septal hypokinesis.  3. The right ventricle has normal systolic function. The cavity was  normal. There is no increase in right ventricular  wall thickness.  4. Left atrial size was mildly dilated.  5. The mitral valve is degenerative. Mild thickening of the mitral valve  leaflet. Mild calcification of the mitral valve leaflet. There is moderate  mitral annular calcification present.  6. The tricuspid valve is normal in structure.  7. The aortic valve is tricuspid Severely thickening of the aortic valve  Sclerosis without any evidence of stenosis of the aortic valve. Aortic  valve regurgitation is trivial by color flow Doppler.  8. The pulmonic valve was grossly normal. Pulmonic valve regurgitation is  mild by color flow Doppler.  9. The aortic root is normal in size and structure.    ASSESSMENT & PLAN:    1. Chronic systolic HF - he has had recovery of his EF by last echo - 45 to 50% - he is doing well - NYHA I. On low dose medicines that include Coreg, Losartan and Aldactone. Needs labs today. He is felt to be doing well.   2. CAD with remote CABG x 4 in 2017 - no worrisome symptoms. He is doing well with CV risk factor modification. On low dose aspirin.   3. Prior CABG - see above.   4. HLD - on statin - needs labs. To consider Zetia per prior note.   5. HTN - BP is great - little soft but he is totally asymptomatic. Would favor continuing.   Current medicines are reviewed with the patient today.  The patient  does not have concerns regarding medicines other than what has been noted above.  The following changes have been made:  See above.  Labs/ tests ordered today include:    Orders Placed This Encounter  Procedures  . Basic metabolic panel  . CBC  . Hepatic function panel  . Lipid panel     Disposition:   FU with Dr. Marlou Porch in 6 months.      Patient is agreeable to this plan and will call if any problems develop in the interim.   SignedTruitt Merle, NP  10/24/2019 10:08 AM  [place] [facility] [address] [place], [place]  [address] Phone: [phone] Fax: [phone]

## 2019-10-24 ENCOUNTER — Other Ambulatory Visit: Payer: Self-pay

## 2019-10-24 ENCOUNTER — Ambulatory Visit: Payer: Medicare PPO | Admitting: Nurse Practitioner

## 2019-10-24 ENCOUNTER — Encounter: Payer: Self-pay | Admitting: Nurse Practitioner

## 2019-10-24 VITALS — BP 90/64 | HR 74 | Ht 75.0 in | Wt 220.2 lb

## 2019-10-24 DIAGNOSIS — E7849 Other hyperlipidemia: Secondary | ICD-10-CM

## 2019-10-24 DIAGNOSIS — I5022 Chronic systolic (congestive) heart failure: Secondary | ICD-10-CM | POA: Diagnosis not present

## 2019-10-24 DIAGNOSIS — I2510 Atherosclerotic heart disease of native coronary artery without angina pectoris: Secondary | ICD-10-CM

## 2019-10-24 DIAGNOSIS — Z951 Presence of aortocoronary bypass graft: Secondary | ICD-10-CM

## 2019-10-24 LAB — HEPATIC FUNCTION PANEL
ALT: 10 IU/L (ref 0–44)
AST: 20 IU/L (ref 0–40)
Albumin: 4.5 g/dL (ref 3.7–4.7)
Alkaline Phosphatase: 65 IU/L (ref 48–121)
Bilirubin Total: 0.4 mg/dL (ref 0.0–1.2)
Bilirubin, Direct: 0.12 mg/dL (ref 0.00–0.40)
Total Protein: 7.5 g/dL (ref 6.0–8.5)

## 2019-10-24 LAB — BASIC METABOLIC PANEL
BUN/Creatinine Ratio: 14 (ref 10–24)
BUN: 24 mg/dL (ref 8–27)
CO2: 22 mmol/L (ref 20–29)
Calcium: 10.3 mg/dL — ABNORMAL HIGH (ref 8.6–10.2)
Chloride: 104 mmol/L (ref 96–106)
Creatinine, Ser: 1.66 mg/dL — ABNORMAL HIGH (ref 0.76–1.27)
GFR calc Af Amer: 46 mL/min/{1.73_m2} — ABNORMAL LOW (ref >59)
GFR calc non Af Amer: 39 mL/min/{1.73_m2} — ABNORMAL LOW (ref >59)
Glucose: 100 mg/dL — ABNORMAL HIGH (ref 65–99)
Potassium: 4.9 mmol/L (ref 3.5–5.2)
Sodium: 140 mmol/L (ref 134–144)

## 2019-10-24 LAB — LIPID PANEL
Chol/HDL Ratio: 3.4 ratio (ref 0.0–5.0)
Cholesterol, Total: 197 mg/dL (ref 100–199)
HDL: 58 mg/dL (ref >39)
LDL Chol Calc (NIH): 124 mg/dL — ABNORMAL HIGH (ref 0–99)
Triglycerides: 80 mg/dL (ref 0–149)
VLDL Cholesterol Cal: 15 mg/dL (ref 5–40)

## 2019-10-24 LAB — CBC
Hematocrit: 39.2 % (ref 37.5–51.0)
Hemoglobin: 12.8 g/dL — ABNORMAL LOW (ref 13.0–17.7)
MCH: 30.9 pg (ref 26.6–33.0)
MCHC: 32.7 g/dL (ref 31.5–35.7)
MCV: 95 fL (ref 79–97)
Platelets: 173 10*3/uL (ref 150–450)
RBC: 4.14 x10E6/uL (ref 4.14–5.80)
RDW: 13.1 % (ref 11.6–15.4)
WBC: 3.1 10*3/uL — ABNORMAL LOW (ref 3.4–10.8)

## 2019-10-24 MED ORDER — CARVEDILOL 6.25 MG PO TABS: 9.3750 mg | ORAL_TABLET | Freq: Two times a day (BID) | ORAL | 3 refills | Status: DC

## 2019-10-24 NOTE — Patient Instructions (Addendum)
After Visit Summary:  We will be checking the following labs today - BMET, CBC, HPF and Lipids   Medication Instructions:    Continue with your current medicines.    If you need a refill on your cardiac medications before your next appointment, please call your pharmacy.     Testing/Procedures To Be Arranged:  N/A  Follow-Up:   See Dr. Marlou Porch in 6 months    At [facility], you and your health needs are our priority.  As part of our continuing mission to provide you with exceptional heart care, we have created designated Provider Care Teams.  These Care Teams include your primary Cardiologist (physician) and Advanced Practice Providers (APPs -  Physician Assistants and Nurse Practitioners) who all work together to provide you with the care you need, when you need it.  Special Instructions:  . Stay safe, wash your hands for at least 20 seconds and wear a mask when needed.  . It was good to talk with you today.    Call the [place] office at [phone] if you have any questions, problems or concerns.

## 2019-11-07 ENCOUNTER — Other Ambulatory Visit: Payer: Self-pay | Admitting: *Deleted

## 2019-11-07 DIAGNOSIS — E7849 Other hyperlipidemia: Secondary | ICD-10-CM

## 2019-11-07 DIAGNOSIS — I5022 Chronic systolic (congestive) heart failure: Secondary | ICD-10-CM

## 2019-11-07 MED ORDER — EZETIMIBE 10 MG PO TABS: 10.0000 mg | ORAL_TABLET | Freq: Every day | ORAL | 3 refills | Status: DC

## 2019-12-20 ENCOUNTER — Other Ambulatory Visit: Payer: Self-pay

## 2019-12-20 ENCOUNTER — Other Ambulatory Visit: Payer: Medicare PPO | Admitting: *Deleted

## 2019-12-20 DIAGNOSIS — E7849 Other hyperlipidemia: Secondary | ICD-10-CM

## 2019-12-20 DIAGNOSIS — I5022 Chronic systolic (congestive) heart failure: Secondary | ICD-10-CM

## 2019-12-20 LAB — BASIC METABOLIC PANEL
BUN/Creatinine Ratio: 11 (ref 10–24)
BUN: 16 mg/dL (ref 8–27)
CO2: 24 mmol/L (ref 20–29)
Calcium: 10.3 mg/dL — ABNORMAL HIGH (ref 8.6–10.2)
Chloride: 106 mmol/L (ref 96–106)
Creatinine, Ser: 1.41 mg/dL — ABNORMAL HIGH (ref 0.76–1.27)
GFR calc Af Amer: 56 mL/min/{1.73_m2} — ABNORMAL LOW (ref >59)
GFR calc non Af Amer: 48 mL/min/{1.73_m2} — ABNORMAL LOW (ref >59)
Glucose: 97 mg/dL (ref 65–99)
Potassium: 4.5 mmol/L (ref 3.5–5.2)
Sodium: 143 mmol/L (ref 134–144)

## 2019-12-20 LAB — HEPATIC FUNCTION PANEL
ALT: 11 IU/L (ref 0–44)
AST: 18 IU/L (ref 0–40)
Albumin: 4.4 g/dL (ref 3.7–4.7)
Alkaline Phosphatase: 55 IU/L (ref 44–121)
Bilirubin Total: 0.5 mg/dL (ref 0.0–1.2)
Bilirubin, Direct: 0.16 mg/dL (ref 0.00–0.40)
Total Protein: 7.3 g/dL (ref 6.0–8.5)

## 2019-12-20 LAB — LIPID PANEL
Chol/HDL Ratio: 2.8 ratio (ref 0.0–5.0)
Cholesterol, Total: 169 mg/dL (ref 100–199)
HDL: 61 mg/dL (ref >39)
LDL Chol Calc (NIH): 97 mg/dL (ref 0–99)
Triglycerides: 55 mg/dL (ref 0–149)
VLDL Cholesterol Cal: 11 mg/dL (ref 5–40)

## 2020-03-17 ENCOUNTER — Other Ambulatory Visit: Payer: Self-pay | Admitting: Primary Care

## 2020-03-17 DIAGNOSIS — E785 Hyperlipidemia, unspecified: Secondary | ICD-10-CM

## 2020-03-21 ENCOUNTER — Other Ambulatory Visit (HOSPITAL_COMMUNITY): Payer: Self-pay

## 2020-03-21 DIAGNOSIS — I420 Dilated cardiomyopathy: Secondary | ICD-10-CM

## 2020-03-21 MED ORDER — SPIRONOLACTONE 25 MG PO TABS: 12.5000 mg | ORAL_TABLET | Freq: Every day | ORAL | 3 refills | Status: DC

## 2020-04-02 ENCOUNTER — Other Ambulatory Visit: Payer: Self-pay | Admitting: Primary Care

## 2020-04-02 DIAGNOSIS — I420 Dilated cardiomyopathy: Secondary | ICD-10-CM

## 2020-04-02 NOTE — Telephone Encounter (Signed)
Patient is overdue for a follow-up visit, needs to schedule office visit before we can send further refills. Please let me know if patient has been scheduled.

## 2020-04-03 NOTE — Telephone Encounter (Signed)
Called patient to schedule follow up. Family answered stating they will tell patient to call back and schedule.

## 2020-04-04 ENCOUNTER — Encounter: Payer: Self-pay | Admitting: Primary Care

## 2020-04-04 ENCOUNTER — Other Ambulatory Visit: Payer: Self-pay

## 2020-04-04 ENCOUNTER — Ambulatory Visit (INDEPENDENT_AMBULATORY_CARE_PROVIDER_SITE_OTHER): Payer: Medicare HMO | Admitting: Primary Care

## 2020-04-04 VITALS — BP 94/68 | HR 100 | Temp 98.5°F | Ht 75.0 in | Wt 224.0 lb

## 2020-04-04 DIAGNOSIS — Z8546 Personal history of malignant neoplasm of prostate: Secondary | ICD-10-CM | POA: Diagnosis not present

## 2020-04-04 DIAGNOSIS — I420 Dilated cardiomyopathy: Secondary | ICD-10-CM | POA: Diagnosis not present

## 2020-04-04 DIAGNOSIS — I25119 Atherosclerotic heart disease of native coronary artery with unspecified angina pectoris: Secondary | ICD-10-CM | POA: Diagnosis not present

## 2020-04-04 DIAGNOSIS — I5022 Chronic systolic (congestive) heart failure: Secondary | ICD-10-CM | POA: Diagnosis not present

## 2020-04-04 DIAGNOSIS — R7303 Prediabetes: Secondary | ICD-10-CM | POA: Diagnosis not present

## 2020-04-04 DIAGNOSIS — D72819 Decreased white blood cell count, unspecified: Secondary | ICD-10-CM

## 2020-04-04 DIAGNOSIS — N320 Bladder-neck obstruction: Secondary | ICD-10-CM

## 2020-04-04 LAB — COMPREHENSIVE METABOLIC PANEL
ALT: 14 U/L (ref 0–53)
AST: 23 U/L (ref 0–37)
Albumin: 4.1 g/dL (ref 3.5–5.2)
Alkaline Phosphatase: 43 U/L (ref 39–117)
BUN: 23 mg/dL (ref 6–23)
CO2: 29 mEq/L (ref 19–32)
Calcium: 9.9 mg/dL (ref 8.4–10.5)
Chloride: 105 mEq/L (ref 96–112)
Creatinine, Ser: 1.45 mg/dL (ref 0.40–1.50)
GFR: 46.73 mL/min — ABNORMAL LOW (ref >60.00)
Glucose, Bld: 161 mg/dL — ABNORMAL HIGH (ref 70–99)
Potassium: 4.5 mEq/L (ref 3.5–5.1)
Sodium: 138 mEq/L (ref 135–145)
Total Bilirubin: 0.8 mg/dL (ref 0.2–1.2)
Total Protein: 7 g/dL (ref 6.0–8.3)

## 2020-04-04 LAB — CBC
HCT: 34.8 % — ABNORMAL LOW (ref 39.0–52.0)
Hemoglobin: 11.8 g/dL — ABNORMAL LOW (ref 13.0–17.0)
MCHC: 33.8 g/dL (ref 30.0–36.0)
MCV: 94.5 fl (ref 78.0–100.0)
Platelets: 137 10*3/uL — ABNORMAL LOW (ref 150.0–400.0)
RBC: 3.69 Mil/uL — ABNORMAL LOW (ref 4.22–5.81)
RDW: 13.6 % (ref 11.5–15.5)
WBC: 2.4 10*3/uL — ABNORMAL LOW (ref 4.0–10.5)

## 2020-04-04 LAB — PSA, MEDICARE: PSA: 2.77 ng/ml (ref 0.10–4.00)

## 2020-04-04 LAB — HEMOGLOBIN A1C: Hgb A1c MFr Bld: 6.3 % (ref 4.6–6.5)

## 2020-04-04 NOTE — Assessment & Plan Note (Signed)
No LUTS. Repeat PSA pending today.

## 2020-04-04 NOTE — Assessment & Plan Note (Signed)
Doing well on finasteride 5 mg daily. Continue same. Repeat PSA pending.

## 2020-04-04 NOTE — Assessment & Plan Note (Addendum)
Asymptomatic.  Seems to be taking carvedilol incorrectly, he is taking 3.125 mg once daily. He will discuss further during his upcoming cardiology visit.   Continue Zetia 10 mg, rosuvastatin 40 mg. Lipid panel from October 2021 reviewed.

## 2020-04-04 NOTE — Assessment & Plan Note (Addendum)
Following with cardiology, has upcoming visit in 2 weeks.  BP on the lower side of normal, but stable. Continue losartan 25 mg, spironolactone 12.5 mg daily, carvedilol 3.125 mg daily.

## 2020-04-04 NOTE — Progress Notes (Signed)
Subjective:    Patient ID: Shane Kim, male    DOB: 02-Oct-1945, 75 y.o.   MRN: [id number]  HPI  This visit occurred during the SARS-CoV-2 public health emergency.  Safety protocols were in place, including screening questions prior to the visit, additional usage of staff PPE, and extensive cleaning of exam room while observing appropriate contact time as indicated for disinfecting solutions.   Shane Kim is a 75 year old male with a history of CHF, CAD, prostate cancer, hyperlipidemia, prediabetes, anemia, leukopenia who presents today for follow up and medication refills.  1) CAD/CHF: Currently managed on losartan 25 mg, carvedilol 3.125 mg BID, spironolactone 12.5 mg, rosuvastatin 40 mg, Zetia 10 mg.   He follows with cardiology, has an appointment scheduled for 2 weeks. He denies chest pain, dyspnea, dizziness, headaches. He is actually taking his carvedilol 1/2 tablet once daily in the morning. He checks his blood pressure at home which runs "at about 100".   2) Bladder Outlet Obstruction/Prostate Cancer: Currently managed on finasteride 5 mg. He denies hematuria, difficulty urinating. He is doing well on his regimen.   BP Readings from Last 3 Encounters:  04/04/20 94/68  10/24/19 90/64  04/26/19 110/70     Review of Systems  Eyes: Negative for visual disturbance.  Respiratory: Negative for shortness of breath.   Cardiovascular: Negative for chest pain.  Genitourinary: Negative for dysuria and hematuria.  Neurological: Negative for dizziness and headaches.        Past Medical History:  Diagnosis Date  . Acute systolic CHF (congestive heart failure) ([place])    a. echo 11/21/15: EF <20%, mildly dilated LV, severe diffuse global HK, GR3DD, mod MR, LA mildly dilated, RV sys fxn mildly reduced, PASP 52 mmHg  . CAD (coronary artery disease)    a. cardiac cath 11/22/15: dLM 40%, pLAD 80-90%, mLAD sequential 40%, D2 90%, mLCx 90% at takeoff of OM1, RCA appeared occluded  proximally, LVEF 20%, PCWP 29   . Chickenpox   . Chronic diastolic CHF (congestive heart failure) ([place])   . HLD (hyperlipidemia)   . Pre-diabetes   . Prostate cancer [facility])      Social History   Socioeconomic History  . Marital status: Married    Spouse name: Not on file  . Number of children: Not on file  . Years of education: Not on file  . Highest education level: Not on file  Occupational History  . Not on file  Tobacco Use  . Smoking status: Never Smoker  . Smokeless tobacco: Never Used  Vaping Use  . Vaping Use: Never used  Substance and Sexual Activity  . Alcohol use: No  . Drug use: No  . Sexual activity: Not on file  Other Topics Concern  . Not on file  Social History Narrative   Married. Lives with his wife.   2 children, 2 grandchildren.   Retired. Worked as a Furniture conservator/restorer.   Enjoys fishing.    Social Determinants of Health   Financial Resource Strain: Not on file  Food Insecurity: Not on file  Transportation Needs: Not on file  Physical Activity: Not on file  Stress: Not on file  Social Connections: Not on file  Intimate Partner Violence: Not on file    Past Surgical History:  Procedure Laterality Date  . CARDIAC CATHETERIZATION N/A 11/22/2015   Procedure: Right/Left Heart Cath and Coronary Angiography;  Surgeon: Minna Merritts, MD;  Location: [place] CV LAB;  Service: Cardiovascular;  Laterality: N/A;  .  CARDIAC CATHETERIZATION Left 11/29/2015   Procedure: LEFT FEMORAL ARTERIAL LINE INSERTION;  Surgeon: Gaye Pollack, MD;  Location: [place];  Service: Open Heart Surgery;  Laterality: Left;  . COLONOSCOPY WITH PROPOFOL N/A 02/14/2019   Procedure: COLONOSCOPY WITH PROPOFOL;  Surgeon: Toledo, Benay Pike, MD;  Location: [facility] ENDOSCOPY;  Service: Gastroenterology;  Laterality: N/A;  . CORONARY ARTERY BYPASS GRAFT N/A 11/29/2015   Procedure: CORONARY ARTERY BYPASS GRAFTING (CABG) x four, using left internal mammary artery and right leg saphenous vein  harvested endoscopically;  Surgeon: Gaye Pollack, MD;  Location: [place] OR;  Service: Open Heart Surgery;  Laterality: N/A;  . TEE WITHOUT CARDIOVERSION N/A 11/29/2015   Procedure: TRANSESOPHAGEAL ECHOCARDIOGRAM (TEE);  Surgeon: Gaye Pollack, MD;  Location: [place];  Service: Open Heart Surgery;  Laterality: N/A;  . TURP VAPORIZATION      Family History  Problem Relation Age of Onset  . Hypertension Mother   . Healthy Father     No Known Allergies  Current Outpatient Medications on File Prior to Visit  Medication Sig Dispense Refill  . aspirin (ASPIRIN LOW DOSE) 81 MG EC tablet TAKE 1 TABLET(81 MG) BY MOUTH DAILY. SWALLOW WHOLE 90 tablet 3  . carvedilol (COREG) 6.25 MG tablet Take 1.5 tablets (9.375 mg total) by mouth 2 (two) times daily with a meal. (Patient taking differently: Take 9.375 mg by mouth 2 (two) times daily with a meal. 1/2 tab daily) 270 tablet 3  . finasteride (PROSCAR) 5 MG tablet TAKE 1 TABLET(5 MG) BY MOUTH DAILY 90 tablet 3  . losartan (COZAAR) 25 MG tablet Take 1 tablet (25 mg total) by mouth daily. For blood pressure 30 tablet 0  . rosuvastatin (CRESTOR) 40 MG tablet TAKE 1 TABLET(40 MG) BY MOUTH DAILY FOR CHOLESTEROL 30 tablet 0  . spironolactone (ALDACTONE) 25 MG tablet Take 0.5 tablets (12.5 mg total) by mouth daily. 45 tablet 3  . ezetimibe (ZETIA) 10 MG tablet Take 1 tablet (10 mg total) by mouth daily. 90 tablet 3   No current facility-administered medications on file prior to visit.    BP 94/68   Pulse 100   Temp 98.5 F (36.9 C) (Temporal)   Ht 6\' 3"  (1.905 m)   Wt 224 lb (101.6 kg)   SpO2 98%   BMI 28.00 kg/m    Objective:   Physical Exam Constitutional:      Appearance: He is well-nourished.  Cardiovascular:     Rate and Rhythm: Normal rate and regular rhythm.  Pulmonary:     Effort: Pulmonary effort is normal.     Breath sounds: Normal breath sounds.  Musculoskeletal:     Cervical back: Neck supple.  Skin:    General: Skin is warm and  dry.  Neurological:     Mental Status: He is alert and oriented to person, place, and time.  Psychiatric:        Mood and Affect: Mood and affect and mood normal.            Assessment & Plan:

## 2020-04-04 NOTE — Patient Instructions (Signed)
Stop by the lab prior to leaving today. I will notify you of your results once received.   Please speak with your cardiologist regarding your carvedilol and your overall blood pressure.   It was a pleasure to see you today!

## 2020-04-04 NOTE — Assessment & Plan Note (Signed)
Chronic, repeat CBC pending today. Appears well.

## 2020-04-04 NOTE — Assessment & Plan Note (Signed)
Discussed the importance of a healthy diet and regular exercise in order for weight loss, and to reduce the risk of any potential medical problems.  Repeat A1C pending today.

## 2020-04-04 NOTE — Assessment & Plan Note (Signed)
Appears euvolemic today. Lungs clear.   Question his carvedilol he is taking 3.125 mg once daily. Discussed prescribed dosing but to continue as is for now. His BP is on the lower side of normal. He will discuss with his cardiologist in 2 weeks.

## 2020-04-05 ENCOUNTER — Other Ambulatory Visit: Payer: Self-pay | Admitting: Primary Care

## 2020-04-05 DIAGNOSIS — D72819 Decreased white blood cell count, unspecified: Secondary | ICD-10-CM

## 2020-04-06 ENCOUNTER — Telehealth: Payer: Self-pay | Admitting: Oncology

## 2020-04-06 NOTE — Telephone Encounter (Signed)
Called pt to set up appt for next week per MD. Scheduled for 2/15 @ 9:45am. Pt confirmed.

## 2020-04-10 ENCOUNTER — Inpatient Hospital Stay: Payer: Medicare HMO | Attending: Oncology | Admitting: Oncology

## 2020-04-10 ENCOUNTER — Encounter: Payer: Self-pay | Admitting: Oncology

## 2020-04-10 ENCOUNTER — Inpatient Hospital Stay: Payer: Medicare HMO

## 2020-04-10 VITALS — BP 113/77 | HR 61 | Temp 97.7°F | Resp 16 | Ht 75.0 in | Wt 227.3 lb

## 2020-04-10 DIAGNOSIS — Z79899 Other long term (current) drug therapy: Secondary | ICD-10-CM | POA: Diagnosis not present

## 2020-04-10 DIAGNOSIS — D649 Anemia, unspecified: Secondary | ICD-10-CM | POA: Insufficient documentation

## 2020-04-10 DIAGNOSIS — D708 Other neutropenia: Secondary | ICD-10-CM | POA: Diagnosis not present

## 2020-04-10 DIAGNOSIS — R5383 Other fatigue: Secondary | ICD-10-CM | POA: Diagnosis not present

## 2020-04-10 LAB — COMPREHENSIVE METABOLIC PANEL
ALT: 19 U/L (ref 0–44)
AST: 29 U/L (ref 15–41)
Albumin: 4.2 g/dL (ref 3.5–5.0)
Alkaline Phosphatase: 43 U/L (ref 38–126)
Anion gap: 10 (ref 5–15)
BUN: 19 mg/dL (ref 8–23)
CO2: 25 mmol/L (ref 22–32)
Calcium: 9.8 mg/dL (ref 8.9–10.3)
Chloride: 104 mmol/L (ref 98–111)
Creatinine, Ser: 1.13 mg/dL (ref 0.61–1.24)
GFR, Estimated: >60 mL/min (ref >60)
Glucose, Bld: 87 mg/dL (ref 70–99)
Potassium: 4.6 mmol/L (ref 3.5–5.1)
Sodium: 139 mmol/L (ref 135–145)
Total Bilirubin: 0.8 mg/dL (ref 0.3–1.2)
Total Protein: 8.1 g/dL (ref 6.5–8.1)

## 2020-04-10 LAB — CBC WITH DIFFERENTIAL/PLATELET
Abs Immature Granulocytes: 0.01 10*3/uL (ref 0.00–0.07)
Basophils Absolute: 0 10*3/uL (ref 0.0–0.1)
Basophils Relative: 0 %
Eosinophils Absolute: 0.1 10*3/uL (ref 0.0–0.5)
Eosinophils Relative: 4 %
HCT: 37.3 % — ABNORMAL LOW (ref 39.0–52.0)
Hemoglobin: 11.9 g/dL — ABNORMAL LOW (ref 13.0–17.0)
Immature Granulocytes: 0 %
Lymphocytes Relative: 35 %
Lymphs Abs: 0.8 10*3/uL (ref 0.7–4.0)
MCH: 31.1 pg (ref 26.0–34.0)
MCHC: 31.9 g/dL (ref 30.0–36.0)
MCV: 97.4 fL (ref 80.0–100.0)
Monocytes Absolute: 0.1 10*3/uL (ref 0.1–1.0)
Monocytes Relative: 5 %
Neutro Abs: 1.2 10*3/uL — ABNORMAL LOW (ref 1.7–7.7)
Neutrophils Relative %: 56 %
Platelets: 144 10*3/uL — ABNORMAL LOW (ref 150–400)
RBC: 3.83 MIL/uL — ABNORMAL LOW (ref 4.22–5.81)
RDW: 12.9 % (ref 11.5–15.5)
WBC: 2.2 10*3/uL — ABNORMAL LOW (ref 4.0–10.5)
nRBC: 0 % (ref 0.0–0.2)

## 2020-04-10 LAB — VITAMIN B12: Vitamin B-12: 423 pg/mL (ref 180–914)

## 2020-04-10 LAB — PATHOLOGIST SMEAR REVIEW

## 2020-04-10 LAB — RETICULOCYTES
Immature Retic Fract: 10.3 % (ref 2.3–15.9)
RBC.: 3.76 MIL/uL — ABNORMAL LOW (ref 4.22–5.81)
Retic Count, Absolute: 43.6 10*3/uL (ref 19.0–186.0)
Retic Ct Pct: 1.2 % (ref 0.4–3.1)

## 2020-04-10 LAB — TSH: TSH: 1.215 u[IU]/mL (ref 0.350–4.500)

## 2020-04-10 LAB — IRON AND TIBC
Iron: 74 ug/dL (ref 45–182)
Saturation Ratios: 19 % (ref 17.9–39.5)
TIBC: 395 ug/dL (ref 250–450)
UIBC: 321 ug/dL

## 2020-04-10 LAB — FERRITIN: Ferritin: 54 ng/mL (ref 24–336)

## 2020-04-10 LAB — FOLATE: Folate: 21.3 ng/mL (ref >5.9)

## 2020-04-10 NOTE — Progress Notes (Signed)
Hematology/Oncology Consult note [facility]  [phone] [phone]  Patient Care Team: Pleas Koch, NP as PCP - General (Internal Medicine) Jerline Pain, MD as PCP - Cardiology (Cardiology)   Name of the patient: Shane Kim  [id number]  [birth year]   Date of visit: 04/10/20  Diagnosis-patient seen for anemia in the past now referred for leukopenia  Chief complaint/ Reason for visit-reestablish follow-up for leukopenia  Heme/Onc history: Patient is a 75 year old African-American male with a prior history of ischemic cardiomyopathy, congestive heart failure, hyperlipidemia who had seen me in the past for anemia and neutropenia.  After his initial visit with me in October 2020 he never followed up and now comes here to reestablish care. [name] is differential has shown neutropenia with an [place] that fluctuates between 1-2 during his episodes of leukopenia.  More recently patient found to have a white count of 2.4 with an H&H of 11.8/34.8 and a platelet count of 137.  Patient has had mild intermittent thrombocytopenia in the past as well which had subsequently resolved. Patient has had chronic waxing and waning leukopenia with a white count that fluctuates between 3-9 over the last 5 years.  Interval history-patient currently reports mild fatigue.  He remains independent of his ADLs.  Denies any changes in his appetite or unintentional weight loss.  ECOG PS- 1 Pain scale- 0   Review of systems- Review of Systems  Constitutional: Positive for malaise/fatigue. Negative for chills, fever and weight loss.  HENT: Negative for congestion, ear discharge and nosebleeds.   Eyes: Negative for blurred vision.  Respiratory: Negative for cough, hemoptysis, sputum production, shortness of breath and wheezing.   Cardiovascular: Negative for chest pain, palpitations, orthopnea and claudication.  Gastrointestinal: Negative for  abdominal pain, blood in stool, constipation, diarrhea, heartburn, melena, nausea and vomiting.  Genitourinary: Negative for dysuria, flank pain, frequency, hematuria and urgency.  Musculoskeletal: Negative for back pain, joint pain and myalgias.  Skin: Negative for rash.  Neurological: Negative for dizziness, tingling, focal weakness, seizures, weakness and headaches.  Endo/Heme/Allergies: Does not bruise/bleed easily.  Psychiatric/Behavioral: Negative for depression and suicidal ideas. The patient does not have insomnia.        No Known Allergies   Past Medical History:  Diagnosis Date  . Acute systolic CHF (congestive heart failure) ([place])    a. echo 11/21/15: EF <20%, mildly dilated LV, severe diffuse global HK, GR3DD, mod MR, LA mildly dilated, RV sys fxn mildly reduced, PASP 52 mmHg  . CAD (coronary artery disease)    a. cardiac cath 11/22/15: dLM 40%, pLAD 80-90%, mLAD sequential 40%, D2 90%, mLCx 90% at takeoff of OM1, RCA appeared occluded proximally, LVEF 20%, PCWP 29   . Chickenpox   . Chronic diastolic CHF (congestive heart failure) ([place])   . HLD (hyperlipidemia)   . Pre-diabetes   . Prostate cancer [facility]      Past Surgical History:  Procedure Laterality Date  . CARDIAC CATHETERIZATION N/A 11/22/2015   Procedure: Right/Left Heart Cath and Coronary Angiography;  Surgeon: Minna Merritts, MD;  Location: [place] CV LAB;  Service: Cardiovascular;  Laterality: N/A;  . CARDIAC CATHETERIZATION Left 11/29/2015   Procedure: LEFT FEMORAL ARTERIAL LINE INSERTION;  Surgeon: Gaye Pollack, MD;  Location: [place];  Service: Open Heart Surgery;  Laterality: Left;  . COLONOSCOPY WITH PROPOFOL N/A 02/14/2019   Procedure: COLONOSCOPY WITH PROPOFOL;  Surgeon: Toledo, Benay Pike, MD;  Location: [facility] ENDOSCOPY;  Service:  Gastroenterology;  Laterality: N/A;  . CORONARY ARTERY BYPASS GRAFT N/A 11/29/2015   Procedure: CORONARY ARTERY BYPASS GRAFTING (CABG) x four, using left internal mammary  artery and right leg saphenous vein harvested endoscopically;  Surgeon: Gaye Pollack, MD;  Location: [place] OR;  Service: Open Heart Surgery;  Laterality: N/A;  . TEE WITHOUT CARDIOVERSION N/A 11/29/2015   Procedure: TRANSESOPHAGEAL ECHOCARDIOGRAM (TEE);  Surgeon: Gaye Pollack, MD;  Location: [place];  Service: Open Heart Surgery;  Laterality: N/A;  . TURP VAPORIZATION      Social History   Socioeconomic History  . Marital status: Married    Spouse name: Not on file  . Number of children: Not on file  . Years of education: Not on file  . Highest education level: Not on file  Occupational History  . Not on file  Tobacco Use  . Smoking status: Never Smoker  . Smokeless tobacco: Never Used  Vaping Use  . Vaping Use: Never used  Substance and Sexual Activity  . Alcohol use: No  . Drug use: No  . Sexual activity: Not on file  Other Topics Concern  . Not on file  Social History Narrative   Married. Lives with his wife.   2 children, 2 grandchildren.   Retired. Worked as a Furniture conservator/restorer.   Enjoys fishing.    Social Determinants of Health   Financial Resource Strain: Not on file  Food Insecurity: Not on file  Transportation Needs: Not on file  Physical Activity: Not on file  Stress: Not on file  Social Connections: Not on file  Intimate Partner Violence: Not on file    Family History  Problem Relation Age of Onset  . Hypertension Mother   . Healthy Father      Current Outpatient Medications:  .  aspirin (ASPIRIN LOW DOSE) 81 MG EC tablet, TAKE 1 TABLET(81 MG) BY MOUTH DAILY. SWALLOW WHOLE, Disp: 90 tablet, Rfl: 3 .  carvedilol (COREG) 6.25 MG tablet, Take 1.5 tablets (9.375 mg total) by mouth 2 (two) times daily with a meal. (Patient taking differently: Take 9.375 mg by mouth 2 (two) times daily with a meal. 1/2 tab daily), Disp: 270 tablet, Rfl: 3 .  ezetimibe (ZETIA) 10 MG tablet, Take 1 tablet (10 mg total) by mouth daily., Disp: 90 tablet, Rfl: 3 .  finasteride (PROSCAR) 5  MG tablet, TAKE 1 TABLET(5 MG) BY MOUTH DAILY, Disp: 90 tablet, Rfl: 3 .  losartan (COZAAR) 25 MG tablet, Take 1 tablet (25 mg total) by mouth daily. For blood pressure, Disp: 30 tablet, Rfl: 0 .  rosuvastatin (CRESTOR) 40 MG tablet, TAKE 1 TABLET(40 MG) BY MOUTH DAILY FOR CHOLESTEROL, Disp: 30 tablet, Rfl: 0 .  spironolactone (ALDACTONE) 25 MG tablet, Take 0.5 tablets (12.5 mg total) by mouth daily., Disp: 45 tablet, Rfl: 3  Physical exam:  Vitals:   04/10/20 1004  BP: 113/77  Pulse: 61  Resp: 16  Temp: 97.7 F (36.5 C)  TempSrc: Oral  Weight: 227 lb 4.8 oz (103.1 kg)  Height: '6\' 3"'  (1.905 m)   Physical Exam Eyes:     Extraocular Movements: EOM normal.  Cardiovascular:     Rate and Rhythm: Normal rate and regular rhythm.     Heart sounds: Normal heart sounds.  Pulmonary:     Effort: Pulmonary effort is normal.     Breath sounds: Normal breath sounds.  Abdominal:     General: Bowel sounds are normal.     Palpations: Abdomen is soft.  Lymphadenopathy:     Comments: No palpable cervical, supraclavicular, axillary or inguinal adenopathy   Skin:    General: Skin is warm and dry.  Neurological:     Mental Status: He is alert and oriented to person, place, and time.      CMP Latest Ref Rng & Units 04/04/2020  Glucose 70 - 99 mg/dL 161(H)  BUN 6 - 23 mg/dL 23  Creatinine 0.40 - 1.50 mg/dL 1.45  Sodium 135 - 145 mEq/L 138  Potassium 3.5 - 5.1 mEq/L 4.5  Chloride 96 - 112 mEq/L 105  CO2 19 - 32 mEq/L 29  Calcium 8.4 - 10.5 mg/dL 9.9  Total Protein 6.0 - 8.3 g/dL 7.0  Total Bilirubin 0.2 - 1.2 mg/dL 0.8  Alkaline Phos 39 - 117 U/L 43  AST 0 - 37 U/L 23  ALT 0 - 53 U/L 14   CBC Latest Ref Rng & Units 04/04/2020  WBC 4.0 - 10.5 K/uL 2.4 Repeated and verified X2.(L)  Hemoglobin 13.0 - 17.0 g/dL 11.8(L)  Hematocrit 39.0 - 52.0 % 34.8(L)  Platelets 150.0 - 400.0 K/uL 137.0(L)    Assessment and plan- Patient is a 75 y.o. male seen for leukopenia and anemia in the past and  lost to follow-up now referred back for the same reason  Leukopenia/neutropenia: This has been chronic and waxing and waning dating back to at least 2017.Differential is mainly shown mild to moderate neutropenia.  Most recent CBC showed white cell count of 2.4 but he has not had a differential checked.  Patient also has baseline anemia with a hemoglobin that has remained around 10-11 since 2017.  Platelet counts have remained normal for the most part although there are a couple of occasions when his platelet counts have dropped to the 140s.  HIV and hepatitis C testing done in 2020 were all negative  Today I will check a CBC with differential, pathology smear review, ANA comprehensive panel, flow cytometry in addition to a comprehensive anemia work-up including ferritin and iron studies B12 and folate, reticulocyte count,TSH, myeloma panel.  He does have baseline CKD with a creatinine of 1.4 which may be partly contributing to his anemia as well.  At this time inclined to monitor his counts conservatively without the need for bone marrow biopsy especially since his neutropenia dates back to 5 years.  In person or video visit with me in 2 weeks.   Visit Diagnosis 1. Other neutropenia ([place])   2. Normocytic anemia      Dr. Randa Evens, MD, MPH [facility] [phone] 04/10/2020 10:07 AM

## 2020-04-10 NOTE — Progress Notes (Signed)
Pt here for anemia. When I go over the hx- he says the things I asked him must have been in long past. He does not remember TURP or the stent.no issues.

## 2020-04-11 LAB — ANA COMPREHENSIVE PANEL

## 2020-04-11 LAB — HAPTOGLOBIN: Haptoglobin: 141 mg/dL (ref 34–355)

## 2020-04-12 LAB — COMP PANEL: LEUKEMIA/LYMPHOMA

## 2020-04-13 LAB — MULTIPLE MYELOMA PANEL, SERUM
Albumin SerPl Elph-Mcnc: 3.9 g/dL (ref 2.9–4.4)
Albumin/Glob SerPl: 1.2 (ref 0.7–1.7)
Alpha 1: 0.3 g/dL (ref 0.0–0.4)
Alpha2 Glob SerPl Elph-Mcnc: 0.9 g/dL (ref 0.4–1.0)
B-Globulin SerPl Elph-Mcnc: 1 g/dL (ref 0.7–1.3)
Gamma Glob SerPl Elph-Mcnc: 1.3 g/dL (ref 0.4–1.8)
Globulin, Total: 3.5 g/dL (ref 2.2–3.9)
IgA: 190 mg/dL (ref 61–437)
IgG (Immunoglobin G), Serum: 1308 mg/dL (ref 603–1613)
IgM (Immunoglobulin M), Srm: 63 mg/dL (ref 15–143)
M Protein SerPl Elph-Mcnc: 0.5 g/dL — ABNORMAL HIGH
Total Protein ELP: 7.4 g/dL (ref 6.0–8.5)

## 2020-04-16 ENCOUNTER — Other Ambulatory Visit: Payer: Self-pay | Admitting: Primary Care

## 2020-04-16 DIAGNOSIS — C61 Malignant neoplasm of prostate: Secondary | ICD-10-CM

## 2020-04-16 DIAGNOSIS — E785 Hyperlipidemia, unspecified: Secondary | ICD-10-CM

## 2020-04-17 ENCOUNTER — Telehealth: Payer: Self-pay | Admitting: Primary Care

## 2020-04-17 NOTE — Telephone Encounter (Signed)
LVM for pt to rtn my call to schedule AWV with [facility].  

## 2020-04-18 ENCOUNTER — Ambulatory Visit: Payer: Medicare HMO | Admitting: Cardiology

## 2020-04-18 ENCOUNTER — Other Ambulatory Visit: Payer: Self-pay

## 2020-04-18 ENCOUNTER — Encounter: Payer: Self-pay | Admitting: Cardiology

## 2020-04-18 VITALS — BP 90/60 | HR 67 | Ht 75.0 in | Wt 223.0 lb

## 2020-04-18 DIAGNOSIS — I2510 Atherosclerotic heart disease of native coronary artery without angina pectoris: Secondary | ICD-10-CM

## 2020-04-18 DIAGNOSIS — I5022 Chronic systolic (congestive) heart failure: Secondary | ICD-10-CM | POA: Diagnosis not present

## 2020-04-18 DIAGNOSIS — Z951 Presence of aortocoronary bypass graft: Secondary | ICD-10-CM | POA: Diagnosis not present

## 2020-04-18 DIAGNOSIS — E7800 Pure hypercholesterolemia, unspecified: Secondary | ICD-10-CM | POA: Diagnosis not present

## 2020-04-18 LAB — LIPID PANEL
Chol/HDL Ratio: 2.8 ratio (ref 0.0–5.0)
Cholesterol, Total: 161 mg/dL (ref 100–199)
HDL: 58 mg/dL (ref >39)
LDL Chol Calc (NIH): 93 mg/dL (ref 0–99)
Triglycerides: 47 mg/dL (ref 0–149)
VLDL Cholesterol Cal: 10 mg/dL (ref 5–40)

## 2020-04-18 MED ORDER — CARVEDILOL 3.125 MG PO TABS: 3.1250 mg | ORAL_TABLET | Freq: Two times a day (BID) | ORAL | 1 refills | Status: DC

## 2020-04-18 NOTE — Patient Instructions (Addendum)
Medication Instructions:  Your physician has recommended you make the following change in your medication:  1: decreased Coreg to 3.125mg  twice a day  *If you need a refill on your cardiac medications before your next appointment, please call your pharmacy   Lab Work: Today: Lipids If you have labs (blood work) drawn today and your tests are completely normal, you will receive your results only by: Marland Kitchen MyChart Message (if you have MyChart) OR . A paper copy in the mail If you have any lab test that is abnormal or we need to change your treatment, we will call you to review the results.   Testing/Procedures: none   Follow-Up: At [facility], you and your health needs are our priority.  As part of our continuing mission to provide you with exceptional heart care, we have created designated Provider Care Teams.  These Care Teams include your primary Cardiologist (physician) and Advanced Practice Providers (APPs -  Physician Assistants and Nurse Practitioners) who all work together to provide you with the care you need, when you need it.  We recommend signing up for the patient portal called "MyChart".  Sign up information is provided on this After Visit Summary.  MyChart is used to connect with patients for Virtual Visits (Telemedicine).  Patients are able to view lab/test results, encounter notes, upcoming appointments, etc.  Non-urgent messages can be sent to your provider as well.   To learn more about what you can do with MyChart, go to [email or web address].    Your next appointment:   6 month(s)  The format for your next appointment:   In Person  Provider:   You may see Candee Furbish, MD or one of the following Advanced Practice Providers on your designated Care Team:    Kathyrn Drown, NP

## 2020-04-18 NOTE — Progress Notes (Signed)
Cardiology Office Note:    Date:  04/18/2020   ID:  Dainel Arcidiacono, DOB [birth year], MRN [id number]  PCP:  Pleas Koch, NP   [place]  Cardiologist:  Candee Furbish, MD  Advanced Practice Provider:  No care team member to display Electrophysiologist:  None       Referring MD: Pleas Koch, NP     History of Present Illness:    Shane Kim is a 75 y.o. male here for follow-up visit with ischemic cardiomyopathy coronary artery disease prior CABG 2017 EF 20%.  Last EF 50%.  Overall been doing quite well.  Has been working as a crossing guard.  No chest pain no shortness of breath.  BP has been soft.  No fainting however.  No orthopnea no PND.  Past Medical History:  Diagnosis Date  . Acute systolic CHF (congestive heart failure) ([place])    a. echo 11/21/15: EF <20%, mildly dilated LV, severe diffuse global HK, GR3DD, mod MR, LA mildly dilated, RV sys fxn mildly reduced, PASP 52 mmHg  . Anemia   . CAD (coronary artery disease)    a. cardiac cath 11/22/15: dLM 40%, pLAD 80-90%, mLAD sequential 40%, D2 90%, mLCx 90% at takeoff of OM1, RCA appeared occluded proximally, LVEF 20%, PCWP 29   . Chickenpox   . Chronic diastolic CHF (congestive heart failure) ([place])   . HLD (hyperlipidemia)   . Hypertension   . Pre-diabetes   . Prostate cancer [facility])     Past Surgical History:  Procedure Laterality Date  . CARDIAC CATHETERIZATION N/A 11/22/2015   Procedure: Right/Left Heart Cath and Coronary Angiography;  Surgeon: Minna Merritts, MD;  Location: [place] [facility];  Service: Cardiovascular;  Laterality: N/A;  . CARDIAC CATHETERIZATION Left 11/29/2015   Procedure: LEFT FEMORAL ARTERIAL LINE INSERTION;  Surgeon: Gaye Pollack, MD;  Location: [place];  Service: Open Heart Surgery;  Laterality: Left;  . COLONOSCOPY WITH PROPOFOL N/A 02/14/2019   Procedure: COLONOSCOPY WITH PROPOFOL;  Surgeon: Toledo, Benay Pike, MD;  Location: [facility] ENDOSCOPY;  Service:  Gastroenterology;  Laterality: N/A;  . CORONARY ARTERY BYPASS GRAFT N/A 11/29/2015   Procedure: CORONARY ARTERY BYPASS GRAFTING (CABG) x four, using left internal mammary artery and right leg saphenous vein harvested endoscopically;  Surgeon: Gaye Pollack, MD;  Location: [place] OR;  Service: Open Heart Surgery;  Laterality: N/A;  . TEE WITHOUT CARDIOVERSION N/A 11/29/2015   Procedure: TRANSESOPHAGEAL ECHOCARDIOGRAM (TEE);  Surgeon: Gaye Pollack, MD;  Location: [place];  Service: Open Heart Surgery;  Laterality: N/A;  . TURP VAPORIZATION      Current Medications: Current Meds  Medication Sig  . aspirin (ASPIRIN LOW DOSE) 81 MG EC tablet TAKE 1 TABLET(81 MG) BY MOUTH DAILY. SWALLOW WHOLE  . carvedilol (COREG) 3.125 MG tablet Take 1 tablet (3.125 mg total) by mouth 2 (two) times daily with a meal.  . ezetimibe (ZETIA) 10 MG tablet Take 1 tablet (10 mg total) by mouth daily.  . finasteride (PROSCAR) 5 MG tablet TAKE 1 TABLET(5 MG) BY MOUTH DAILY  . losartan (COZAAR) 25 MG tablet Take 1 tablet (25 mg total) by mouth daily. For blood pressure  . rosuvastatin (CRESTOR) 40 MG tablet TAKE 1 TABLET(40 MG) BY MOUTH DAILY FOR CHOLESTEROL  . spironolactone (ALDACTONE) 25 MG tablet Take 0.5 tablets (12.5 mg total) by mouth daily.  . [DISCONTINUED] carvedilol (COREG) 6.25 MG tablet Take 1.5 tablets (9.375 mg total) by mouth 2 (two) times daily with  a meal. (Patient taking differently: Take 9.375 mg by mouth 2 (two) times daily with a meal. 1/2 tab daily)     Allergies:   Patient has no known allergies.   Social History   Socioeconomic History  . Marital status: Married    Spouse name: Not on file  . Number of children: Not on file  . Years of education: Not on file  . Highest education level: Not on file  Occupational History  . Not on file  Tobacco Use  . Smoking status: Never Smoker  . Smokeless tobacco: Never Used  Vaping Use  . Vaping Use: Never used  Substance and Sexual Activity  . Alcohol  use: No  . Drug use: No  . Sexual activity: Not Currently  Other Topics Concern  . Not on file  Social History Narrative   Married. Lives with his wife.   2 children, 2 grandchildren.   Retired. Worked as a Furniture conservator/restorer.   Enjoys fishing.    Social Determinants of Health   Financial Resource Strain: Not on file  Food Insecurity: Not on file  Transportation Needs: Not on file  Physical Activity: Not on file  Stress: Not on file  Social Connections: Not on file     Family History: The patient's family history includes Healthy in his father; Hypertension in his mother.  ROS:   Please see the history of present illness.     All other systems reviewed and are negative.  EKGs/Labs/Other Studies Reviewed:    The following studies were reviewed today:   ECHO IMPRESSIONS March 2020  1. The left ventricle has mildly reduced systolic function, with an  ejection fraction of 45-50%. The cavity size was mildly dilated. There is  mildly increased left ventricular wall thickness. Left ventricular  diastolic parameters were normal.  2. Mid and basal inferior wall akinesis distal septal hypokinesis.  3. The right ventricle has normal systolic function. The cavity was  normal. There is no increase in right ventricular wall thickness.  4. Left atrial size was mildly dilated.  5. The mitral valve is degenerative. Mild thickening of the mitral valve  leaflet. Mild calcification of the mitral valve leaflet. There is moderate  mitral annular calcification present.  6. The tricuspid valve is normal in structure.  7. The aortic valve is tricuspid Severely thickening of the aortic valve  Sclerosis without any evidence of stenosis of the aortic valve. Aortic  valve regurgitation is trivial by color flow Doppler.  8. The pulmonic valve was grossly normal. Pulmonic valve regurgitation is  mild by color flow Doppler.  9. The aortic root is normal in size and structure.     EKG:  EKG  is  ordered today.  The ekg ordered today demonstrates sinus rhythm 67 with no other abnormalities.  Recent Labs: 04/10/2020: ALT 19; BUN 19; Creatinine, Ser 1.13; Hemoglobin 11.9; Platelets 144; Potassium 4.6; Sodium 139; TSH 1.215  Recent Lipid Panel    Component Value Date/Time   CHOL 169 12/20/2019 0954   TRIG 55 12/20/2019 0954   HDL 61 12/20/2019 0954   CHOLHDL 2.8 12/20/2019 0954   CHOLHDL 3 03/10/2019 1114   VLDL 20.4 03/10/2019 1114   LDLCALC 97 12/20/2019 0954     Risk Assessment/Calculations:      Physical Exam:    VS:  BP 90/60 (BP Location: Left Arm, Patient Position: Sitting, Cuff Size: Normal)   Pulse 67   Ht 6\' 3"  (1.905 m)   Wt 223 lb (101.2  kg)   SpO2 97%   BMI 27.87 kg/m     Wt Readings from Last 3 Encounters:  04/18/20 223 lb (101.2 kg)  04/10/20 227 lb 4.8 oz (103.1 kg)  04/04/20 224 lb (101.6 kg)     GEN:  Well nourished, well developed in no acute distress HEENT: Normal NECK: No JVD; No carotid bruits LYMPHATICS: No lymphadenopathy CARDIAC: RRR, no murmurs, rubs, gallops, CABG scar RESPIRATORY:  Clear to auscultation without rales, wheezing or rhonchi  ABDOMEN: Soft, non-tender, non-distended MUSCULOSKELETAL:  No edema; No deformity  SKIN: Warm and dry NEUROLOGIC:  Alert and oriented x 3 PSYCHIATRIC:  Normal affect   ASSESSMENT:    1. Chronic systolic congestive heart failure ([place])   2. CAD in native artery   3. S/P CABG x 4   4. Pure hypercholesterolemia    PLAN:    In order of problems listed above:  Chronic systolic heart failure secondary to ischemic cardiomyopathy post CABG -EF originally 20, now 45-50.  NYHA class I.  Doing quite well.  Walking.  No chest pain.  Continue with carvedilol, however we have clarified dose of 3.125 mg twice a day.  Losartan Aldactone.  Even though blood pressures are fairly soft, no syncope no dizziness.  CAD with remote CABG 2017 -Overall doing well without any anginal symptoms.  On low-dose  aspirin.  Hyperlipidemia -Continue with statin therapy.  Crestor 40, new addition of Zetia 10.  LDL 97 at last check.  Goal is less than 70.  We will recheck lipid panel today.  Essential hypertension -Doing very well.    Low-dose carvedilol etc.      Medication Adjustments/Labs and Tests Ordered: Current medicines are reviewed at length with the patient today.  Concerns regarding medicines are outlined above.  Orders Placed This Encounter  Procedures  . Lipid panel  . EKG 12-Lead   Meds ordered this encounter  Medications  . carvedilol (COREG) 3.125 MG tablet    Sig: Take 1 tablet (3.125 mg total) by mouth 2 (two) times daily with a meal.    Dispense:  180 tablet    Refill:  1    Decreased dose    Patient Instructions  Medication Instructions:  Your physician has recommended you make the following change in your medication:  1: decreased Coreg to 3.125mg  twice a day  *If you need a refill on your cardiac medications before your next appointment, please call your pharmacy   Lab Work: Today: Lipids If you have labs (blood work) drawn today and your tests are completely normal, you will receive your results only by: Marland Kitchen MyChart Message (if you have MyChart) OR . A paper copy in the mail If you have any lab test that is abnormal or we need to change your treatment, we will call you to review the results.   Testing/Procedures: none   Follow-Up: At [facility], you and your health needs are our priority.  As part of our continuing mission to provide you with exceptional heart care, we have created designated Provider Care Teams.  These Care Teams include your primary Cardiologist (physician) and Advanced Practice Providers (APPs -  Physician Assistants and Nurse Practitioners) who all work together to provide you with the care you need, when you need it.  We recommend signing up for the patient portal called "MyChart".  Sign up information is provided on this After  Visit Summary.  MyChart is used to connect with patients for Virtual Visits (Telemedicine).  Patients are  able to view lab/test results, encounter notes, upcoming appointments, etc.  Non-urgent messages can be sent to your provider as well.   To learn more about what you can do with MyChart, go to [email or web address].    Your next appointment:   6 month(s)  The format for your next appointment:   In Person  Provider:   You may see Candee Furbish, MD or one of the following Advanced Practice Providers on your designated Care Team:    Kathyrn Drown, NP         Signed, Candee Furbish, MD  04/18/2020 10:38 AM    [place]

## 2020-04-19 ENCOUNTER — Telehealth: Payer: Self-pay | Admitting: Oncology

## 2020-04-19 ENCOUNTER — Inpatient Hospital Stay: Payer: Medicare HMO | Admitting: Oncology

## 2020-04-19 NOTE — Telephone Encounter (Signed)
Left VM requesting a call back to r/s today's (2/24) missed appt.

## 2020-04-27 ENCOUNTER — Other Ambulatory Visit: Payer: Self-pay | Admitting: Primary Care

## 2020-04-27 ENCOUNTER — Encounter: Payer: Self-pay | Admitting: *Deleted

## 2020-04-27 DIAGNOSIS — I420 Dilated cardiomyopathy: Secondary | ICD-10-CM

## 2020-04-27 NOTE — Telephone Encounter (Signed)
Dr. Marlou Porch,  I received a refill request for this patient's losartan 25 mg.  I see that his blood pressure has been on the lower side of normal, especially recently, so I am reaching out to you to get your thoughts regarding continuation of his losartan.  I see that he is also on spironolactone 12.5 mg along with his carvedilol 3.125 mg twice daily.  I presume that the spironolactone and carvedilol are more essential to his overall heart function.  Let me know what you think, thanks!  Allie Bossier, NP-C [place]  BP Readings from Last 3 Encounters:  04/18/20 90/60  04/10/20 113/77  04/04/20 94/68

## 2020-05-01 ENCOUNTER — Telehealth: Payer: Self-pay

## 2020-05-01 DIAGNOSIS — I420 Dilated cardiomyopathy: Secondary | ICD-10-CM

## 2020-05-01 NOTE — Telephone Encounter (Signed)
Mrs Demeo left v/m that she wants to know why we have not responded to [facility] request for proscar; appears from med list pt should have proscar refills; I spoke with Saralyn Pilar at [facility] and he said pt has available refills on proscar but needs refills on losartan; see 04/27/20 med refill note. I tried to call Mrs [place] and no answer and mail box is full. Will send note to [facility] CMA.

## 2020-05-02 MED ORDER — LOSARTAN POTASSIUM 25 MG PO TABS
25.0000 mg | ORAL_TABLET | Freq: Every day | ORAL | 3 refills | Status: DC
Start: 2020-05-02 — End: 2021-04-11

## 2020-05-02 NOTE — Telephone Encounter (Signed)
Refills sent to pharmacy. 

## 2020-05-02 NOTE — Telephone Encounter (Signed)
Please advise think we will need to change meds

## 2020-05-09 ENCOUNTER — Other Ambulatory Visit: Payer: Self-pay

## 2020-05-09 ENCOUNTER — Telehealth: Payer: Self-pay | Admitting: Cardiology

## 2020-05-09 DIAGNOSIS — E7800 Pure hypercholesterolemia, unspecified: Secondary | ICD-10-CM

## 2020-05-09 DIAGNOSIS — Z79899 Other long term (current) drug therapy: Secondary | ICD-10-CM

## 2020-05-09 MED ORDER — EZETIMIBE 10 MG PO TABS: 10.0000 mg | ORAL_TABLET | Freq: Every day | ORAL | 3 refills | Status: DC

## 2020-05-09 NOTE — Telephone Encounter (Signed)
Jerline Pain, MD  04/19/2020 6:18 AM EST      LDL 93 Add Zetia 10mg  PO QD Repeat lipids in 3 months Goal LDL <70 Candee Furbish, MD   Spoke with pt who is aware of his results, to start zetia 10 mg daily (already sent into pharmacy) and repeat lab in 3 months.  Pt handled the phone to a male who was there with him and requested I speak with her.  appt for lab scheduled for 08/09/2020. Reviewed address with her.  Pt will c/b with any further questions or concerns.

## 2020-05-09 NOTE — Telephone Encounter (Signed)
Pt's medication was sent to pt's pharmacy as requested. Confirmation received.  °

## 2020-05-09 NOTE — Telephone Encounter (Signed)
New messge:     Patient calling to get a order for his 3 mo labs.

## 2020-05-22 ENCOUNTER — Ambulatory Visit: Payer: Medicare HMO

## 2020-05-22 ENCOUNTER — Telehealth: Payer: Self-pay | Admitting: Oncology

## 2020-05-22 ENCOUNTER — Telehealth: Payer: Self-pay

## 2020-05-22 ENCOUNTER — Other Ambulatory Visit: Payer: Self-pay

## 2020-05-22 NOTE — Progress Notes (Deleted)
Subjective:   Shane Kim is a 75 y.o. male who presents for Medicare Annual/Subsequent preventive examination.  Review of Systems: N/A      I connected with the patient today by telephone and verified that I am speaking with the correct person using two identifiers. Location patient: home Location nurse: work Persons participating in the telephone visit: patient, nurse.   I discussed the limitations, risks, security and privacy concerns of performing an evaluation and management service by telephone and the availability of in person appointments. I also discussed with the patient that there may be a patient responsible charge related to this service. The patient expressed understanding and verbally consented to this telephonic visit.              Objective:    There were no vitals filed for this visit. There is no height or weight on file to calculate BMI.  Advanced Directives 04/10/2020 03/03/2019 02/14/2019 11/29/2018 02/08/2018 02/05/2017 11/22/2015  Does Patient Have a Medical Advance Directive? No Yes No No No No No  Type of Advance Directive - Healthcare Power of [place];Living will - - - - -  Copy of [place] in Chart? - No - copy requested - - - - -  Would patient like information on creating a medical advance directive? No - Patient declined - No - Guardian declined No - Patient declined No - Patient declined No - Patient declined No - patient declined information    Current Medications (verified) Outpatient Encounter Medications as of 05/22/2020  Medication Sig  . aspirin (ASPIRIN LOW DOSE) 81 MG EC tablet TAKE 1 TABLET(81 MG) BY MOUTH DAILY. SWALLOW WHOLE  . carvedilol (COREG) 3.125 MG tablet Take 1 tablet (3.125 mg total) by mouth 2 (two) times daily with a meal.  . ezetimibe (ZETIA) 10 MG tablet Take 1 tablet (10 mg total) by mouth daily.  . finasteride (PROSCAR) 5 MG tablet TAKE 1 TABLET(5 MG) BY MOUTH DAILY  . losartan (COZAAR) 25 MG tablet  Take 1 tablet (25 mg total) by mouth daily. For blood pressure  . rosuvastatin (CRESTOR) 40 MG tablet TAKE 1 TABLET(40 MG) BY MOUTH DAILY FOR CHOLESTEROL  . spironolactone (ALDACTONE) 25 MG tablet Take 0.5 tablets (12.5 mg total) by mouth daily.   No facility-administered encounter medications on file as of 05/22/2020.    Allergies (verified) Patient has no known allergies.   History: Past Medical History:  Diagnosis Date  . Acute systolic CHF (congestive heart failure) ([place])    a. echo 11/21/15: EF <20%, mildly dilated LV, severe diffuse global HK, GR3DD, mod MR, LA mildly dilated, RV sys fxn mildly reduced, PASP 52 mmHg  . Anemia   . CAD (coronary artery disease)    a. cardiac cath 11/22/15: dLM 40%, pLAD 80-90%, mLAD sequential 40%, D2 90%, mLCx 90% at takeoff of OM1, RCA appeared occluded proximally, LVEF 20%, PCWP 29   . Chickenpox   . Chronic diastolic CHF (congestive heart failure) ([place])   . HLD (hyperlipidemia)   . Hypertension   . Pre-diabetes   . Prostate cancer [facility])    Past Surgical History:  Procedure Laterality Date  . CARDIAC CATHETERIZATION N/A 11/22/2015   Procedure: Right/Left Heart Cath and Coronary Angiography;  Surgeon: Minna Merritts, MD;  Location: [place] [facility];  Service: Cardiovascular;  Laterality: N/A;  . CARDIAC CATHETERIZATION Left 11/29/2015   Procedure: LEFT FEMORAL ARTERIAL LINE INSERTION;  Surgeon: Gaye Pollack, MD;  Location: [place];  Service:  Open Heart Surgery;  Laterality: Left;  . COLONOSCOPY WITH PROPOFOL N/A 02/14/2019   Procedure: COLONOSCOPY WITH PROPOFOL;  Surgeon: Toledo, Benay Pike, MD;  Location: [facility] ENDOSCOPY;  Service: Gastroenterology;  Laterality: N/A;  . CORONARY ARTERY BYPASS GRAFT N/A 11/29/2015   Procedure: CORONARY ARTERY BYPASS GRAFTING (CABG) x four, using left internal mammary artery and right leg saphenous vein harvested endoscopically;  Surgeon: Gaye Pollack, MD;  Location: [place] OR;  Service: Open Heart Surgery;   Laterality: N/A;  . TEE WITHOUT CARDIOVERSION N/A 11/29/2015   Procedure: TRANSESOPHAGEAL ECHOCARDIOGRAM (TEE);  Surgeon: Gaye Pollack, MD;  Location: [place];  Service: Open Heart Surgery;  Laterality: N/A;  . TURP VAPORIZATION     Family History  Problem Relation Age of Onset  . Hypertension Mother   . Healthy Father    Social History   Socioeconomic History  . Marital status: Married    Spouse name: Not on file  . Number of children: Not on file  . Years of education: Not on file  . Highest education level: Not on file  Occupational History  . Not on file  Tobacco Use  . Smoking status: Never Smoker  . Smokeless tobacco: Never Used  Vaping Use  . Vaping Use: Never used  Substance and Sexual Activity  . Alcohol use: No  . Drug use: No  . Sexual activity: Not Currently  Other Topics Concern  . Not on file  Social History Narrative   Married. Lives with his wife.   2 children, 2 grandchildren.   Retired. Worked as a Furniture conservator/restorer.   Enjoys fishing.    Social Determinants of Health   Financial Resource Strain: Not on file  Food Insecurity: Not on file  Transportation Needs: Not on file  Physical Activity: Not on file  Stress: Not on file  Social Connections: Not on file    Tobacco Counseling Counseling given: Not Answered   Clinical Intake:                 Diabetic: No Nutrition Risk Assessment:  Has the patient had any N/V/D within the last 2 months?  {YES/NO:21197} Does the patient have any non-healing wounds?  {YES/NO:21197} Has the patient had any unintentional weight loss or weight gain?  {YES/NO:21197}  Diabetes:  Is the patient diabetic?  No  If diabetic, was a CBG obtained today?  N/A Did the patient bring in their glucometer from home?  N/A How often do you monitor your CBG's? N/A.   Financial Strains and Diabetes Management:  Are you having any financial strains with the device, your supplies or your medication? N/A.  Does the  patient want to be seen by Chronic Care Management for management of their diabetes?  N/A Would the patient like to be referred to a Nutritionist or for Diabetic Management?  N/A         Activities of Daily Living No flowsheet data found.  Patient Care Team: Pleas Koch, NP as PCP - General (Internal Medicine) Jerline Pain, MD as PCP - Cardiology (Cardiology)  Indicate any recent Medical Services you may have received from other than [facility] providers in the past year (date may be approximate).     Assessment:   This is a routine wellness examination for [place].  Hearing/Vision screen No exam data present  Dietary issues and exercise activities discussed:    Goals    . DIET - INCREASE WATER INTAKE     Starting 02/08/2018, I will continue  to drink at least 1 gallon of water daily.     . Patient Stated     03/03/2019, I will maintain and continue medications as prescribed.       Depression Screen PHQ 2/9 Scores 03/03/2019 02/08/2018 02/05/2017  PHQ - 2 Score 0 0 0  PHQ- 9 Score 0 0 0    Fall Risk Fall Risk  03/03/2019 02/08/2018 02/05/2017  Falls in the past year? 0 0 No  Number falls in past yr: 0 - -  Injury with Fall? 0 - -  Risk for fall due to : Medication side effect - -  Follow up Falls evaluation completed;Falls prevention discussed - -    FALL RISK PREVENTION PERTAINING TO THE HOME:  Any stairs in or around the home? Yes  If so, are there any without handrails? No  Home free of loose throw rugs in walkways, pet beds, electrical cords, etc? Yes  Adequate lighting in your home to reduce risk of falls? Yes   ASSISTIVE DEVICES UTILIZED TO PREVENT FALLS:  Life alert? {YES/NO:21197} Use of a cane, walker or w/c? {YES/NO:21197} Grab bars in the bathroom? {YES/NO:21197} Shower chair or bench in shower? {YES/NO:21197} Elevated toilet seat or a handicapped toilet? {YES/NO:21197}  TIMED UP AND GO:  Was the test performed? N/A telephone visit.     Cognitive Function: MMSE - Mini Mental State Exam 03/03/2019 02/08/2018 02/05/2017  Not completed: Refused - -  Orientation to time - 5 5  Orientation to Place - 5 5  Registration - 3 3  Attention/ Calculation - 0 0  Recall - 3 3  Language- name 2 objects - 0 0  Language- repeat - 1 1  Language- follow 3 step command - 3 3  Language- read & follow direction - 0 0  Write a sentence - 0 0  Copy design - 0 0  Total score - 20 20  Mini Cog  Mini-Cog screen was completed. Maximum score is 22. A value of 0 denotes this part of the MMSE was not completed or the patient failed this part of the Mini-Cog screening.       Immunizations Immunization History  Administered Date(s) Administered  . Fluad Quad(high Dose 65+) 03/04/2020  . Influenza, High Dose Seasonal PF 10/29/2015, 10/19/2016, 12/29/2017, 11/22/2018  . Influenza-Unspecified 10/24/2016, 12/29/2017  . PPD Test 01/21/2017  . Pneumococcal Conjugate-13 10/29/2015, 02/05/2017  . Pneumococcal Polysaccharide-23 02/05/2017    TDAP status: Due, Education has been provided regarding the importance of this vaccine. Advised may receive this vaccine at local pharmacy or Health Dept. Aware to provide a copy of the vaccination record if obtained from local pharmacy or Health Dept. Verbalized acceptance and understanding.  Flu Vaccine status: Up to date  Pneumococcal vaccine status: Up to date  {Covid-19 vaccine status:2101808}  Qualifies for Shingles Vaccine? Yes   Zostavax completed Yes   Shingrix Completed?: No.    Education has been provided regarding the importance of this vaccine. Patient has been advised to call insurance company to determine out of pocket expense if they have not yet received this vaccine. Advised may also receive vaccine at local pharmacy or Health Dept. Verbalized acceptance and understanding.  Screening Tests Health Maintenance  Topic Date Due  . COVID-19 Vaccine (1) Never done  . TETANUS/TDAP  Never  done  . INFLUENZA VACCINE  Completed  . Hepatitis C Screening  Completed  . PNA vac Low Risk Adult  Completed  . HPV VACCINES  Aged Out  Health Maintenance  Health Maintenance Due  Topic Date Due  . COVID-19 Vaccine (1) Never done  . TETANUS/TDAP  Never done    Colorectal cancer screening: No longer required.   Lung Cancer Screening: (Low Dose CT Chest recommended if Age 35-80 years, 30 pack-year currently smoking OR have quit w/in 15 years.) does not qualify.    Additional Screening:  Hepatitis C Screening: does qualify; Completed 11/29/2018  Vision Screening: Recommended annual ophthalmology exams for early detection of glaucoma and other disorders of the eye. Is the patient up to date with their annual eye exam?  {YES/NO:21197} Who is the provider or what is the name of the office in which the patient attends annual eye exams? *** If pt is not established with a provider, would they like to be referred to a provider to establish care? No .   Dental Screening: Recommended annual dental exams for proper oral hygiene  Community Resource Referral / Chronic Care Management: CRR required this visit?  No   CCM required this visit?  No      Plan:     I have personally reviewed and noted the following in the patient's chart:   . Medical and social history . Use of alcohol, tobacco or illicit drugs  . Current medications and supplements . Functional ability and status . Nutritional status . Physical activity . Advanced directives . List of other physicians . Hospitalizations, surgeries, and ER visits in previous 12 months . Vitals . Screenings to include cognitive, depression, and falls . Referrals and appointments  In addition, I have reviewed and discussed with patient certain preventive protocols, quality metrics, and best practice recommendations. A written personalized care plan for preventive services as well as general preventive health recommendations were  provided to patient.   Due to this being a telephonic visit, the after visit summary with patients personalized plan was offered to patient via office or my-chart. Patient preferred to pick up at office at next visit or via mychart.   Andrez Grime, LPN   8/88/2800

## 2020-05-22 NOTE — Telephone Encounter (Signed)
Called patient 3 times trying to complete his AWV. Patient never answered. Left message notifying patient I called and to call and reschedule at his convenience.

## 2020-05-22 NOTE — Telephone Encounter (Signed)
Left VM requesting pt call back to reschedule missed appt on 2/24.

## 2020-06-06 ENCOUNTER — Telehealth: Payer: Self-pay | Admitting: Primary Care

## 2020-06-06 NOTE — Telephone Encounter (Signed)
Yes, please route to cardiologist.

## 2020-06-06 NOTE — Telephone Encounter (Signed)
This was not given by our office. Do you want me to call patient and let them know to call ordering providers office.

## 2020-06-06 NOTE — Telephone Encounter (Signed)
  LAST APPOINTMENT DATE: 05/22/2020   NEXT APPOINTMENT DATE:@Visit  date not found  MEDICATION: Spironolactone * pat has had dosage change and the pharmacy will not fill it cause the mg are incorrect.*  [place] patient know to contact pharmacy at the end of the day to make sure medication is ready.  Please notify patient to allow 48-72 hours to process  Encourage patient to contact the pharmacy for refills or they can request refills through [place]:   LAST REFILL:  QTY:  REFILL DATE:    OTHER COMMENTS:    Okay for refill?  Please advise

## 2020-06-07 NOTE — Telephone Encounter (Signed)
Called patient to let know needs to get from cardiology. Phone rang several times then d/c. No voicemail picked up. Will call back later.

## 2020-08-09 ENCOUNTER — Other Ambulatory Visit: Payer: Self-pay

## 2020-08-09 ENCOUNTER — Other Ambulatory Visit: Payer: Medicare HMO | Admitting: *Deleted

## 2020-08-09 DIAGNOSIS — E7800 Pure hypercholesterolemia, unspecified: Secondary | ICD-10-CM

## 2020-08-09 DIAGNOSIS — Z79899 Other long term (current) drug therapy: Secondary | ICD-10-CM

## 2020-08-09 LAB — LIPID PANEL
Chol/HDL Ratio: 3.2 ratio (ref 0.0–5.0)
Cholesterol, Total: 164 mg/dL (ref 100–199)
HDL: 51 mg/dL (ref >39)
LDL Chol Calc (NIH): 100 mg/dL — ABNORMAL HIGH (ref 0–99)
Triglycerides: 66 mg/dL (ref 0–149)
VLDL Cholesterol Cal: 13 mg/dL (ref 5–40)

## 2020-08-09 LAB — ALT: ALT: 9 IU/L (ref 0–44)

## 2020-08-22 ENCOUNTER — Encounter: Payer: Self-pay | Admitting: *Deleted

## 2020-09-04 ENCOUNTER — Other Ambulatory Visit: Payer: Self-pay | Admitting: *Deleted

## 2020-09-04 MED ORDER — ASPIRIN 81 MG PO TBEC: DELAYED_RELEASE_TABLET | ORAL | 2 refills | Status: DC

## 2020-09-04 MED ORDER — EZETIMIBE 10 MG PO TABS: 10.0000 mg | ORAL_TABLET | Freq: Every day | ORAL | 0 refills | Status: DC

## 2020-09-11 ENCOUNTER — Ambulatory Visit: Payer: Medicare HMO

## 2020-09-11 ENCOUNTER — Telehealth: Payer: Self-pay

## 2020-09-11 NOTE — Progress Notes (Deleted)
Subjective:   Shane Kim is a 75 y.o. male who presents for Medicare Annual/Subsequent preventive examination.  Review of Systems: N/A      I connected with the patient today by telephone and verified that I am speaking with the correct person using two identifiers. Location patient: home Location nurse: work Persons participating in the telephone visit: patient, nurse.   I discussed the limitations, risks, security and privacy concerns of performing an evaluation and management service by telephone and the availability of in person appointments. I also discussed with the patient that there may be a patient responsible charge related to this service. The patient expressed understanding and verbally consented to this telephonic visit.              Objective:    There were no vitals filed for this visit. There is no height or weight on file to calculate BMI.  Advanced Directives 04/10/2020 03/03/2019 02/14/2019 11/29/2018 02/08/2018 02/05/2017 11/22/2015  Does Patient Have a Medical Advance Directive? No Yes No No No No No  Type of Advance Directive - Healthcare Power of [place];Living will - - - - -  Copy of [place] in Chart? - No - copy requested - - - - -  Would patient like information on creating a medical advance directive? No - Patient declined - No - Guardian declined No - Patient declined No - Patient declined No - Patient declined No - patient declined information    Current Medications (verified) Outpatient Encounter Medications as of 09/11/2020  Medication Sig   aspirin (ASPIRIN LOW DOSE) 81 MG EC tablet TAKE 1 TABLET(81 MG) BY MOUTH DAILY. SWALLOW WHOLE   carvedilol (COREG) 3.125 MG tablet Take 1 tablet (3.125 mg total) by mouth 2 (two) times daily with a meal.   ezetimibe (ZETIA) 10 MG tablet Take 1 tablet (10 mg total) by mouth daily.   finasteride (PROSCAR) 5 MG tablet TAKE 1 TABLET(5 MG) BY MOUTH DAILY   losartan (COZAAR) 25 MG tablet Take  1 tablet (25 mg total) by mouth daily. For blood pressure   rosuvastatin (CRESTOR) 40 MG tablet TAKE 1 TABLET(40 MG) BY MOUTH DAILY FOR CHOLESTEROL   spironolactone (ALDACTONE) 25 MG tablet Take 0.5 tablets (12.5 mg total) by mouth daily.   No facility-administered encounter medications on file as of 09/11/2020.    Allergies (verified) Patient has no known allergies.   History: Past Medical History:  Diagnosis Date   Acute systolic CHF (congestive heart failure) ([place])    a. echo 11/21/15: EF <20%, mildly dilated LV, severe diffuse global HK, GR3DD, mod MR, LA mildly dilated, RV sys fxn mildly reduced, PASP 52 mmHg   Anemia    CAD (coronary artery disease)    a. cardiac cath 11/22/15: dLM 40%, pLAD 80-90%, mLAD sequential 40%, D2 90%, mLCx 90% at takeoff of OM1, RCA appeared occluded proximally, LVEF 20%, PCWP 29    Chickenpox    Chronic diastolic CHF (congestive heart failure) ([facility])    HLD (hyperlipidemia)    Hypertension    Pre-diabetes    Prostate cancer [facility])    Past Surgical History:  Procedure Laterality Date   CARDIAC CATHETERIZATION N/A 11/22/2015   Procedure: Right/Left Heart Cath and Coronary Angiography;  Surgeon: Minna Merritts, MD;  Location: [facility];  Service: Cardiovascular;  Laterality: N/A;   CARDIAC CATHETERIZATION Left 11/29/2015   Procedure: LEFT FEMORAL ARTERIAL LINE INSERTION;  Surgeon: Gaye Pollack, MD;  Location: [facility];  Service:  Open Heart Surgery;  Laterality: Left;   COLONOSCOPY WITH PROPOFOL N/A 02/14/2019   Procedure: COLONOSCOPY WITH PROPOFOL;  Surgeon: Toledo, Benay Pike, MD;  Location: [facility];  Service: Gastroenterology;  Laterality: N/A;   CORONARY ARTERY BYPASS GRAFT N/A 11/29/2015   Procedure: CORONARY ARTERY BYPASS GRAFTING (CABG) x four, using left internal mammary artery and right leg saphenous vein harvested endoscopically;  Surgeon: Gaye Pollack, MD;  Location: [place];  Service: Open Heart Surgery;  Laterality: N/A;   TEE  WITHOUT CARDIOVERSION N/A 11/29/2015   Procedure: TRANSESOPHAGEAL ECHOCARDIOGRAM (TEE);  Surgeon: Gaye Pollack, MD;  Location: [facility];  Service: Open Heart Surgery;  Laterality: N/A;   TURP VAPORIZATION     Family History  Problem Relation Age of Onset   Hypertension Mother    Healthy Father    Social History   Socioeconomic History   Marital status: Married    Spouse name: Not on file   Number of children: Not on file   Years of education: Not on file   Highest education level: Not on file  Occupational History   Not on file  Tobacco Use   Smoking status: Never   Smokeless tobacco: Never  Vaping Use   Vaping Use: Never used  Substance and Sexual Activity   Alcohol use: No   Drug use: No   Sexual activity: Not Currently  Other Topics Concern   Not on file  Social History Narrative   Married. Lives with his wife.   2 children, 2 grandchildren.   Retired. Worked as a Furniture conservator/restorer.   Enjoys fishing.    Social Determinants of Health   Financial Resource Strain: Not on file  Food Insecurity: Not on file  Transportation Needs: Not on file  Physical Activity: Not on file  Stress: Not on file  Social Connections: Not on file    Tobacco Counseling Counseling given: Not Answered   Clinical Intake:                 Diabetic: No Nutrition Risk Assessment:  Has the patient had any N/V/D within the last 2 months?  {YES/NO:21197} Does the patient have any non-healing wounds?  {YES/NO:21197} Has the patient had any unintentional weight loss or weight gain?  {YES/NO:21197}  Diabetes:  Is the patient diabetic?  No  If diabetic, was a CBG obtained today?   N/A Did the patient bring in their glucometer from home?   N/A How often do you monitor your CBG's? N/A.   Financial Strains and Diabetes Management:  Are you having any financial strains with the device, your supplies or your medication?  N/A .  Does the patient want to be seen by Chronic Care Management for  management of their diabetes?   N/A Would the patient like to be referred to a Nutritionist or for Diabetic Management?   N/A          Activities of Daily Living No flowsheet data found.  Patient Care Team: Pleas Koch, NP as PCP - General (Internal Medicine) Jerline Pain, MD as PCP - Cardiology (Cardiology)  Indicate any recent Medical Services you may have received from other than [facility] providers in the past year (date may be approximate).     Assessment:   This is a routine wellness examination for [place].  Hearing/Vision screen No results found.  Dietary issues and exercise activities discussed:     Goals Addressed   None    Depression Screen PHQ 2/9 Scores 03/03/2019  02/08/2018 02/05/2017  PHQ - 2 Score 0 0 0  PHQ- 9 Score 0 0 0    Fall Risk Fall Risk  03/03/2019 02/08/2018 02/05/2017  Falls in the past year? 0 0 No  Number falls in past yr: 0 - -  Injury with Fall? 0 - -  Risk for fall due to : Medication side effect - -  Follow up Falls evaluation completed;Falls prevention discussed - -    FALL RISK PREVENTION PERTAINING TO THE HOME:  Any stairs in or around the home? Yes  If so, are there any without handrails? No  Home free of loose throw rugs in walkways, pet beds, electrical cords, etc? Yes  Adequate lighting in your home to reduce risk of falls? Yes   ASSISTIVE DEVICES UTILIZED TO PREVENT FALLS:  Life alert? {YES/NO:21197} Use of a cane, walker or w/c? {YES/NO:21197} Grab bars in the bathroom? {YES/NO:21197} Shower chair or bench in shower? {YES/NO:21197} Elevated toilet seat or a handicapped toilet? {YES/NO:21197}  TIMED UP AND GO:  Was the test performed?  N/A telephone visit .   Cognitive Function: MMSE - Mini Mental State Exam 03/03/2019 02/08/2018 02/05/2017  Not completed: Refused - -  Orientation to time - 5 5  Orientation to Place - 5 5  Registration - 3 3  Attention/ Calculation - 0 0  Recall - 3 3  Language- name 2  objects - 0 0  Language- repeat - 1 1  Language- follow 3 step command - 3 3  Language- read & follow direction - 0 0  Write a sentence - 0 0  Copy design - 0 0  Total score - 20 20  Mini Cog  Mini-Cog screen was completed. Maximum score is 22. A value of 0 denotes this part of the MMSE was not completed or the patient failed this part of the Mini-Cog screening.       Immunizations Immunization History  Administered Date(s) Administered   Fluad Quad(high Dose 65+) 03/04/2020   Influenza, High Dose Seasonal PF 10/29/2015, 10/19/2016, 12/29/2017, 11/22/2018   Influenza-Unspecified 10/24/2016, 12/29/2017   PPD Test 01/21/2017   Pneumococcal Conjugate-13 10/29/2015, 02/05/2017   Pneumococcal Polysaccharide-23 02/05/2017    TDAP status: Due, Education has been provided regarding the importance of this vaccine. Advised may receive this vaccine at local pharmacy or Health Dept. Aware to provide a copy of the vaccination record if obtained from local pharmacy or Health Dept. Verbalized acceptance and understanding.  Flu Vaccine status: Up to date  Pneumococcal vaccine status: Up to date  {Covid-19 vaccine status:2101808}  Qualifies for Shingles Vaccine? Yes   Zostavax completed No   Shingrix Completed?: No.    Education has been provided regarding the importance of this vaccine. Patient has been advised to call insurance company to determine out of pocket expense if they have not yet received this vaccine. Advised may also receive vaccine at local pharmacy or Health Dept. Verbalized acceptance and understanding.  Screening Tests Health Maintenance  Topic Date Due   COVID-19 Vaccine (1) Never done   TETANUS/TDAP  Never done   Zoster Vaccines- Shingrix (1 of 2) Never done   INFLUENZA VACCINE  09/24/2020   Hepatitis C Screening  Completed   PNA vac Low Risk Adult  Completed   HPV VACCINES  Aged Out    Health Maintenance  Health Maintenance Due  Topic Date Due   COVID-19  Vaccine (1) Never done   TETANUS/TDAP  Never done   Zoster Vaccines- Shingrix (1  of 2) Never done    Colorectal cancer screening: No longer required.   Lung Cancer Screening: (Low Dose CT Chest recommended if Age 70-80 years, 30 pack-year currently smoking OR have quit w/in 15years.) does not qualify.  Additional Screening:  Hepatitis C Screening: does qualify; Completed 11/29/2018  Vision Screening: Recommended annual ophthalmology exams for early detection of glaucoma and other disorders of the eye. Is the patient up to date with their annual eye exam?  {YES/NO:21197} Who is the provider or what is the name of the office in which the patient attends annual eye exams? *** If pt is not established with a provider, would they like to be referred to a provider to establish care? No .   Dental Screening: Recommended annual dental exams for proper oral hygiene  Community Resource Referral / Chronic Care Management: CRR required this visit?  No   CCM required this visit?  No      Plan:     I have personally reviewed and noted the following in the patient's chart:   Medical and social history Use of alcohol, tobacco or illicit drugs  Current medications and supplements including opioid prescriptions. Patient is not currently taking opioid prescriptions. Functional ability and status Nutritional status Physical activity Advanced directives List of other physicians Hospitalizations, surgeries, and ER visits in previous 12 months Vitals Screenings to include cognitive, depression, and falls Referrals and appointments  In addition, I have reviewed and discussed with patient certain preventive protocols, quality metrics, and best practice recommendations. A written personalized care plan for preventive services as well as general preventive health recommendations were provided to patient.   Due to this being a telephonic visit, the after visit summary with patients personalized  plan was offered to patient via office or my-chart. Patient preferred to pick up at office at next visit or via mychart.   Andrez Grime, LPN   07/28/7996

## 2020-09-11 NOTE — Telephone Encounter (Signed)
Called patient 3 times trying to complete his AWV. Patient never answered. Left message notifying patient I called and to call and reschedule at his convenience.

## 2020-10-12 ENCOUNTER — Other Ambulatory Visit: Payer: Self-pay | Admitting: Primary Care

## 2020-10-12 ENCOUNTER — Other Ambulatory Visit: Payer: Self-pay | Admitting: Cardiology

## 2020-10-12 DIAGNOSIS — C61 Malignant neoplasm of prostate: Secondary | ICD-10-CM

## 2020-10-12 DIAGNOSIS — E785 Hyperlipidemia, unspecified: Secondary | ICD-10-CM

## 2020-10-12 DIAGNOSIS — I5022 Chronic systolic (congestive) heart failure: Secondary | ICD-10-CM

## 2020-10-12 DIAGNOSIS — E7800 Pure hypercholesterolemia, unspecified: Secondary | ICD-10-CM

## 2020-10-12 DIAGNOSIS — Z951 Presence of aortocoronary bypass graft: Secondary | ICD-10-CM

## 2020-10-12 DIAGNOSIS — I2510 Atherosclerotic heart disease of native coronary artery without angina pectoris: Secondary | ICD-10-CM

## 2020-10-16 ENCOUNTER — Telehealth: Payer: Self-pay | Admitting: Primary Care

## 2020-10-16 NOTE — Chronic Care Management (AMB) (Signed)
  Chronic Care Management   Outreach Note  10/16/2020 Name: Shane Kim MRN: [id number] DOB: [birth year]  Referred by: Pleas Koch, NP Reason for referral : No chief complaint on file.   An unsuccessful telephone outreach was attempted today. The patient was referred to the pharmacist for assistance with care management and care coordination.   Follow Up Plan:   Tatjana Dellinger Upstream Scheduler

## 2020-10-23 ENCOUNTER — Telehealth: Payer: Self-pay | Admitting: Primary Care

## 2020-10-23 NOTE — Chronic Care Management (AMB) (Signed)
  Chronic Care Management   Outreach Note  10/23/2020 Name: Shane Kim MRN: [id number] DOB: 11-May-1945  Referred by: Pleas Koch, NP Reason for referral : No chief complaint on file.   A second unsuccessful telephone outreach was attempted today. The patient was referred to pharmacist for assistance with care management and care coordination.  Follow Up Plan:   SIGNATURE

## 2020-11-27 ENCOUNTER — Other Ambulatory Visit: Payer: Self-pay

## 2020-11-27 ENCOUNTER — Ambulatory Visit (HOSPITAL_BASED_OUTPATIENT_CLINIC_OR_DEPARTMENT_OTHER): Payer: Medicare HMO | Admitting: Cardiology

## 2020-11-27 DIAGNOSIS — E7800 Pure hypercholesterolemia, unspecified: Secondary | ICD-10-CM

## 2020-11-27 DIAGNOSIS — I5022 Chronic systolic (congestive) heart failure: Secondary | ICD-10-CM | POA: Diagnosis not present

## 2020-11-27 DIAGNOSIS — I2510 Atherosclerotic heart disease of native coronary artery without angina pectoris: Secondary | ICD-10-CM

## 2020-11-27 NOTE — Patient Instructions (Signed)
Medication Instructions:  The current medical regimen is effective;  continue present plan and medications.  *If you need a refill on your cardiac medications before your next appointment, please call your pharmacy*  Follow-Up: At [facility], you and your health needs are our priority.  As part of our continuing mission to provide you with exceptional heart care, we have created designated Provider Care Teams.  These Care Teams include your primary Cardiologist (physician) and Advanced Practice Providers (APPs -  Physician Assistants and Nurse Practitioners) who all work together to provide you with the care you need, when you need it.  We recommend signing up for the patient portal called "MyChart".  Sign up information is provided on this After Visit Summary.  MyChart is used to connect with patients for Virtual Visits (Telemedicine).  Patients are able to view lab/test results, encounter notes, upcoming appointments, etc.  Non-urgent messages can be sent to your provider as well.   To learn more about what you can do with MyChart, go to [email or web address].    Your next appointment:   1 year(s)  The format for your next appointment:   In Person  Provider:   Mark Skains, MD   Thank you for choosing [facility]!!    

## 2020-11-27 NOTE — Assessment & Plan Note (Signed)
EF originally 20, now 45-50.  NYHA class I.  Doing quite well.  Walking.  No chest pain.  Continue with carvedilol, however we have clarified dose of 3.125 mg twice a day.  Losartan Aldactone.  Even though blood pressures are fairly soft, no syncope no dizziness.

## 2020-11-27 NOTE — Assessment & Plan Note (Signed)
Originally of 20.  Overall doing quite well currently.  Most recently EF 50%.  Continue with low doses of medications, could not tolerate because of hypotension.

## 2020-11-27 NOTE — Progress Notes (Signed)
Cardiology Office Note:    Date:  11/27/2020   ID:  Shane Kim, DOB 1945/03/08, MRN [id number]  PCP:  Pleas Koch, NP   [place]  Cardiologist:  Candee Furbish, MD  Advanced Practice Provider:  No care team member to display Electrophysiologist:  None       Referring MD: Pleas Koch, NP    History of Present Illness:    Shane Kim is a 75 y.o. male here for the follow-up of congestive heart failure, coronary artery disease, and hyperlipidemia.  Ischemic cardiomyopathy coronary artery disease prior CABG 2017 EF 20%.  Last EF 50%.  At his last appointment, overall been doing quite well.  Has been working as a crossing guard.  No chest pain no shortness of breath.  BP has been soft.  No fainting however.  No orthopnea no PND.  Today: Overall he is feeling well. 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  He denies any palpitations, chest pain, or shortness of breath. No lightheadedness, headaches, syncope, orthopnea, or PND. Also has no lower extremity edema or exertional symptoms.  Past Medical History:  Diagnosis Date   Acute systolic CHF (congestive heart failure) ([place])    a. echo 11/21/15: EF <20%, mildly dilated LV, severe diffuse global HK, GR3DD, mod MR, LA mildly dilated, RV sys fxn mildly reduced, PASP 52 mmHg   Anemia    CAD (coronary artery disease)    a. cardiac cath 11/22/15: dLM 40%, pLAD 80-90%, mLAD sequential 40%, D2 90%, mLCx 90% at takeoff of OM1, RCA appeared occluded proximally, LVEF 20%, PCWP 29    Chickenpox    Chronic diastolic CHF (congestive heart failure) (HCC)    HLD (hyperlipidemia)    Hypertension    Pre-diabetes    Prostate cancer [facility])     Past Surgical History:  Procedure Laterality Date   CARDIAC CATHETERIZATION N/A 11/22/2015   Procedure: Right/Left Heart Cath and Coronary Angiography;  Surgeon: Minna Merritts, MD;  Location: [place] CV LAB;  Service: Cardiovascular;   Laterality: N/A;   CARDIAC CATHETERIZATION Left 11/29/2015   Procedure: LEFT FEMORAL ARTERIAL LINE INSERTION;  Surgeon: Gaye Pollack, MD;  Location: [place];  Service: Open Heart Surgery;  Laterality: Left;   COLONOSCOPY WITH PROPOFOL N/A 02/14/2019   Procedure: COLONOSCOPY WITH PROPOFOL;  Surgeon: Toledo, Benay Pike, MD;  Location: [facility] ENDOSCOPY;  Service: Gastroenterology;  Laterality: N/A;   CORONARY ARTERY BYPASS GRAFT N/A 11/29/2015   Procedure: CORONARY ARTERY BYPASS GRAFTING (CABG) x four, using left internal mammary artery and right leg saphenous vein harvested endoscopically;  Surgeon: Gaye Pollack, MD;  Location: [place] OR;  Service: Open Heart Surgery;  Laterality: N/A;   TEE WITHOUT CARDIOVERSION N/A 11/29/2015   Procedure: TRANSESOPHAGEAL ECHOCARDIOGRAM (TEE);  Surgeon: Gaye Pollack, MD;  Location: [place];  Service: Open Heart Surgery;  Laterality: N/A;   TURP VAPORIZATION      Current Medications: Current Meds  Medication Sig   aspirin (ASPIRIN LOW DOSE) 81 MG EC tablet TAKE 1 TABLET(81 MG) BY MOUTH DAILY. SWALLOW WHOLE   carvedilol (COREG) 3.125 MG tablet TAKE 1 TABLET(3.125 MG) BY MOUTH TWICE DAILY WITH A MEAL   ezetimibe (ZETIA) 10 MG tablet Take 1 tablet (10 mg total) by mouth daily.   finasteride (PROSCAR) 5 MG tablet TAKE 1 TABLET(5 MG) BY MOUTH DAILY   losartan (COZAAR) 25 MG tablet Take 1 tablet (25 mg total) by mouth daily. For blood pressure  rosuvastatin (CRESTOR) 40 MG tablet TAKE 1 TABLET(40 MG) BY MOUTH DAILY FOR CHOLESTEROL   spironolactone (ALDACTONE) 25 MG tablet Take 0.5 tablets (12.5 mg total) by mouth daily.     Allergies:   Patient has no known allergies.   Social History   Socioeconomic History   Marital status: Married    Spouse name: Not on file   Number of children: Not on file   Years of education: Not on file   Highest education level: Not on file  Occupational History   Not on file  Tobacco Use   Smoking status: Never   Smokeless tobacco:  Never  Vaping Use   Vaping Use: Never used  Substance and Sexual Activity   Alcohol use: No   Drug use: No   Sexual activity: Not Currently  Other Topics Concern   Not on file  Social History Narrative   Married. Lives with his wife.   2 children, 2 grandchildren.   Retired. Worked as a Furniture conservator/restorer.   Enjoys fishing.    Social Determinants of Health   Financial Resource Strain: Not on file  Food Insecurity: Not on file  Transportation Needs: Not on file  Physical Activity: Not on file  Stress: Not on file  Social Connections: Not on file     Family History: The patient's family history includes Healthy in his father; Hypertension in his mother.  ROS:   Please see the history of present illness.    All other systems reviewed and are negative.  EKGs/Labs/Other Studies Reviewed:    The following studies were reviewed today:  ECHO IMPRESSIONS March 2020  1. The left ventricle has mildly reduced systolic function, with an  ejection fraction of 45-50%. The cavity size was mildly dilated. There is  mildly increased left ventricular wall thickness. Left ventricular  diastolic parameters were normal.   2. Mid and basal inferior wall akinesis distal septal hypokinesis.   3. The right ventricle has normal systolic function. The cavity was  normal. There is no increase in right ventricular wall thickness.   4. Left atrial size was mildly dilated.   5. The mitral valve is degenerative. Mild thickening of the mitral valve  leaflet. Mild calcification of the mitral valve leaflet. There is moderate  mitral annular calcification present.   6. The tricuspid valve is normal in structure.   7. The aortic valve is tricuspid Severely thickening of the aortic valve  Sclerosis without any evidence of stenosis of the aortic valve. Aortic  valve regurgitation is trivial by color flow Doppler.   8. The pulmonic valve was grossly normal. Pulmonic valve regurgitation is  mild by color flow  Doppler.   9. The aortic root is normal in size and structure.    RHC/LHC 11/22/2015: LM lesion, 40 %stenosed. Prox RCA to Dist RCA lesion, 100 %stenosed. Ost 2nd Mrg lesion, 100 %stenosed. 2nd Mrg lesion, 95 %stenosed. Ost LAD to Prox LAD lesion, 90 %stenosed. Ost 2nd Diag lesion, 90 %stenosed. Mid LAD lesion, 40 %stenosed. Dist LAD lesion, 40 %stenosed. Prox Cx lesion, 90 %stenosed. The left ventricular ejection fraction is less than 25% by visual estimate. There is severe left ventricular systolic dysfunction. Hemodynamic findings consistent with moderate pulmonary hypertension.  EKG:  EKG is personally reviewed and interpreted. 11/27/2020: EKG was not ordered today. 04/18/2020: sinus rhythm 67 with no other abnormalities.  Recent Labs: 04/10/2020: BUN 19; Creatinine, Ser 1.13; Hemoglobin 11.9; Platelets 144; Potassium 4.6; Sodium 139; TSH 1.215 08/09/2020: ALT 9  Recent Lipid Panel    Component Value Date/Time   CHOL 164 08/09/2020 0925   TRIG 66 08/09/2020 0925   HDL 51 08/09/2020 0925   CHOLHDL 3.2 08/09/2020 0925   CHOLHDL 3 03/10/2019 1114   VLDL 20.4 03/10/2019 1114   LDLCALC 100 (H) 08/09/2020 0925     Risk Assessment/Calculations:      Physical Exam:    VS:  BP 102/72 (BP Location: Right Arm, Patient Position: Sitting, Cuff Size: Large)   Pulse 85   Ht 6\' 3"  (1.905 m)   Wt 217 lb 6.4 oz (98.6 kg)   SpO2 96%   BMI 27.17 kg/m     Wt Readings from Last 3 Encounters:  11/27/20 217 lb 6.4 oz (98.6 kg)  04/18/20 223 lb (101.2 kg)  04/10/20 227 lb 4.8 oz (103.1 kg)     GEN: Well nourished, well developed in no acute distress HEENT: Normal NECK: No JVD; No carotid bruits LYMPHATICS: No lymphadenopathy CARDIAC: RRR, no murmurs, rubs, gallops, CABG scar RESPIRATORY:  Clear to auscultation without rales, wheezing or rhonchi  ABDOMEN: Soft, non-tender, non-distended MUSCULOSKELETAL:  No edema; No deformity  SKIN: Warm and dry NEUROLOGIC:  Alert and  oriented x 3 PSYCHIATRIC:  Normal affect   ASSESSMENT:    1. Coronary artery disease involving native coronary artery of native heart without angina pectoris   2. Chronic systolic congestive heart failure (HCC)   3. Pure hypercholesterolemia     PLAN:    In order of problems listed above: Coronary artery disease EF originally 20, now 45-50.  NYHA class I.  Doing quite well.  Walking.  No chest pain.  Continue with carvedilol, however we have clarified dose of 3.125 mg twice a day.  Losartan Aldactone.  Even though blood pressures are fairly soft, no syncope no dizziness.  Congestive heart failure (CHF) ([place]) Originally of 20.  Overall doing quite well currently.  Most recently EF 50%.  Continue with low doses of medications, could not tolerate because of hypotension.  HLD (hyperlipidemia) He is on both Crestor 40 as well as Zetia 10 mg.  Ideally would like to see LDL less than 70.  It was between 90 and 100.  Continue to work with diet, exercise.  Could consider PCSK9 inhibitor     Follow-up: 12 months.  Medication Adjustments/Labs and Tests Ordered: Current medicines are reviewed at length with the patient today.  Concerns regarding medicines are outlined above.   No orders of the defined types were placed in this encounter.  No orders of the defined types were placed in this encounter.  Patient Instructions  Medication Instructions:  The current medical regimen is effective;  continue present plan and medications.  *If you need a refill on your cardiac medications before your next appointment, please call your pharmacy*  Follow-Up: At [facility], you and your health needs are our priority.  As part of our continuing mission to provide you with exceptional heart care, we have created designated Provider Care Teams.  These Care Teams include your primary Cardiologist (physician) and Advanced Practice Providers (APPs -  Physician Assistants and Nurse Practitioners) who all  work together to provide you with the care you need, when you need it.  We recommend signing up for the patient portal called "MyChart".  Sign up information is provided on this After Visit Summary.  MyChart is used to connect with patients for Virtual Visits (Telemedicine).  Patients are able to view lab/test results, encounter notes, upcoming appointments,  etc.  Non-urgent messages can be sent to your provider as well.   To learn more about what you can do with MyChart, go to [email or web address].    Your next appointment:   1 year(s)  The format for your next appointment:   In Person  Provider:   Candee Furbish, MD   Thank you for choosing [facility]!!     I,Mathew Stumpf,acting as a scribe for Candee Furbish, MD.,have documented all relevant documentation on the behalf of Candee Furbish, MD,as directed by  Candee Furbish, MD while in the presence of Candee Furbish, MD.  I, Candee Furbish, MD, have reviewed all documentation for this visit. The documentation on 11/27/20 for the exam, diagnosis, procedures, and orders are all accurate and complete.   Signed, Candee Furbish, MD  11/27/2020 4:08 PM    [facility]

## 2020-11-27 NOTE — Assessment & Plan Note (Signed)
He is on both Crestor 40 as well as Zetia 10 mg.  Ideally would like to see LDL less than 70.  It was between 90 and 100.  Continue to work with diet, exercise.  Could consider PCSK9 inhibitor

## 2020-12-03 ENCOUNTER — Telehealth: Payer: Self-pay | Admitting: Primary Care

## 2020-12-03 NOTE — Chronic Care Management (AMB) (Signed)
  Chronic Care Management   Outreach Note  12/03/2020 Name: Shane Kim MRN: [id number] DOB: 02/03/1946  Referred by: Pleas Koch, NP Reason for referral : No chief complaint on file.   Third unsuccessful telephone outreach was attempted today. The patient was referred to the pharmacist for assistance with care management and care coordination.   Follow Up Plan:   Tatjana Dellinger Upstream Scheduler

## 2021-01-10 ENCOUNTER — Other Ambulatory Visit: Payer: Self-pay | Admitting: Cardiology

## 2021-01-10 ENCOUNTER — Other Ambulatory Visit: Payer: Self-pay | Admitting: Primary Care

## 2021-01-10 DIAGNOSIS — I5022 Chronic systolic (congestive) heart failure: Secondary | ICD-10-CM

## 2021-01-10 DIAGNOSIS — E785 Hyperlipidemia, unspecified: Secondary | ICD-10-CM

## 2021-01-10 DIAGNOSIS — C61 Malignant neoplasm of prostate: Secondary | ICD-10-CM

## 2021-01-10 DIAGNOSIS — E7800 Pure hypercholesterolemia, unspecified: Secondary | ICD-10-CM

## 2021-01-10 DIAGNOSIS — Z951 Presence of aortocoronary bypass graft: Secondary | ICD-10-CM

## 2021-01-10 DIAGNOSIS — I2510 Atherosclerotic heart disease of native coronary artery without angina pectoris: Secondary | ICD-10-CM

## 2021-01-22 ENCOUNTER — Other Ambulatory Visit: Payer: Self-pay | Admitting: Primary Care

## 2021-01-22 DIAGNOSIS — I420 Dilated cardiomyopathy: Secondary | ICD-10-CM

## 2021-02-14 ENCOUNTER — Other Ambulatory Visit: Payer: Self-pay | Admitting: Primary Care

## 2021-02-14 DIAGNOSIS — I420 Dilated cardiomyopathy: Secondary | ICD-10-CM

## 2021-02-14 NOTE — Telephone Encounter (Signed)
°  Encourage patient to contact the pharmacy for refills or they can request refills through [place]:  Please schedule appointment if longer than 1 year  NEXT APPOINTMENT DATE:no future appt  MEDICATION:spironolactone (ALDACTONE) 25 MG tablet// Pt called stating that pharmacy needs authorization that he should still be taking 0.5 tablet (12.5 mg total)   Is the patient out of medication?   PHARMACY:[facility] [place], [place] AT [facility]  Let patient know to contact pharmacy at the end of the day to make sure medication is ready.  Please notify patient to allow 48-72 hours to process  CLINICAL FILLS OUT ALL BELOW:   LAST REFILL:  QTY:  REFILL DATE:    OTHER COMMENTS:   Okay for refill?  Please advise

## 2021-02-14 NOTE — Telephone Encounter (Signed)
Filled by cardiology, please have him request refill through pharmacy which will notify cardiology.  He is also due to see me in February 2023, needs CPE.

## 2021-02-14 NOTE — Telephone Encounter (Signed)
Left message to return call to our office.  

## 2021-02-19 NOTE — Telephone Encounter (Signed)
Left message to return call to our office.  

## 2021-02-20 NOTE — Addendum Note (Signed)
Addended by: Carter Kitten on: 02/20/2021 11:09 AM   Modules accepted: Orders

## 2021-02-20 NOTE — Telephone Encounter (Signed)
Pt wife stated he was not home and that they will call at a later time to schedule AWV

## 2021-02-20 NOTE — Telephone Encounter (Signed)
Will forward refill request to Cardiology.  Please schedule Medicare Wellness with nurse and CPE with Anda Kraft for sometime after in February 2023.

## 2021-03-06 MED ORDER — SPIRONOLACTONE 25 MG PO TABS: 12.5000 mg | ORAL_TABLET | Freq: Every day | ORAL | 3 refills | Status: DC

## 2021-04-09 ENCOUNTER — Other Ambulatory Visit: Payer: Self-pay | Admitting: Primary Care

## 2021-04-09 DIAGNOSIS — C61 Malignant neoplasm of prostate: Secondary | ICD-10-CM

## 2021-04-09 DIAGNOSIS — E785 Hyperlipidemia, unspecified: Secondary | ICD-10-CM

## 2021-04-10 ENCOUNTER — Other Ambulatory Visit: Payer: Self-pay | Admitting: Primary Care

## 2021-04-10 DIAGNOSIS — I420 Dilated cardiomyopathy: Secondary | ICD-10-CM

## 2021-04-11 ENCOUNTER — Other Ambulatory Visit: Payer: Self-pay | Admitting: Primary Care

## 2021-04-11 DIAGNOSIS — I420 Dilated cardiomyopathy: Secondary | ICD-10-CM

## 2021-04-11 NOTE — Telephone Encounter (Signed)
Pt walked in office and said he need refill on medication losartan (COZAAR) 25 MG tablet he is all out. Also I made a cpe/lab appointment for him

## 2021-04-11 NOTE — Telephone Encounter (Signed)
I sent a refill prior to him coming into our office. He should have enough to get him to his appointment.

## 2021-04-11 NOTE — Telephone Encounter (Signed)
Patient is overdue for CPE/follow-up, needs to be scheduled ASAP, any slot, anytime.

## 2021-04-12 NOTE — Telephone Encounter (Signed)
Pt is scheduled for 3/3

## 2021-04-16 ENCOUNTER — Other Ambulatory Visit: Payer: Self-pay | Admitting: Primary Care

## 2021-04-16 DIAGNOSIS — C61 Malignant neoplasm of prostate: Secondary | ICD-10-CM

## 2021-04-17 ENCOUNTER — Other Ambulatory Visit: Payer: Self-pay | Admitting: Primary Care

## 2021-04-17 DIAGNOSIS — C61 Malignant neoplasm of prostate: Secondary | ICD-10-CM

## 2021-04-25 ENCOUNTER — Other Ambulatory Visit: Payer: Self-pay | Admitting: Primary Care

## 2021-04-25 DIAGNOSIS — E785 Hyperlipidemia, unspecified: Secondary | ICD-10-CM

## 2021-04-26 ENCOUNTER — Encounter: Payer: Medicare HMO | Admitting: Primary Care

## 2021-04-26 ENCOUNTER — Other Ambulatory Visit: Payer: Self-pay | Admitting: Primary Care

## 2021-04-26 DIAGNOSIS — E785 Hyperlipidemia, unspecified: Secondary | ICD-10-CM

## 2021-05-08 ENCOUNTER — Other Ambulatory Visit: Payer: Self-pay | Admitting: Primary Care

## 2021-05-08 DIAGNOSIS — I420 Dilated cardiomyopathy: Secondary | ICD-10-CM

## 2021-05-16 ENCOUNTER — Other Ambulatory Visit: Payer: Self-pay | Admitting: Primary Care

## 2021-05-16 DIAGNOSIS — C61 Malignant neoplasm of prostate: Secondary | ICD-10-CM

## 2021-05-22 ENCOUNTER — Other Ambulatory Visit: Payer: Self-pay

## 2021-05-22 ENCOUNTER — Ambulatory Visit (INDEPENDENT_AMBULATORY_CARE_PROVIDER_SITE_OTHER): Payer: Medicare HMO

## 2021-05-22 VITALS — Ht 75.0 in | Wt 217.0 lb

## 2021-05-22 DIAGNOSIS — Z0000 Encounter for general adult medical examination without abnormal findings: Secondary | ICD-10-CM

## 2021-05-22 NOTE — Progress Notes (Signed)
? ?Subjective:  ? Shane Kim is a 76 y.o. male who presents for Medicare Annual/Subsequent preventive examination. ?Virtual Visit via Telephone Note ? ?I connected with  [facility] on 05/22/21 at 10:30 AM EDT by telephone and verified that I am speaking with the correct person using two identifiers. ? ?Location: ?Patient: HOME ?Provider: [facility] ?Persons participating in the virtual visit: patient/Nurse Health Advisor ?  ?I discussed the limitations, risks, security and privacy concerns of performing an evaluation and management service by telephone and the availability of in person appointments. The patient expressed understanding and agreed to proceed. ? ?Interactive audio and video telecommunications were attempted between this nurse and patient, however failed, due to patient having technical difficulties OR patient did not have access to video capability.  We continued and completed visit with audio only. ? ?Some vital signs may be absent or patient reported.  ? ?Chriss Driver, LPN ? ?Review of Systems    ? ?Cardiac Risk Factors include: advanced age (>67mn, >>72women);dyslipidemia;male gender;sedentary lifestyle ? ?   ?Objective:  ?  ?Today's Vitals  ? 05/22/21 0939  ?Weight: 217 lb (98.4 kg)  ?Height: '6\' 3"'$  (1.905 m)  ? ?Body mass index is 27.12 kg/m?. ? ? ?  05/22/2021  ?  9:44 AM 04/10/2020  ? 10:00 AM 03/03/2019  ? 11:16 AM 02/14/2019  ? 10:59 AM 11/29/2018  ?  1:25 PM 02/08/2018  ? 10:41 AM 02/05/2017  ? 11:25 AM  ?Advanced Directives  ?Does Patient Have a Medical Advance Directive? No No Yes No No No No  ?Type of AScientist, physiologicalof APaint RockLiving will      ?Copy of HHadarin Chart?   No - copy requested      ?Would patient like information on creating a medical advance directive? No - Patient declined No - Patient declined  No - Guardian declined No - Patient declined No - Patient declined No - Patient declined  ? ? ?Current Medications  (verified) ?Outpatient Encounter Medications as of 05/22/2021  ?Medication Sig  ? aspirin (ASPIRIN LOW DOSE) 81 MG EC tablet TAKE 1 TABLET(81 MG) BY MOUTH DAILY. SWALLOW WHOLE  ? carvedilol (COREG) 3.125 MG tablet TAKE 1 TABLET(3.125 MG) BY MOUTH TWICE DAILY WITH A MEAL  ? ezetimibe (ZETIA) 10 MG tablet Take 1 tablet (10 mg total) by mouth daily.  ? finasteride (PROSCAR) 5 MG tablet TAKE 1 TABLET BY MOUTH EVERY DAY.... OFFICE VISIT REQUIRED FOR FURTHER REFILLS  ? losartan (COZAAR) 25 MG tablet TAKE 1 TABLET BY MOUTH EVERY DAY FOR BLOOD PRESSURE....OFFICE VISIT REQUIRED FOR FURTHER REFILLS  ? rosuvastatin (CRESTOR) 40 MG tablet Take 1 tablet (40 mg total) by mouth daily. For cholesterol. Office visit required for further refills.  ? spironolactone (ALDACTONE) 25 MG tablet Take 0.5 tablets (12.5 mg total) by mouth daily.  ? ?No facility-administered encounter medications on file as of 05/22/2021.  ? ? ?Allergies (verified) ?Patient has no known allergies.  ? ?History: ?Past Medical History:  ?Diagnosis Date  ? Acute systolic CHF (congestive heart failure) ([place]   ? a. echo 11/21/15: EF <20%, mildly dilated LV, severe diffuse global HK, GR3DD, mod MR, LA mildly dilated, RV sys fxn mildly reduced, PASP 52 mmHg  ? Anemia   ? CAD (coronary artery disease)   ? a. cardiac cath 11/22/15: dLM 40%, pLAD 80-90%, mLAD sequential 40%, D2 90%, mLCx 90% at takeoff of OM1, RCA appeared occluded proximally, LVEF 20%, PCWP 29   ?  Chickenpox   ? Chronic diastolic CHF (congestive heart failure) ([place])   ? HLD (hyperlipidemia)   ? Hypertension   ? Pre-diabetes   ? Prostate cancer ([place])   ? ?Past Surgical History:  ?Procedure Laterality Date  ? CARDIAC CATHETERIZATION N/A 11/22/2015  ? Procedure: Right/Left Heart Cath and Coronary Angiography;  Surgeon: Minna Merritts, MD;  Location: [place] CV LAB;  Service: Cardiovascular;  Laterality: N/A;  ? CARDIAC CATHETERIZATION Left 11/29/2015  ? Procedure: LEFT FEMORAL ARTERIAL LINE INSERTION;   Surgeon: Gaye Pollack, MD;  Location: [place];  Service: Open Heart Surgery;  Laterality: Left;  ? COLONOSCOPY WITH PROPOFOL N/A 02/14/2019  ? Procedure: COLONOSCOPY WITH PROPOFOL;  Surgeon: Toledo, Benay Pike, MD;  Location: [facility];  Service: Gastroenterology;  Laterality: N/A;  ? CORONARY ARTERY BYPASS GRAFT N/A 11/29/2015  ? Procedure: CORONARY ARTERY BYPASS GRAFTING (CABG) x four, using left internal mammary artery and right leg saphenous vein harvested endoscopically;  Surgeon: Gaye Pollack, MD;  Location: [place] OR;  Service: Open Heart Surgery;  Laterality: N/A;  ? TEE WITHOUT CARDIOVERSION N/A 11/29/2015  ? Procedure: TRANSESOPHAGEAL ECHOCARDIOGRAM (TEE);  Surgeon: Gaye Pollack, MD;  Location: [place];  Service: Open Heart Surgery;  Laterality: N/A;  ? TURP VAPORIZATION    ? ?Family History  ?Problem Relation Age of Onset  ? Hypertension Mother   ? Healthy Father   ? ?Social History  ? ?Socioeconomic History  ? Marital status: Married  ?  Spouse name: BRENDA  ? Number of children: 2  ? Years of education: Not on file  ? Highest education level: Not on file  ?Occupational History  ? Not on file  ?Tobacco Use  ? Smoking status: Never  ? Smokeless tobacco: Never  ?Vaping Use  ? Vaping Use: Never used  ?Substance and Sexual Activity  ? Alcohol use: No  ? Drug use: No  ? Sexual activity: Not Currently  ?Other Topics Concern  ? Not on file  ?Social History Narrative  ? Married. Lives with his wife.  ? 2 children, 2 grandchildren.  ? Retired. Worked as a Furniture conservator/restorer.  ? Enjoys fishing.   ? 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  ? ?Social Determinants of Health  ? ?Financial Resource Strain: Low Risk   ? Difficulty of Paying Living Expenses: Not hard at all  ?Food Insecurity: No Food Insecurity  ? Worried About Charity fundraiser in the Last Year: Never true  ? Ran Out of Food in the Last Year: Never true  ?Transportation Needs: No Transportation Needs  ? Lack of Transportation (Medical): No  ? Lack of  Transportation (Non-Medical): No  ?Physical Activity: Sufficiently Active  ? Days of Exercise per Week: 5 days  ? Minutes of Exercise per Session: 30 min  ?Stress: No Stress Concern Present  ? Feeling of Stress : Not at all  ?Social Connections: Socially Integrated  ? Frequency of Communication with Friends and Family: More than three times a week  ? Frequency of Social Gatherings with Friends and Family: More than three times a week  ? Attends Religious Services: More than 4 times per year  ? Active Member of Clubs or Organizations: Yes  ? Attends Archivist Meetings: More than 4 times per year  ? Marital Status: Married  ? ? ?Tobacco Counseling ?Counseling given: Not Answered ? ? ?Clinical Intake: ? ?Pre-visit preparation completed: Yes ? ?Pain : No/denies pain ? ?  ? ?BMI - recorded: 27.12 ?  Nutritional Status: BMI 25 -29 Overweight ?Nutritional Risks: None ?Diabetes: No ? ?How often do you need to have someone help you when you read instructions, pamphlets, or other written materials from your doctor or pharmacy?: 1 - Never ? ?Diabetic?no ? ?Interpreter Needed?: No ? ?Information entered by :: mj Takerra Lupinacci,lpn ? ? ?Activities of Daily Living ? ?  05/22/2021  ?  9:46 AM  ?In your present state of health, do you have any difficulty performing the following activities:  ?Hearing? 0  ?Vision? 0  ?Difficulty concentrating or making decisions? 0  ?Walking or climbing stairs? 0  ?Dressing or bathing? 0  ?Doing errands, shopping? 0  ?Preparing Food and eating ? N  ?Using the Toilet? N  ?In the past six months, have you accidently leaked urine? N  ?Do you have problems with loss of bowel control? N  ?Managing your Medications? N  ?Managing your Finances? N  ?Housekeeping or managing your Housekeeping? N  ? ? ?Patient Care Team: ?Pleas Koch, NP as PCP - General (Internal Medicine) ?Jerline Pain, MD as PCP - Cardiology (Cardiology) ? ?Indicate any recent Medical Services you may have received from other than  [facility] providers in the past year (date may be approximate). ? ?   ?Assessment:  ? This is a routine wellness examination for [place]. ? ?Hearing/Vision screen ?Hearing Screening - Comments:: No hearing issues.

## 2021-05-22 NOTE — Patient Instructions (Signed)
[name] , ?Thank you for taking time to come for your Medicare Wellness Visit. I appreciate your ongoing commitment to your health goals. Please review the following plan we discussed and let me know if I can assist you in the future.  ? ?Screening recommendations/referrals: ?Colonoscopy: Done 02/14/2019 Repeat no longer required. ? ?Recommended yearly ophthalmology/optometry visit for glaucoma screening and checkup ?Recommended yearly dental visit for hygiene and checkup ? ?Vaccinations: ?Influenza vaccine: Done 11/29/2020 Repeat annually ? ?Pneumococcal vaccine: Done 10/29/2015 and 02/05/2017 ?Tdap vaccine: Due Repeat in 10 years ? ?Shingles vaccine: Done 03/28/2021   ?Covid-19: Done 04/19/2019, 05/10/2019, 01/05/2020, 08/03/2020 and 12/07/2020. ? ?Advanced directives: Advance directive discussed with you today. Even though you declined this today, please call our office should you change your mind, and we can give you the proper paperwork for you to fill out. ? ? ?Conditions/risks identified: Aim for 30 minutes of exercise or brisk walking, 6-8 glasses of water, and 5 servings of fruits and vegetables each day. ? ? ?Next appointment: Follow up in one year for your annual wellness visit. 2024. ? ?Preventive Care 82 Years and Older, Male ? ?Preventive care refers to lifestyle choices and visits with your health care provider that can promote health and wellness. ?What does preventive care include? ?A yearly physical exam. This is also called an annual well check. ?Dental exams once or twice a year. ?Routine eye exams. Ask your health care provider how often you should have your eyes checked. ?Personal lifestyle choices, including: ?Daily care of your teeth and gums. ?Regular physical activity. ?Eating a healthy diet. ?Avoiding tobacco and drug use. ?Limiting alcohol use. ?Practicing safe sex. ?Taking low doses of aspirin every day. ?Taking vitamin and mineral supplements as recommended by your health care  provider. ?What happens during an annual well check? ?The services and screenings done by your health care provider during your annual well check will depend on your age, overall health, lifestyle risk factors, and family history of disease. ?Counseling  ?Your health care provider may ask you questions about your: ?Alcohol use. ?Tobacco use. ?Drug use. ?Emotional well-being. ?Home and relationship well-being. ?Sexual activity. ?Eating habits. ?History of falls. ?Memory and ability to understand (cognition). ?Work and work Statistician. ?Screening  ?You may have the following tests or measurements: ?Height, weight, and BMI. ?Blood pressure. ?Lipid and cholesterol levels. These may be checked every 5 years, or more frequently if you are over 49 years old. ?Skin check. ?Lung cancer screening. You may have this screening every year starting at age 73 if you have a 30-pack-year history of smoking and currently smoke or have quit within the past 15 years. ?Fecal occult blood test (FOBT) of the stool. You may have this test every year starting at age 39. ?Flexible sigmoidoscopy or colonoscopy. You may have a sigmoidoscopy every 5 years or a colonoscopy every 10 years starting at age 7. ?Prostate cancer screening. Recommendations will vary depending on your family history and other risks. ?Hepatitis C blood test. ?Hepatitis B blood test. ?Sexually transmitted disease (STD) testing. ?Diabetes screening. This is done by checking your blood sugar (glucose) after you have not eaten for a while (fasting). You may have this done every 1-3 years. ?Abdominal aortic aneurysm (AAA) screening. You may need this if you are a current or former smoker. ?Osteoporosis. You may be screened starting at age 35 if you are at high risk. ?Talk with your health care provider about your test results, treatment options, and if necessary, the need for  more tests. ?Vaccines  ?Your health care provider may recommend certain vaccines, such  as: ?Influenza vaccine. This is recommended every year. ?Tetanus, diphtheria, and acellular pertussis (Tdap, Td) vaccine. You may need a Td booster every 10 years. ?Zoster vaccine. You may need this after age 24. ?Pneumococcal 13-valent conjugate (PCV13) vaccine. One dose is recommended after age 67. ?Pneumococcal polysaccharide (PPSV23) vaccine. One dose is recommended after age 65. ?Talk to your health care provider about which screenings and vaccines you need and how often you need them. ?This information is not intended to replace advice given to you by your health care provider. Make sure you discuss any questions you have with your health care provider. ?Document Released: 03/09/2015 Document Revised: 10/31/2015 Document Reviewed: 12/12/2014 ?Elsevier Interactive Patient Education ? 2017 [place]. ? ?Fall Prevention in the Home ?Falls can cause injuries. They can happen to people of all ages. There are many things you can do to make your home safe and to help prevent falls. ?What can I do on the outside of my home? ?Regularly fix the edges of walkways and driveways and fix any cracks. ?Remove anything that might make you trip as you walk through a door, such as a raised step or threshold. ?Trim any bushes or trees on the path to your home. ?Use bright outdoor lighting. ?Clear any walking paths of anything that might make someone trip, such as rocks or tools. ?Regularly check to see if handrails are loose or broken. Make sure that both sides of any steps have handrails. ?Any raised decks and porches should have guardrails on the edges. ?Have any leaves, snow, or ice cleared regularly. ?Use sand or salt on walking paths during winter. ?Clean up any spills in your garage right away. This includes oil or grease spills. ?What can I do in the bathroom? ?Use night lights. ?Install grab bars by the toilet and in the tub and shower. Do not use towel bars as grab bars. ?Use non-skid mats or decals in the tub or  shower. ?If you need to sit down in the shower, use a plastic, non-slip stool. ?Keep the floor dry. Clean up any water that spills on the floor as soon as it happens. ?Remove soap buildup in the tub or shower regularly. ?Attach bath mats securely with double-sided non-slip rug tape. ?Do not have throw rugs and other things on the floor that can make you trip. ?What can I do in the bedroom? ?Use night lights. ?Make sure that you have a light by your bed that is easy to reach. ?Do not use any sheets or blankets that are too big for your bed. They should not hang down onto the floor. ?Have a firm chair that has side arms. You can use this for support while you get dressed. ?Do not have throw rugs and other things on the floor that can make you trip. ?What can I do in the kitchen? ?Clean up any spills right away. ?Avoid walking on wet floors. ?Keep items that you use a lot in easy-to-reach places. ?If you need to reach something above you, use a strong step stool that has a grab bar. ?Keep electrical cords out of the way. ?Do not use floor polish or wax that makes floors slippery. If you must use wax, use non-skid floor wax. ?Do not have throw rugs and other things on the floor that can make you trip. ?What can I do with my stairs? ?Do not leave any items on the stairs. ?Make  sure that there are handrails on both sides of the stairs and use them. Fix handrails that are broken or loose. Make sure that handrails are as long as the stairways. ?Check any carpeting to make sure that it is firmly attached to the stairs. Fix any carpet that is loose or worn. ?Avoid having throw rugs at the top or bottom of the stairs. If you do have throw rugs, attach them to the floor with carpet tape. ?Make sure that you have a light switch at the top of the stairs and the bottom of the stairs. If you do not have them, ask someone to add them for you. ?What else can I do to help prevent falls? ?Wear shoes that: ?Do not have high heels. ?Have  rubber bottoms. ?Are comfortable and fit you well. ?Are closed at the toe. Do not wear sandals. ?If you use a stepladder: ?Make sure that it is fully opened. Do not climb a closed stepladder. ?Make sure that both sides of the st

## 2021-05-29 ENCOUNTER — Ambulatory Visit (INDEPENDENT_AMBULATORY_CARE_PROVIDER_SITE_OTHER): Payer: Medicare HMO | Admitting: Primary Care

## 2021-05-29 ENCOUNTER — Encounter: Payer: Self-pay | Admitting: Primary Care

## 2021-05-29 VITALS — BP 114/80 | HR 79 | Ht 72.5 in | Wt 219.8 lb

## 2021-05-29 DIAGNOSIS — I420 Dilated cardiomyopathy: Secondary | ICD-10-CM

## 2021-05-29 DIAGNOSIS — E7800 Pure hypercholesterolemia, unspecified: Secondary | ICD-10-CM | POA: Diagnosis not present

## 2021-05-29 DIAGNOSIS — R7303 Prediabetes: Secondary | ICD-10-CM | POA: Diagnosis not present

## 2021-05-29 DIAGNOSIS — Z8546 Personal history of malignant neoplasm of prostate: Secondary | ICD-10-CM

## 2021-05-29 DIAGNOSIS — I5022 Chronic systolic (congestive) heart failure: Secondary | ICD-10-CM | POA: Diagnosis not present

## 2021-05-29 DIAGNOSIS — Z0000 Encounter for general adult medical examination without abnormal findings: Secondary | ICD-10-CM | POA: Diagnosis not present

## 2021-05-29 DIAGNOSIS — C61 Malignant neoplasm of prostate: Secondary | ICD-10-CM

## 2021-05-29 DIAGNOSIS — E785 Hyperlipidemia, unspecified: Secondary | ICD-10-CM

## 2021-05-29 DIAGNOSIS — N320 Bladder-neck obstruction: Secondary | ICD-10-CM

## 2021-05-29 LAB — CBC
HCT: 37.7 % — ABNORMAL LOW (ref 39.0–52.0)
Hemoglobin: 12.6 g/dL — ABNORMAL LOW (ref 13.0–17.0)
MCHC: 33.5 g/dL (ref 30.0–36.0)
MCV: 95.1 fl (ref 78.0–100.0)
Platelets: 144 10*3/uL — ABNORMAL LOW (ref 150.0–400.0)
RBC: 3.97 Mil/uL — ABNORMAL LOW (ref 4.22–5.81)
RDW: 13.4 % (ref 11.5–15.5)
WBC: 2.2 10*3/uL — ABNORMAL LOW (ref 4.0–10.5)

## 2021-05-29 LAB — LIPID PANEL
Cholesterol: 180 mg/dL (ref 0–200)
HDL: 57.1 mg/dL (ref >39.00)
LDL Cholesterol: 107 mg/dL — ABNORMAL HIGH (ref 0–99)
NonHDL: 122.88
Total CHOL/HDL Ratio: 3
Triglycerides: 77 mg/dL (ref 0.0–149.0)
VLDL: 15.4 mg/dL (ref 0.0–40.0)

## 2021-05-29 LAB — COMPREHENSIVE METABOLIC PANEL
ALT: 12 U/L (ref 0–53)
AST: 21 U/L (ref 0–37)
Albumin: 4.5 g/dL (ref 3.5–5.2)
Alkaline Phosphatase: 52 U/L (ref 39–117)
BUN: 13 mg/dL (ref 6–23)
CO2: 30 mEq/L (ref 19–32)
Calcium: 10.2 mg/dL (ref 8.4–10.5)
Chloride: 105 mEq/L (ref 96–112)
Creatinine, Ser: 1.24 mg/dL (ref 0.40–1.50)
GFR: 55.93 mL/min — ABNORMAL LOW (ref >60.00)
Glucose, Bld: 84 mg/dL (ref 70–99)
Potassium: 4.7 mEq/L (ref 3.5–5.1)
Sodium: 141 mEq/L (ref 135–145)
Total Bilirubin: 0.9 mg/dL (ref 0.2–1.2)
Total Protein: 7.4 g/dL (ref 6.0–8.3)

## 2021-05-29 LAB — HEMOGLOBIN A1C: Hgb A1c MFr Bld: 6.5 % (ref 4.6–6.5)

## 2021-05-29 LAB — PSA, MEDICARE: PSA: 6.11 ng/ml — ABNORMAL HIGH (ref 0.10–4.00)

## 2021-05-29 MED ORDER — FINASTERIDE 5 MG PO TABS: ORAL_TABLET | ORAL | 3 refills | Status: DC

## 2021-05-29 MED ORDER — ROSUVASTATIN CALCIUM 40 MG PO TABS: 40.0000 mg | ORAL_TABLET | Freq: Every day | ORAL | 3 refills | Status: DC

## 2021-05-29 MED ORDER — LOSARTAN POTASSIUM 25 MG PO TABS: ORAL_TABLET | ORAL | 3 refills | Status: DC

## 2021-05-29 NOTE — Assessment & Plan Note (Signed)
Encouraged a healthy diet and exercise.  ? ?Repeat A1C pending. ?

## 2021-05-29 NOTE — Patient Instructions (Addendum)
Stop by the lab prior to leaving today. I will notify you of your results once received.  ? ?It was a pleasure to see you today! ? ?Preventive Care 53 Years and Older, Male ?Preventive care refers to lifestyle choices and visits with your health care provider that can promote health and wellness. Preventive care visits are also called wellness exams. ?What can I expect for my preventive care visit? ?Counseling ?During your preventive care visit, your health care provider may ask about your: ?Medical history, including: ?Past medical problems. ?Family medical history. ?History of falls. ?Current health, including: ?Emotional well-being. ?Home life and relationship well-being. ?Sexual activity. ?Memory and ability to understand (cognition). ?Lifestyle, including: ?Alcohol, nicotine or tobacco, and drug use. ?Access to firearms. ?Diet, exercise, and sleep habits. ?Work and work Statistician. ?Sunscreen use. ?Safety issues such as seatbelt and bike helmet use. ?Physical exam ?Your health care provider will check your: ?Height and weight. These may be used to calculate your BMI (body mass index). BMI is a measurement that tells if you are at a healthy weight. ?Waist circumference. This measures the distance around your waistline. This measurement also tells if you are at a healthy weight and may help predict your risk of certain diseases, such as type 2 diabetes and high blood pressure. ?Heart rate and blood pressure. ?Body temperature. ?Skin for abnormal spots. ?What immunizations do I need? ?Vaccines are usually given at various ages, according to a schedule. Your health care provider will recommend vaccines for you based on your age, medical history, and lifestyle or other factors, such as travel or where you work. ?What tests do I need? ?Screening ?Your health care provider may recommend screening tests for certain conditions. This may include: ?Lipid and cholesterol levels. ?Diabetes screening. This is done by  checking your blood sugar (glucose) after you have not eaten for a while (fasting). ?Hepatitis C test. ?Hepatitis B test. ?HIV (human immunodeficiency virus) test. ?STI (sexually transmitted infection) testing, if you are at risk. ?Lung cancer screening. ?Colorectal cancer screening. ?Prostate cancer screening. ?Abdominal aortic aneurysm (AAA) screening. You may need this if you are a current or former smoker. ?Talk with your health care provider about your test results, treatment options, and if necessary, the need for more tests. ?Follow these instructions at home: ?Eating and drinking ? ?Eat a diet that includes fresh fruits and vegetables, whole grains, lean protein, and low-fat dairy products. Limit your intake of foods with high amounts of sugar, saturated fats, and salt. ?Take vitamin and mineral supplements as recommended by your health care provider. ?Do not drink alcohol if your health care provider tells you not to drink. ?If you drink alcohol: ?Limit how much you have to 0-2 drinks a day. ?Know how much alcohol is in your drink. In the [place] one drink equals one 12 oz bottle of beer (355 mL), one 5 oz glass of wine (148 mL), or one 1? oz glass of hard liquor (44 mL). ?Lifestyle ?Brush your teeth every morning and night with fluoride toothpaste. Floss one time each day. ?Exercise for at least 30 minutes 5 or more days each week. ?Do not use any products that contain nicotine or tobacco. These products include cigarettes, chewing tobacco, and vaping devices, such as e-cigarettes. If you need help quitting, ask your health care provider. ?Do not use drugs. ?If you are sexually active, practice safe sex. Use a condom or other form of protection to prevent STIs. ?Take aspirin only as told by your health care provider.  Make sure that you understand how much to take and what form to take. Work with your health care provider to find out whether it is safe and beneficial for you to take aspirin daily. ?Ask your  health care provider if you need to take a cholesterol-lowering medicine (statin). ?Find healthy ways to manage stress, such as: ?Meditation, yoga, or listening to music. ?Journaling. ?Talking to a trusted person. ?Spending time with friends and family. ?Safety ?Always wear your seat belt while driving or riding in a vehicle. ?Do not drive: ?If you have been drinking alcohol. Do not ride with someone who has been drinking. ?When you are tired or distracted. ?While texting. ?If you have been using any mind-altering substances or drugs. ?Wear a helmet and other protective equipment during sports activities. ?If you have firearms in your house, make sure you follow all gun safety procedures. ?Minimize exposure to UV radiation to reduce your risk of skin cancer. ?What's next? ?Visit your health care provider once a year for an annual wellness visit. ?Ask your health care provider how often you should have your eyes and teeth checked. ?Stay up to date on all vaccines. ?This information is not intended to replace advice given to you by your health care provider. Make sure you discuss any questions you have with your health care provider. ?Document Revised: 08/08/2020 Document Reviewed: 08/08/2020 ?Elsevier Patient Education ? [place]. ? ?

## 2021-05-29 NOTE — Assessment & Plan Note (Signed)
Controlled. ? ?Continue finasteride 5 mg daily. ?PSA pending. ?

## 2021-05-29 NOTE — Assessment & Plan Note (Signed)
Continue Zetia 10 mg and rosuvastatin 40 mg. ? ?Repeat lipid panel pending. ?

## 2021-05-29 NOTE — Assessment & Plan Note (Signed)
Asymptomatic. ?Repeat PSA pending. ? ?Not following with Urology  ?

## 2021-05-29 NOTE — Assessment & Plan Note (Addendum)
Stable. ? ?Following with cardiology, office notes from October 2022 reviewed.  ? ?We contacted patient's pharmacy who verified that he has been receiving carvedilol 3.125 mg with instructions to take 1 tab twice daily.  ? ?It appears that he was previously prescribed 6.25 mg with instructions to take 1-1/2 tablets twice daily, this seems to be where the confusion lies. ? ?We verified the dose of carvedilol to be taken which is 3.125 mg twice daily, he verbalized understanding. ? ?Continue carvedilol 3.125 mg BID, spironolactone 12.5 mg daily, losartan 25 mg daily, aspirin 81 mg daily. ? ?CMP pending. ?

## 2021-05-29 NOTE — Assessment & Plan Note (Signed)
Immunizations UTD. ?PSA due and pending.  ?Colonoscopy N/A given age.  ? ?Discussed the importance of a healthy diet and regular exercise in order for weight loss, and to reduce the risk of further co-morbidity. ? ?Exam today stable. ?Labs pending. ?

## 2021-05-29 NOTE — Progress Notes (Signed)
? ?Subjective:  ? ? Patient ID: Shane Kim, male    DOB: [birth year], 76 y.o.   MRN: [id number] ? ?HPI ? ?[name] is a very pleasant 76 y.o. male who presents today for complete physical and follow up of chronic conditions. ? ?He has multiple questions today about his carvedilol dose.  He endorses today that he was told by the pharmacy to cut his carvedilol tablet in half and take 1/2 tablet twice daily.  He's had a difficult time cutting his pills in half as they are very small and crumble. He is prescribed carvedilol 3.125 mg twice daily per his cardiologist. ? ?Immunizations: ?-Influenza: Completed this season  ?-Covid-19: Completed 3 vaccines  ?-Shingles: Completed first Shingrix dose ?-Pneumonia: Prevnar 13 in 2018, Pneumovax 23 and 2018 ? ?Diet: Fair diet.  ?Exercise: No regular exercise. ? ?Eye exam: Completed several years ago ?Dental exam: Completed one year ago ? ?Colonoscopy: Completed in 2020, no further imaging needed. ?PSA: Due ? ? ?BP Readings from Last 3 Encounters:  ?05/29/21 114/80  ?11/27/20 102/72  ?04/18/20 90/60  ? ? ? ? ? ?Review of Systems  ?Constitutional:  Negative for unexpected weight change.  ?HENT:  Negative for rhinorrhea.   ?Eyes:  Negative for visual disturbance.  ?Respiratory:  Negative for cough and shortness of breath.   ?Cardiovascular:  Negative for chest pain.  ?Gastrointestinal:  Negative for constipation and diarrhea.  ?Genitourinary:  Negative for difficulty urinating.  ?Musculoskeletal:  Negative for arthralgias and myalgias.  ?Skin:  Negative for rash.  ?Allergic/Immunologic: Negative for environmental allergies.  ?Neurological:  Negative for dizziness and headaches.  ?Psychiatric/Behavioral:  The patient is not nervous/anxious.   ? ?   ? ? ?Past Medical History:  ?Diagnosis Date  ? Acute systolic CHF (congestive heart failure) ([place])   ? a. echo 11/21/15: EF <20%, mildly dilated LV, severe diffuse global HK, GR3DD, mod MR, LA mildly dilated, RV sys fxn mildly  reduced, PASP 52 mmHg  ? Anemia   ? CAD (coronary artery disease)   ? a. cardiac cath 11/22/15: dLM 40%, pLAD 80-90%, mLAD sequential 40%, D2 90%, mLCx 90% at takeoff of OM1, RCA appeared occluded proximally, LVEF 20%, PCWP 29   ? Chickenpox   ? Chronic diastolic CHF (congestive heart failure) ([name])   ? HLD (hyperlipidemia)   ? Hypertension   ? Pre-diabetes   ? Prostate cancer ([place])   ? ? ?Social History  ? ?Socioeconomic History  ? Marital status: Married  ?  Spouse name: BRENDA  ? Number of children: 2  ? Years of education: Not on file  ? Highest education level: Not on file  ?Occupational History  ? Not on file  ?Tobacco Use  ? Smoking status: Never  ? Smokeless tobacco: Never  ?Vaping Use  ? Vaping Use: Never used  ?Substance and Sexual Activity  ? Alcohol use: No  ? Drug use: No  ? Sexual activity: Not Currently  ?Other Topics Concern  ? Not on file  ?Social History Narrative  ? Married. Lives with his wife.  ? 2 children, 2 grandchildren.  ? Retired. Worked as a Furniture conservator/restorer.  ? Enjoys fishing.   ? 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  ? ?Social Determinants of Health  ? ?Financial Resource Strain: Low Risk   ? Difficulty of Paying Living Expenses: Not hard at all  ?Food Insecurity: No Food Insecurity  ? Worried About Charity fundraiser in the Last Year: Never true  ? Ran Out  of Food in the Last Year: Never true  ?Transportation Needs: No Transportation Needs  ? Lack of Transportation (Medical): No  ? Lack of Transportation (Non-Medical): No  ?Physical Activity: Sufficiently Active  ? Days of Exercise per Week: 5 days  ? Minutes of Exercise per Session: 30 min  ?Stress: No Stress Concern Present  ? Feeling of Stress : Not at all  ?Social Connections: Socially Integrated  ? Frequency of Communication with Friends and Family: More than three times a week  ? Frequency of Social Gatherings with Friends and Family: More than three times a week  ? Attends Religious Services: More than 4 times per year  ?  Active Member of Clubs or Organizations: Yes  ? Attends Archivist Meetings: More than 4 times per year  ? Marital Status: Married  ?Intimate Partner Violence: Not At Risk  ? Fear of Current or Ex-Partner: No  ? Emotionally Abused: No  ? Physically Abused: No  ? Sexually Abused: No  ? ? ?Past Surgical History:  ?Procedure Laterality Date  ? CARDIAC CATHETERIZATION N/A 11/22/2015  ? Procedure: Right/Left Heart Cath and Coronary Angiography;  Surgeon: Minna Merritts, MD;  Location: [place] [facility];  Service: Cardiovascular;  Laterality: N/A;  ? CARDIAC CATHETERIZATION Left 11/29/2015  ? Procedure: LEFT FEMORAL ARTERIAL LINE INSERTION;  Surgeon: Gaye Pollack, MD;  Location: [facility];  Service: Open Heart Surgery;  Laterality: Left;  ? COLONOSCOPY WITH PROPOFOL N/A 02/14/2019  ? Procedure: COLONOSCOPY WITH PROPOFOL;  Surgeon: Toledo, Benay Pike, MD;  Location: [facility];  Service: Gastroenterology;  Laterality: N/A;  ? CORONARY ARTERY BYPASS GRAFT N/A 11/29/2015  ? Procedure: CORONARY ARTERY BYPASS GRAFTING (CABG) x four, using left internal mammary artery and right leg saphenous vein harvested endoscopically;  Surgeon: Gaye Pollack, MD;  Location: [facility];  Service: Open Heart Surgery;  Laterality: N/A;  ? TEE WITHOUT CARDIOVERSION N/A 11/29/2015  ? Procedure: TRANSESOPHAGEAL ECHOCARDIOGRAM (TEE);  Surgeon: Gaye Pollack, MD;  Location: [facility];  Service: Open Heart Surgery;  Laterality: N/A;  ? TURP VAPORIZATION    ? ? ?Family History  ?Problem Relation Age of Onset  ? Hypertension Mother   ? Healthy Father   ? ? ?No Known Allergies ? ?Current Outpatient Medications on File Prior to Visit  ?Medication Sig Dispense Refill  ? aspirin (ASPIRIN LOW DOSE) 81 MG EC tablet TAKE 1 TABLET(81 MG) BY MOUTH DAILY. SWALLOW WHOLE 90 tablet 2  ? carvedilol (COREG) 3.125 MG tablet TAKE 1 TABLET(3.125 MG) BY MOUTH TWICE DAILY WITH A MEAL 180 tablet 3  ? ezetimibe (ZETIA) 10 MG tablet Take 1 tablet (10 mg total) by mouth  daily. 90 tablet 0  ? spironolactone (ALDACTONE) 25 MG tablet Take 0.5 tablets (12.5 mg total) by mouth daily. 45 tablet 3  ? ?No current facility-administered medications on file prior to visit.  ? ? ?BP 114/80   Pulse 79   Ht 6' 0.5" (1.842 m)   Wt 219 lb 12.8 oz (99.7 kg)   SpO2 100%   BMI 29.40 kg/m?  ?Objective:  ? Physical Exam ?HENT:  ?   Right Ear: Tympanic membrane and ear canal normal.  ?   Left Ear: Tympanic membrane and ear canal normal.  ?   Nose: Nose normal.  ?   Right Sinus: No maxillary sinus tenderness or frontal sinus tenderness.  ?   Left Sinus: No maxillary sinus tenderness or frontal sinus tenderness.  ?Eyes:  ?  Conjunctiva/sclera: Conjunctivae normal.  ?Neck:  ?   Thyroid: No thyromegaly.  ?   Vascular: No carotid bruit.  ?Cardiovascular:  ?   Rate and Rhythm: Normal rate and regular rhythm.  ?   Heart sounds: Normal heart sounds.  ?Pulmonary:  ?   Effort: Pulmonary effort is normal.  ?   Breath sounds: Normal breath sounds. No wheezing or rales.  ?Abdominal:  ?   General: Bowel sounds are normal.  ?   Palpations: Abdomen is soft.  ?   Tenderness: There is no abdominal tenderness.  ?Musculoskeletal:     ?   General: Normal range of motion.  ?   Cervical back: Neck supple.  ?Skin: ?   General: Skin is warm and dry.  ?Neurological:  ?   Mental Status: He is alert and oriented to person, place, and time.  ?   Cranial Nerves: No cranial nerve deficit.  ?   Deep Tendon Reflexes: Reflexes are normal and symmetric.  ?Psychiatric:     ?   Mood and Affect: Mood normal.  ? ? ? ? ? ?   ?Assessment & Plan:  ? ? ? ? ?This visit occurred during the SARS-CoV-2 public health emergency.  Safety protocols were in place, including screening questions prior to the visit, additional usage of staff PPE, and extensive cleaning of exam room while observing appropriate contact time as indicated for disinfecting solutions.  ?

## 2021-05-29 NOTE — Assessment & Plan Note (Signed)
Stable. ? ?Following with cardiology, office notes from October 2022 reviewed.  ? ?Continue carvedilol 3.125 mg BID, spironolactone 12.5 mg daily, losartan 25 mg daily, aspirin 81 mg daily. ? ?CMP pending. ?

## 2021-05-30 ENCOUNTER — Telehealth: Payer: Self-pay

## 2021-05-30 NOTE — Telephone Encounter (Signed)
Pharmacist called in to verify dose of pt's spironolactone, as pt was very verbal with them when he came to get his medication. Pharmacist thinks that pt got his meds mixed up when there was a discussion about his carvidolol during his OV.  ?

## 2021-06-03 ENCOUNTER — Other Ambulatory Visit: Payer: Self-pay | Admitting: Primary Care

## 2021-06-03 DIAGNOSIS — Z8546 Personal history of malignant neoplasm of prostate: Secondary | ICD-10-CM

## 2021-06-03 DIAGNOSIS — R972 Elevated prostate specific antigen [PSA]: Secondary | ICD-10-CM

## 2021-06-15 ENCOUNTER — Other Ambulatory Visit: Payer: Self-pay | Admitting: Cardiology

## 2021-07-08 DIAGNOSIS — C61 Malignant neoplasm of prostate: Secondary | ICD-10-CM | POA: Diagnosis not present

## 2021-07-10 ENCOUNTER — Other Ambulatory Visit (HOSPITAL_COMMUNITY): Payer: Self-pay | Admitting: Urology

## 2021-07-10 DIAGNOSIS — C61 Malignant neoplasm of prostate: Secondary | ICD-10-CM

## 2021-08-06 ENCOUNTER — Encounter (HOSPITAL_COMMUNITY)
Admission: RE | Admit: 2021-08-06 | Discharge: 2021-08-06 | Disposition: A | Discharge status: Home / Self Care | Payer: Medicare HMO | Source: Ambulatory Visit | Attending: Urology | Admitting: Urology

## 2021-08-06 DIAGNOSIS — Z951 Presence of aortocoronary bypass graft: Secondary | ICD-10-CM | POA: Diagnosis not present

## 2021-08-06 DIAGNOSIS — C61 Malignant neoplasm of prostate: Secondary | ICD-10-CM | POA: Diagnosis not present

## 2021-08-06 DIAGNOSIS — R911 Solitary pulmonary nodule: Secondary | ICD-10-CM | POA: Diagnosis not present

## 2021-08-06 MED ORDER — PIFLIFOLASTAT F 18 (PYLARIFY) INJECTION
9.0000 | Freq: Once | INTRAVENOUS | Status: AC
Start: 2021-08-06 — End: 2021-08-06
  Administered 2021-08-06: 7.6 via INTRAVENOUS

## 2021-09-03 DIAGNOSIS — C61 Malignant neoplasm of prostate: Secondary | ICD-10-CM | POA: Diagnosis not present

## 2021-09-16 ENCOUNTER — Other Ambulatory Visit: Payer: Self-pay | Admitting: Primary Care

## 2021-09-16 DIAGNOSIS — R972 Elevated prostate specific antigen [PSA]: Secondary | ICD-10-CM

## 2021-09-16 DIAGNOSIS — Z8546 Personal history of malignant neoplasm of prostate: Secondary | ICD-10-CM

## 2021-09-16 DIAGNOSIS — C61 Malignant neoplasm of prostate: Secondary | ICD-10-CM | POA: Diagnosis not present

## 2021-09-22 ENCOUNTER — Other Ambulatory Visit: Payer: Self-pay | Admitting: Internal Medicine

## 2021-09-23 ENCOUNTER — Telehealth: Payer: Self-pay | Admitting: Primary Care

## 2021-09-23 NOTE — Telephone Encounter (Addendum)
Patient came in and asked were the lab orders for her grandad sent in from Dr Jeffie Pollock from [facility]. I left a copy of the order in [employer].

## 2021-09-24 NOTE — Telephone Encounter (Signed)
Placed in your folder for review. 

## 2021-09-24 NOTE — Telephone Encounter (Signed)
Patient's granddaughter Shane Kim (on [place]) notified as instructed by telephone and verbalized understanding.Shane Kim stated that patient is not due for the lab work for 3 months per instructions from Dr. Jeffie Pollock. Patient scheduled for lab appointment 12/02/21 at 11:00 am.

## 2021-09-24 NOTE — Telephone Encounter (Signed)
Left message on voicemail for Shane Kim to call the office back. Okay to schedule lab appointment per Allie Bossier NP when [place] calls the office back.

## 2021-09-24 NOTE — Telephone Encounter (Signed)
Please notify patient that I received Dr. Ralene Muskrat orders. He is welcome to schedule a lab appointment to have his PSA drawn as directed by Dr. Jeffie Pollock.  We will fax results to Dr. Jeffie Pollock once received.

## 2021-10-02 ENCOUNTER — Encounter: Payer: Self-pay | Admitting: Family Medicine

## 2021-10-02 ENCOUNTER — Ambulatory Visit (INDEPENDENT_AMBULATORY_CARE_PROVIDER_SITE_OTHER): Payer: Medicare HMO | Admitting: Family Medicine

## 2021-10-02 VITALS — BP 100/62 | HR 91 | Temp 98.3°F | Ht 72.5 in | Wt 213.0 lb

## 2021-10-02 DIAGNOSIS — N3000 Acute cystitis without hematuria: Secondary | ICD-10-CM | POA: Diagnosis not present

## 2021-10-02 DIAGNOSIS — R300 Dysuria: Secondary | ICD-10-CM

## 2021-10-02 DIAGNOSIS — Z8546 Personal history of malignant neoplasm of prostate: Secondary | ICD-10-CM

## 2021-10-02 LAB — POCT URINALYSIS DIP (CLINITEK)
Bilirubin, UA: NEGATIVE
Glucose, UA: NEGATIVE mg/dL
Ketones, POC UA: NEGATIVE mg/dL
Nitrite, UA: POSITIVE — AB
Spec Grav, UA: 1.02 (ref 1.010–1.025)
Urobilinogen, UA: 0.2 E.U./dL
pH, UA: 6 (ref 5.0–8.0)

## 2021-10-02 MED ORDER — CEPHALEXIN 500 MG PO CAPS: 500.0000 mg | ORAL_CAPSULE | Freq: Three times a day (TID) | ORAL | 0 refills | Status: DC

## 2021-10-02 NOTE — Assessment & Plan Note (Addendum)
In pt with h/o prostate cancer and bladder outlet obst Notes and past labs reviewed from urology  Pos ua today  Dysuria for 3 weeks but no fever or nausea or flank pain   tx with keflex pending culture result Fluids (per pt not limited for chf) ER precautions rev Handout given  inst to call if symptoms suddenly worsen

## 2021-10-02 NOTE — Progress Notes (Signed)
Subjective:    Patient ID: Shane Kim, male    DOB: Jun 24, 1945, 76 y.o.   MRN: [id number]  HPI 76 yo pt of NP [name] presents for urinary symptoms   Wt Readings from Last 3 Encounters:  10/02/21 213 lb (96.6 kg)  05/29/21 219 lb 12.8 oz (99.7 kg)  05/22/21 217 lb (98.4 kg)   28.49 kg/m  About 3 weeks  Burning to urinate  This causes brief hesitancy  Once flow gets going it is fine   No incontinence No discharge  No bladder discomfort   Some frequency - for past 3 weeks Some urgency- sometimes leaks before he gets there   No new sexual contacts  Not worried about STDS    No back pain  No abd pain  No fever No nausea   He has a history of prostate cancer  Reviewed urology note from 7/25- is observing this as psa rises (was tx years ago) and pt has elected against bx or aggressive treatment currently  He takes finasteride 5 mg daily for bladder outlet obst   Takes spironolactone for CHF   Lab Results  Component Value Date   CREATININE 1.24 05/29/2021   BUN 13 05/29/2021   NA 141 05/29/2021   K 4.7 05/29/2021   CL 105 05/29/2021   CO2 30 05/29/2021  GFR 55.9   Ua is positive for leuk and nitrates   Results for orders placed or performed in visit on 10/02/21  POCT URINALYSIS DIP (CLINITEK)  Result Value Ref Range   Color, UA yellow yellow   Clarity, UA cloudy (A) clear   Glucose, UA negative negative mg/dL   Bilirubin, UA negative negative   Ketones, POC UA negative negative mg/dL   Spec Grav, UA 1.020 1.010 - 1.025   Blood, UA small (A) negative   pH, UA 6.0 5.0 - 8.0   POC PROTEIN,UA trace negative, trace   Urobilinogen, UA 0.2 0.2 or 1.0 E.U./dL   Nitrite, UA Positive (A) Negative   Leukocytes, UA Moderate (2+) (A) Negative    Patient Active Problem List   Diagnosis Date Noted   Dysuria 10/02/2021   Acute cystitis 10/02/2021   Anemia 03/12/2018   Hx of adenomatous colonic polyps 03/12/2018   Leukopenia 03/12/2018   Preventative health  care 02/12/2017   Coronary artery disease 11/29/2015   Prediabetes 11/22/2015   Congestive heart failure (CHF) ([place]) 11/21/2015   History of prostate cancer 11/21/2015   HLD (hyperlipidemia) 11/21/2015   Congestive dilated cardiomyopathy ([place])    Bladder outlet obstruction 07/22/2013   ED (erectile dysfunction) of organic origin 06/18/2005   Past Medical History:  Diagnosis Date   Acute systolic CHF (congestive heart failure) ([place])    a. echo 11/21/15: EF <20%, mildly dilated LV, severe diffuse global HK, GR3DD, mod MR, LA mildly dilated, RV sys fxn mildly reduced, PASP 52 mmHg   Anemia    CAD (coronary artery disease)    a. cardiac cath 11/22/15: dLM 40%, pLAD 80-90%, mLAD sequential 40%, D2 90%, mLCx 90% at takeoff of OM1, RCA appeared occluded proximally, LVEF 20%, PCWP 29    Chickenpox    Chronic diastolic CHF (congestive heart failure) ([facility])    HLD (hyperlipidemia)    Hypertension    Pre-diabetes    Prostate cancer [facility])    Past Surgical History:  Procedure Laterality Date   CARDIAC CATHETERIZATION N/A 11/22/2015   Procedure: Right/Left Heart Cath and Coronary Angiography;  Surgeon: Minna Merritts, MD;  Location:  [facility];  Service: Cardiovascular;  Laterality: N/A;   CARDIAC CATHETERIZATION Left 11/29/2015   Procedure: LEFT FEMORAL ARTERIAL LINE INSERTION;  Surgeon: Gaye Pollack, MD;  Location: [facility];  Service: Open Heart Surgery;  Laterality: Left;   COLONOSCOPY WITH PROPOFOL N/A 02/14/2019   Procedure: COLONOSCOPY WITH PROPOFOL;  Surgeon: Toledo, Benay Pike, MD;  Location: [facility];  Service: Gastroenterology;  Laterality: N/A;   CORONARY ARTERY BYPASS GRAFT N/A 11/29/2015   Procedure: CORONARY ARTERY BYPASS GRAFTING (CABG) x four, using left internal mammary artery and right leg saphenous vein harvested endoscopically;  Surgeon: Gaye Pollack, MD;  Location: [facility] OR;  Service: Open Heart Surgery;  Laterality: N/A;   TEE WITHOUT CARDIOVERSION N/A 11/29/2015    Procedure: TRANSESOPHAGEAL ECHOCARDIOGRAM (TEE);  Surgeon: Gaye Pollack, MD;  Location: [facility];  Service: Open Heart Surgery;  Laterality: N/A;   TURP VAPORIZATION     Social History   Tobacco Use   Smoking status: Never   Smokeless tobacco: Never  Vaping Use   Vaping Use: Never used  Substance Use Topics   Alcohol use: No   Drug use: No   Family History  Problem Relation Age of Onset   Hypertension Mother    Healthy Father    No Known Allergies Current Outpatient Medications on File Prior to Visit  Medication Sig Dispense Refill   aspirin EC (ASPIRIN LOW DOSE) 81 MG tablet TAKE 1 TABLET(81 MG) BY MOUTH DAILY. SWALLOW WHOLE 90 tablet 0   carvedilol (COREG) 3.125 MG tablet TAKE 1 TABLET(3.125 MG) BY MOUTH TWICE DAILY WITH A MEAL 180 tablet 3   ezetimibe (ZETIA) 10 MG tablet TAKE 1 TABLET(10 MG) BY MOUTH DAILY 90 tablet 1   finasteride (PROSCAR) 5 MG tablet TAKE 1 TABLET BY MOUTH EVERY DAY for urine flow 90 tablet 3   losartan (COZAAR) 25 MG tablet TAKE 1 TABLET BY MOUTH EVERY DAY FOR BLOOD PRESSURE 90 tablet 3   rosuvastatin (CRESTOR) 40 MG tablet Take 1 tablet (40 mg total) by mouth daily. For cholesterol. 90 tablet 3   spironolactone (ALDACTONE) 25 MG tablet Take 0.5 tablets (12.5 mg total) by mouth daily. 45 tablet 3   No current facility-administered medications on file prior to visit.    Review of Systems  Constitutional:  Positive for fatigue. Negative for activity change, appetite change and fever.  HENT:  Negative for congestion and sore throat.   Eyes:  Negative for itching and visual disturbance.  Respiratory:  Negative for cough and shortness of breath.   Cardiovascular:  Negative for leg swelling.  Gastrointestinal:  Negative for abdominal distention, abdominal pain, constipation, diarrhea and nausea.  Endocrine: Negative for cold intolerance and polydipsia.  Genitourinary:  Positive for dysuria, frequency and urgency. Negative for difficulty urinating, flank pain,  hematuria, penile discharge and penile pain.  Musculoskeletal:  Negative for myalgias.  Skin:  Negative for rash.  Allergic/Immunologic: Negative for immunocompromised state.  Neurological:  Negative for dizziness and weakness.  Hematological:  Negative for adenopathy.       Objective:   Physical Exam Constitutional:      General: He is not in acute distress.    Appearance: Normal appearance. He is well-developed and normal weight. He is not ill-appearing or diaphoretic.  HENT:     Head: Normocephalic and atraumatic.  Eyes:     Conjunctiva/sclera: Conjunctivae normal.     Pupils: Pupils are equal, round, and reactive to light.  Cardiovascular:     Rate  and Rhythm: Normal rate and regular rhythm.     Heart sounds: Normal heart sounds.  Pulmonary:     Effort: Pulmonary effort is normal.     Breath sounds: Normal breath sounds.  Abdominal:     General: Bowel sounds are normal. There is no distension.     Palpations: Abdomen is soft.     Tenderness: There is no abdominal tenderness. There is no right CVA tenderness, left CVA tenderness, guarding or rebound.     Comments: No cva tenderness  No suprapubic tenderness or fullness    Musculoskeletal:     Cervical back: Normal range of motion and neck supple.  Lymphadenopathy:     Cervical: No cervical adenopathy.  Skin:    Findings: No erythema or rash.  Neurological:     Mental Status: He is alert.  Psychiatric:        Mood and Affect: Mood normal.           Assessment & Plan:   Problem List Items Addressed This Visit       Genitourinary   Acute cystitis    In pt with h/o prostate cancer and bladder outlet obst  Pos ua  Dysuria for 3 weeks   tx with keflex pending culture result Fluids (per pt not limited for chf) ER precautions rev Handout given  inst to call if symptoms suddenly worsen         Other   Dysuria   Relevant Orders   Urine Culture   History of prostate cancer - Primary   Other Visit  Diagnoses     Burning with urination       Relevant Orders   POCT URINALYSIS DIP (CLINITEK) (Completed)   Urine Culture

## 2021-10-02 NOTE — Patient Instructions (Addendum)
I think you have a uti  Drink lots of water  Take the generic keflex as directed   I will send urine sample for a culture-this will tell us what bacteria we are treating and then we can change therapy if necessary  We will call you about that   While waiting for this if symptoms worsen please let us know

## 2021-10-04 LAB — URINE CULTURE
MICRO NUMBER:: 13756492
SPECIMEN QUALITY:: ADEQUATE

## 2021-10-16 ENCOUNTER — Ambulatory Visit (INDEPENDENT_AMBULATORY_CARE_PROVIDER_SITE_OTHER): Payer: Medicare HMO | Admitting: Nurse Practitioner

## 2021-10-16 VITALS — BP 130/62 | HR 66 | Temp 96.5°F | Resp 12 | Ht 72.5 in | Wt 211.4 lb

## 2021-10-16 DIAGNOSIS — R3915 Urgency of urination: Secondary | ICD-10-CM | POA: Diagnosis not present

## 2021-10-16 DIAGNOSIS — R339 Retention of urine, unspecified: Secondary | ICD-10-CM | POA: Diagnosis not present

## 2021-10-16 DIAGNOSIS — N3001 Acute cystitis with hematuria: Secondary | ICD-10-CM

## 2021-10-16 DIAGNOSIS — R351 Nocturia: Secondary | ICD-10-CM | POA: Insufficient documentation

## 2021-10-16 DIAGNOSIS — Z8546 Personal history of malignant neoplasm of prostate: Secondary | ICD-10-CM | POA: Diagnosis not present

## 2021-10-16 HISTORY — DX: Retention of urine, unspecified: R33.9

## 2021-10-16 LAB — POC URINALSYSI DIPSTICK (AUTOMATED)
Bilirubin, UA: NEGATIVE
Glucose, UA: NEGATIVE
Ketones, UA: NEGATIVE
Nitrite, UA: POSITIVE
Protein, UA: POSITIVE — AB
Spec Grav, UA: 1.015 (ref 1.010–1.025)
Urobilinogen, UA: 0.2 E.U./dL
pH, UA: 5.5 (ref 5.0–8.0)

## 2021-10-16 MED ORDER — CEPHALEXIN 500 MG PO CAPS: 500.0000 mg | ORAL_CAPSULE | Freq: Two times a day (BID) | ORAL | 0 refills | Status: AC

## 2021-10-16 NOTE — Assessment & Plan Note (Signed)
Patient is having where he will dribble on himself on his way to the bathroom.  Has been referred to urology gave information to contact urology but will treat UTI currently

## 2021-10-16 NOTE — Assessment & Plan Note (Signed)
More so urgency with urinary hesitancy.  We will treat for cystitis does not improve consider adding on tamsulosin

## 2021-10-16 NOTE — Assessment & Plan Note (Signed)
Last PSA done approximately 4 months ago that was out of normal range.  Patient was referred to urology but has never followed up.  Did give patient information today to call and make an appoint with urology

## 2021-10-16 NOTE — Progress Notes (Signed)
Acute Office Visit  Subjective:     Patient ID: Shane Kim, male    DOB: 08/22/1945, 76 y.o.   MRN: [id number]  Chief Complaint  Patient presents with   burning with urination    Was seen on 10/02/21 for the same issues, got better but did not resolve. Having urine retention, has had hard time starting urine stream and when it does it drips at first and then flows. Frequent urination at times. No blood in the urine. No nausea, no fever.    HPI Patient is in today for Dysuria  Was seen in office on 10/02/2021 by Dr. Glori Bickers was dx with cysitis and treated with keflex. Culture did come back positive and was sensitive to the antibiotic used.   Has a hx of prostate cancer.  Per patient report they did freeze a part of it off many years ago but no longer followed by urology  States that he is having urinary urgency. States that it is not a new symptoms. Denies dysuria. States that he does have some urinary hesitancy. States that he will dribble  Review of Systems  Constitutional:  Negative for chills and fever.  Gastrointestinal:  Negative for abdominal pain, nausea and vomiting.  Genitourinary:  Positive for urgency. Negative for dysuria and hematuria.       Nocturia depending on the fluid intake.  States that he does have nocturia several times through out         Objective:    BP 130/62   Pulse 66   Temp (!) 96.5 F (35.8 C)   Resp 12   Ht 6' 0.5" (1.842 m)   Wt 211 lb 6 oz (95.9 kg)   SpO2 93%   BMI 28.27 kg/m    Physical Exam Vitals and nursing note reviewed.  Constitutional:      Appearance: Normal appearance.  Cardiovascular:     Rate and Rhythm: Normal rate and regular rhythm.     Heart sounds: Normal heart sounds.  Pulmonary:     Breath sounds: Normal breath sounds.  Abdominal:     General: Bowel sounds are normal. There is no distension.     Palpations: There is no mass.     Tenderness: There is no abdominal tenderness. There is no right CVA  tenderness or left CVA tenderness.     Hernia: No hernia is present.  Neurological:     Mental Status: He is alert.     Results for orders placed or performed in visit on 10/16/21  POCT Urinalysis Dipstick (Automated)  Result Value Ref Range   Color, UA orange    Clarity, UA cloudy    Glucose, UA Negative Negative   Bilirubin, UA negative    Ketones, UA negative    Spec Grav, UA 1.015 1.010 - 1.025   Blood, UA trace    pH, UA 5.5 5.0 - 8.0   Protein, UA Positive (A) Negative   Urobilinogen, UA 0.2 0.2 or 1.0 E.U./dL   Nitrite, UA positive    Leukocytes, UA Large (3+) (A) Negative        Assessment & Plan:   Problem List Items Addressed This Visit       Genitourinary   Acute cystitis    We will treat with Keflex again since patient responded.  If he has recurrent symptoms need to consider prostatitis.  Was going to treat with Bactrim but patient is on spironolactone and risk for hyperkalemia would be too great.  Can consider  fluoroquinolone if needed      Relevant Medications   cephALEXin (KEFLEX) 500 MG capsule   Urinary retention - Primary    More so urgency with urinary hesitancy.  We will treat for cystitis does not improve consider adding on tamsulosin      Relevant Orders   POCT Urinalysis Dipstick (Automated) (Completed)   Urine Culture     Other   History of prostate cancer    Last PSA done approximately 4 months ago that was out of normal range.  Patient was referred to urology but has never followed up.  Did give patient information today to call and make an appoint with urology      Nocturia    Likely related to increased prostate volume and urinary tract infection.  Treat with antibiotics first if no improvement consider adding tamsulosin      Urinary urgency    Patient is having where he will dribble on himself on his way to the bathroom.  Has been referred to urology gave information to contact urology but will treat UTI currently       Meds  ordered this encounter  Medications   cephALEXin (KEFLEX) 500 MG capsule    Sig: Take 1 capsule (500 mg total) by mouth 2 (two) times daily for 7 days.    Dispense:  14 capsule    Refill:  0    Order Specific Question:   Supervising Provider    Answer:   TOWER, MARNE A [[id number]    Return if symptoms worsen or fail to improve.  Romilda Garret, NP

## 2021-10-16 NOTE — Assessment & Plan Note (Signed)
Likely related to increased prostate volume and urinary tract infection.  Treat with antibiotics first if no improvement consider adding tamsulosin

## 2021-10-16 NOTE — Assessment & Plan Note (Signed)
We will treat with Keflex again since patient responded.  If he has recurrent symptoms need to consider prostatitis.  Was going to treat with Bactrim but patient is on spironolactone and risk for hyperkalemia would be too great.  Can consider fluoroquinolone if needed

## 2021-10-16 NOTE — Patient Instructions (Addendum)
I sent in antibiotics to the pharmacy I want you to call the urology office at   [facility]  Address: [address], [place],  [address] Hours:  Open ? Closes 5?PM Phone: [phone]

## 2021-10-19 LAB — URINE CULTURE
MICRO NUMBER:: 13820640
SPECIMEN QUALITY:: ADEQUATE

## 2021-10-22 DIAGNOSIS — N3000 Acute cystitis without hematuria: Secondary | ICD-10-CM | POA: Diagnosis not present

## 2021-11-19 DIAGNOSIS — N3000 Acute cystitis without hematuria: Secondary | ICD-10-CM | POA: Diagnosis not present

## 2021-11-19 DIAGNOSIS — R8279 Other abnormal findings on microbiological examination of urine: Secondary | ICD-10-CM | POA: Diagnosis not present

## 2021-11-19 DIAGNOSIS — C61 Malignant neoplasm of prostate: Secondary | ICD-10-CM | POA: Diagnosis not present

## 2021-12-02 ENCOUNTER — Other Ambulatory Visit (INDEPENDENT_AMBULATORY_CARE_PROVIDER_SITE_OTHER): Payer: Medicare HMO

## 2021-12-02 DIAGNOSIS — Z8546 Personal history of malignant neoplasm of prostate: Secondary | ICD-10-CM

## 2021-12-02 DIAGNOSIS — R972 Elevated prostate specific antigen [PSA]: Secondary | ICD-10-CM

## 2021-12-02 LAB — PSA, MEDICARE: PSA: 6.81 ng/ml — ABNORMAL HIGH (ref 0.10–4.00)

## 2021-12-09 ENCOUNTER — Other Ambulatory Visit: Payer: Self-pay | Admitting: Cardiology

## 2021-12-12 ENCOUNTER — Telehealth: Payer: Self-pay | Admitting: Primary Care

## 2021-12-12 NOTE — Telephone Encounter (Signed)
Spoke with granddaughter, Caryl Pina about PSA lab results. Advised of Kates recommendation to follow up with urology. She stated they will follow up with urology and discuss options.

## 2021-12-12 NOTE — Telephone Encounter (Signed)
Granddaughter called in to receive information regarding lab work that was done on 12/02/21. She would like a phone call at  [phone]. She is on patients DPR.

## 2021-12-21 ENCOUNTER — Other Ambulatory Visit: Payer: Self-pay | Admitting: Cardiology

## 2021-12-21 DIAGNOSIS — Z951 Presence of aortocoronary bypass graft: Secondary | ICD-10-CM

## 2021-12-21 DIAGNOSIS — E7800 Pure hypercholesterolemia, unspecified: Secondary | ICD-10-CM

## 2021-12-21 DIAGNOSIS — I5022 Chronic systolic (congestive) heart failure: Secondary | ICD-10-CM

## 2021-12-21 DIAGNOSIS — I2510 Atherosclerotic heart disease of native coronary artery without angina pectoris: Secondary | ICD-10-CM

## 2022-01-27 ENCOUNTER — Other Ambulatory Visit: Payer: Self-pay | Admitting: Cardiology

## 2022-02-26 DIAGNOSIS — R972 Elevated prostate specific antigen [PSA]: Secondary | ICD-10-CM | POA: Diagnosis not present

## 2022-03-05 DIAGNOSIS — R3912 Poor urinary stream: Secondary | ICD-10-CM | POA: Diagnosis not present

## 2022-03-05 DIAGNOSIS — R3915 Urgency of urination: Secondary | ICD-10-CM | POA: Diagnosis not present

## 2022-03-05 DIAGNOSIS — C61 Malignant neoplasm of prostate: Secondary | ICD-10-CM | POA: Diagnosis not present

## 2022-03-26 ENCOUNTER — Ambulatory Visit: Payer: Medicare HMO | Attending: Cardiology | Admitting: Cardiology

## 2022-03-26 ENCOUNTER — Encounter: Payer: Self-pay | Admitting: Cardiology

## 2022-03-26 VITALS — BP 118/56 | HR 70 | Ht 72.5 in | Wt 210.2 lb

## 2022-03-26 DIAGNOSIS — E7800 Pure hypercholesterolemia, unspecified: Secondary | ICD-10-CM | POA: Diagnosis not present

## 2022-03-26 DIAGNOSIS — Z79899 Other long term (current) drug therapy: Secondary | ICD-10-CM

## 2022-03-26 DIAGNOSIS — I2510 Atherosclerotic heart disease of native coronary artery without angina pectoris: Secondary | ICD-10-CM | POA: Diagnosis not present

## 2022-03-26 DIAGNOSIS — I5022 Chronic systolic (congestive) heart failure: Secondary | ICD-10-CM | POA: Diagnosis not present

## 2022-03-26 NOTE — Patient Instructions (Signed)
Medication Instructions:  The current medical regimen is effective;  continue present plan and medications.  *If you need a refill on your cardiac medications before your next appointment, please call your pharmacy*   Lab Work: Please have blood work today (CBC, CMP and Lipid)  If you have labs (blood work) drawn today and your tests are completely normal, you will receive your results only by: MyChart Message (if you have MyChart) OR A paper copy in the mail If you have any lab test that is abnormal or we need to change your treatment, we will call you to review the results.   Follow-Up: At [facility], you and your health needs are our priority.  As part of our continuing mission to provide you with exceptional heart care, we have created designated Provider Care Teams.  These Care Teams include your primary Cardiologist (physician) and Advanced Practice Providers (APPs -  Physician Assistants and Nurse Practitioners) who all work together to provide you with the care you need, when you need it.  We recommend signing up for the patient portal called "MyChart".  Sign up information is provided on this After Visit Summary.  MyChart is used to connect with patients for Virtual Visits (Telemedicine).  Patients are able to view lab/test results, encounter notes, upcoming appointments, etc.  Non-urgent messages can be sent to your provider as well.   To learn more about what you can do with MyChart, go to [email or web address].    Your next appointment:   1 year(s)  Provider:   Candee Furbish, MD

## 2022-03-26 NOTE — Progress Notes (Unsigned)
Cardiology Office Note:    Date:  03/27/2022   ID:  Shane Kim, DOB [birth year], MRN [id number]  PCP:  Pleas Koch, NP   [place]  Cardiologist:  Candee Furbish, MD  Advanced Practice Provider:  No care team member to display Electrophysiologist:  None       Referring MD: Pleas Koch, NP    History of Present Illness:    Shane Kim is a 77 y.o. male here for the follow-up of congestive heart failure, coronary artery disease, and hyperlipidemia.  Ischemic cardiomyopathy coronary artery disease prior CABG 2017 EF 20%.  Last EF 50%.  At his last appointment, overall been doing quite well.  Has been working as a crossing guard.  No chest pain no shortness of breath.  BP has been soft.  No fainting however.  No orthopnea no PND.  Today: Overall he is feeling well. 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  Granddaughter here with him today.  Feels well.  He is wearing a [employer] wool hat.  He denies any palpitations, chest pain, or shortness of breath. No lightheadedness, headaches, syncope, orthopnea, or PND. Also has no lower extremity edema or exertional symptoms.  Past Medical History:  Diagnosis Date   Acute systolic CHF (congestive heart failure) ([place])    a. echo 11/21/15: EF <20%, mildly dilated LV, severe diffuse global HK, GR3DD, mod MR, LA mildly dilated, RV sys fxn mildly reduced, PASP 52 mmHg   Anemia    CAD (coronary artery disease)    a. cardiac cath 11/22/15: dLM 40%, pLAD 80-90%, mLAD sequential 40%, D2 90%, mLCx 90% at takeoff of OM1, RCA appeared occluded proximally, LVEF 20%, PCWP 29    Chickenpox    Chronic diastolic CHF (congestive heart failure) (HCC)    HLD (hyperlipidemia)    Hypertension    Pre-diabetes    Prostate cancer [facility])     Past Surgical History:  Procedure Laterality Date   CARDIAC CATHETERIZATION N/A 11/22/2015   Procedure: Right/Left Heart Cath and Coronary Angiography;  Surgeon: Minna Merritts, MD;  Location: [place] [facility];  Service: Cardiovascular;  Laterality: N/A;   CARDIAC CATHETERIZATION Left 11/29/2015   Procedure: LEFT FEMORAL ARTERIAL LINE INSERTION;  Surgeon: Gaye Pollack, MD;  Location: [place];  Service: Open Heart Surgery;  Laterality: Left;   COLONOSCOPY WITH PROPOFOL N/A 02/14/2019   Procedure: COLONOSCOPY WITH PROPOFOL;  Surgeon: Toledo, Benay Pike, MD;  Location: [facility] ENDOSCOPY;  Service: Gastroenterology;  Laterality: N/A;   CORONARY ARTERY BYPASS GRAFT N/A 11/29/2015   Procedure: CORONARY ARTERY BYPASS GRAFTING (CABG) x four, using left internal mammary artery and right leg saphenous vein harvested endoscopically;  Surgeon: Gaye Pollack, MD;  Location: [place] OR;  Service: Open Heart Surgery;  Laterality: N/A;   TEE WITHOUT CARDIOVERSION N/A 11/29/2015   Procedure: TRANSESOPHAGEAL ECHOCARDIOGRAM (TEE);  Surgeon: Gaye Pollack, MD;  Location: [place];  Service: Open Heart Surgery;  Laterality: N/A;   TURP VAPORIZATION      Current Medications: Current Meds  Medication Sig   aspirin EC (ASPIRIN LOW DOSE) 81 MG tablet TAKE 1 TABLET(81 MG) BY MOUTH DAILY. SWALLOW WHOLE   carvedilol (COREG) 3.125 MG tablet TAKE 1 TABLET(3.125 MG) BY MOUTH TWICE DAILY WITH A MEAL   ezetimibe (ZETIA) 10 MG tablet TAKE 1 TABLET(10 MG) BY MOUTH DAILY   finasteride (PROSCAR) 5 MG tablet TAKE 1 TABLET BY MOUTH EVERY DAY for urine flow  losartan (COZAAR) 25 MG tablet TAKE 1 TABLET BY MOUTH EVERY DAY FOR BLOOD PRESSURE   rosuvastatin (CRESTOR) 40 MG tablet Take 1 tablet (40 mg total) by mouth daily. For cholesterol.   spironolactone (ALDACTONE) 25 MG tablet Take 0.5 tablets (12.5 mg total) by mouth daily.     Allergies:   Patient has no known allergies.   Social History   Socioeconomic History   Marital status: Married    Spouse name: BRENDA   Number of children: 2   Years of education: Not on file   Highest education level: Not on file  Occupational History   Not on file   Tobacco Use   Smoking status: Never   Smokeless tobacco: Never  Vaping Use   Vaping Use: Never used  Substance and Sexual Activity   Alcohol use: No   Drug use: No   Sexual activity: Not Currently  Other Topics Concern   Not on file  Social History Narrative   Married. Lives with his wife.   2 children, 2 grandchildren.   Retired. Worked as a Furniture conservator/restorer.   Enjoys fishing.    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   Social Determinants of Health   Financial Resource Strain: Low Risk  (05/22/2021)   Overall Financial Resource Strain (CARDIA)    Difficulty of Paying Living Expenses: Not hard at all  Food Insecurity: No Food Insecurity (05/22/2021)   Hunger Vital Sign    Worried About Running Out of Food in the Last Year: Never true    Ran Out of Food in the Last Year: Never true  Transportation Needs: No Transportation Needs (05/22/2021)   PRAPARE - Hydrologist (Medical): No    Lack of Transportation (Non-Medical): No  Physical Activity: Sufficiently Active (05/22/2021)   Exercise Vital Sign    Days of Exercise per Week: 5 days    Minutes of Exercise per Session: 30 min  Stress: No Stress Concern Present (05/22/2021)   [place]    Feeling of Stress : Not at all  Social Connections: [place] (05/22/2021)   Social Connection and Isolation Panel [NHANES]    Frequency of Communication with Friends and Family: More than three times a week    Frequency of Social Gatherings with Friends and Family: More than three times a week    Attends Religious Services: More than 4 times per year    Active Member of [employer] or Organizations: Yes    Attends Music therapist: More than 4 times per year    Marital Status: Married     Family History: The patient's family history includes Healthy in his father; Hypertension in his mother.  ROS:   Please see the history  of present illness.    All other systems reviewed and are negative.  EKGs/Labs/Other Studies Reviewed:    The following studies were reviewed today:  ECHO IMPRESSIONS March 2020  1. The left ventricle has mildly reduced systolic function, with an  ejection fraction of 45-50%. The cavity size was mildly dilated. There is  mildly increased left ventricular wall thickness. Left ventricular  diastolic parameters were normal.   2. Mid and basal inferior wall akinesis distal septal hypokinesis.   3. The right ventricle has normal systolic function. The cavity was  normal. There is no increase in right ventricular wall thickness.   4. Left atrial size was mildly dilated.   5. The mitral valve  is degenerative. Mild thickening of the mitral valve  leaflet. Mild calcification of the mitral valve leaflet. There is moderate  mitral annular calcification present.   6. The tricuspid valve is normal in structure.   7. The aortic valve is tricuspid Severely thickening of the aortic valve  Sclerosis without any evidence of stenosis of the aortic valve. Aortic  valve regurgitation is trivial by color flow Doppler.   8. The pulmonic valve was grossly normal. Pulmonic valve regurgitation is  mild by color flow Doppler.   9. The aortic root is normal in size and structure.    RHC/LHC 11/22/2015: LM lesion, 40 %stenosed. Prox RCA to Dist RCA lesion, 100 %stenosed. Ost 2nd Mrg lesion, 100 %stenosed. 2nd Mrg lesion, 95 %stenosed. Ost LAD to Prox LAD lesion, 90 %stenosed. Ost 2nd Diag lesion, 90 %stenosed. Mid LAD lesion, 40 %stenosed. Dist LAD lesion, 40 %stenosed. Prox Cx lesion, 90 %stenosed. The left ventricular ejection fraction is less than 25% by visual estimate. There is severe left ventricular systolic dysfunction. Hemodynamic findings consistent with moderate pulmonary hypertension.  EKG:  EKG is personally reviewed and interpreted. 03/26/2022-sinus rhythm 67 first-degree AV  block 11/27/2020: EKG was not ordered today. 04/18/2020: sinus rhythm 67 with no other abnormalities.  Recent Labs: 03/26/2022: ALT 14; BUN 20; Creatinine, Ser 1.50; Hemoglobin 12.2; Platelets 145; Potassium 5.2; Sodium 140   Recent Lipid Panel    Component Value Date/Time   CHOL 161 03/26/2022 1012   TRIG 58 03/26/2022 1012   HDL 52 03/26/2022 1012   CHOLHDL 3.1 03/26/2022 1012   CHOLHDL 3 05/29/2021 1146   VLDL 15.4 05/29/2021 1146   LDLCALC 97 03/26/2022 1012     Risk Assessment/Calculations:      Physical Exam:    VS:  BP (!) 118/56   Pulse 70   Ht 6' 0.5" (1.842 m)   Wt 210 lb 3.2 oz (95.3 kg)   SpO2 98%   BMI 28.12 kg/m     Wt Readings from Last 3 Encounters:  03/26/22 210 lb 3.2 oz (95.3 kg)  10/16/21 211 lb 6 oz (95.9 kg)  10/02/21 213 lb (96.6 kg)     GEN: Well nourished, well developed in no acute distress HEENT: Normal NECK: No JVD; No carotid bruits LYMPHATICS: No lymphadenopathy CARDIAC: RRR, no murmurs, rubs, gallops, CABG scar RESPIRATORY:  Clear to auscultation without rales, wheezing or rhonchi  ABDOMEN: Soft, non-tender, non-distended MUSCULOSKELETAL:  No edema; No deformity  SKIN: Warm and dry NEUROLOGIC:  Alert and oriented x 3 PSYCHIATRIC:  Normal affect   ASSESSMENT:    1. Coronary artery disease involving native coronary artery of native heart without angina pectoris   2. Medication management   3. Chronic systolic congestive heart failure ([place])   4. Pure hypercholesterolemia      PLAN:    In order of problems listed above:  Coronary artery disease EF originally 20, now 45-50.  NYHA class I.  Doing quite well.  Walking.  No chest pain.  Continue with carvedilol, however we have clarified dose of 3.125 mg twice a day.  Losartan Aldactone.  Even though blood pressures are fairly soft, no syncope no dizziness.  No changes in medical management.  Congestive heart failure (CHF) ([place]) Originally of 20.  Overall doing quite well  currently.  Most recently EF 50%.  Continue with low doses of medications, could not tolerate because of hypotension.  Doing very well.  No changes in medical management.  HLD (hyperlipidemia) He is on  both Crestor 40 as well as Zetia 10 mg.  Ideally would like to see LDL less than 70.  It was between 90 and 100.  Continue to work with diet, exercise.  Could consider PCSK9 inhibitor if need be in the future     Follow-up: 12 months.  Medication Adjustments/Labs and Tests Ordered: Current medicines are reviewed at length with the patient today.  Concerns regarding medicines are outlined above.   Orders Placed This Encounter  Procedures   CBC   Comprehensive metabolic panel   Lipid panel   EKG 12-Lead    No orders of the defined types were placed in this encounter.   Patient Instructions  Medication Instructions:  The current medical regimen is effective;  continue present plan and medications.  *If you need a refill on your cardiac medications before your next appointment, please call your pharmacy*   Lab Work: Please have blood work today (CBC, CMP and Lipid)  If you have labs (blood work) drawn today and your tests are completely normal, you will receive your results only by: MyChart Message (if you have MyChart) OR A paper copy in the mail If you have any lab test that is abnormal or we need to change your treatment, we will call you to review the results.   Follow-Up: At [facility], you and your health needs are our priority.  As part of our continuing mission to provide you with exceptional heart care, we have created designated Provider Care Teams.  These Care Teams include your primary Cardiologist (physician) and Advanced Practice Providers (APPs -  Physician Assistants and Nurse Practitioners) who all work together to provide you with the care you need, when you need it.  We recommend signing up for the patient portal called "MyChart".  Sign up information  is provided on this After Visit Summary.  MyChart is used to connect with patients for Virtual Visits (Telemedicine).  Patients are able to view lab/test results, encounter notes, upcoming appointments, etc.  Non-urgent messages can be sent to your provider as well.   To learn more about what you can do with MyChart, go to [email or web address].    Your next appointment:   1 year(s)  Provider:   Candee Furbish, MD        Signed, Candee Furbish, MD  03/27/2022 6:00 AM    [place]

## 2022-03-27 LAB — CBC
Hematocrit: 37.4 % — ABNORMAL LOW (ref 37.5–51.0)
Hemoglobin: 12.2 g/dL — ABNORMAL LOW (ref 13.0–17.7)
MCH: 31.4 pg (ref 26.6–33.0)
MCHC: 32.6 g/dL (ref 31.5–35.7)
MCV: 96 fL (ref 79–97)
Platelets: 145 10*3/uL — ABNORMAL LOW (ref 150–450)
RBC: 3.88 x10E6/uL — ABNORMAL LOW (ref 4.14–5.80)
RDW: 12.7 % (ref 11.6–15.4)
WBC: 2.6 10*3/uL — ABNORMAL LOW (ref 3.4–10.8)

## 2022-03-27 LAB — COMPREHENSIVE METABOLIC PANEL
ALT: 14 IU/L (ref 0–44)
AST: 27 IU/L (ref 0–40)
Albumin/Globulin Ratio: 1.5 (ref 1.2–2.2)
Albumin: 4.3 g/dL (ref 3.8–4.8)
Alkaline Phosphatase: 59 IU/L (ref 44–121)
BUN/Creatinine Ratio: 13 (ref 10–24)
BUN: 20 mg/dL (ref 8–27)
Bilirubin Total: 0.6 mg/dL (ref 0.0–1.2)
CO2: 22 mmol/L (ref 20–29)
Calcium: 9.7 mg/dL (ref 8.6–10.2)
Chloride: 105 mmol/L (ref 96–106)
Creatinine, Ser: 1.5 mg/dL — ABNORMAL HIGH (ref 0.76–1.27)
Globulin, Total: 2.9 g/dL (ref 1.5–4.5)
Glucose: 89 mg/dL (ref 70–99)
Potassium: 5.2 mmol/L (ref 3.5–5.2)
Sodium: 140 mmol/L (ref 134–144)
Total Protein: 7.2 g/dL (ref 6.0–8.5)
eGFR: 47 mL/min/{1.73_m2} — ABNORMAL LOW (ref >59)

## 2022-03-27 LAB — LIPID PANEL
Chol/HDL Ratio: 3.1 ratio (ref 0.0–5.0)
Cholesterol, Total: 161 mg/dL (ref 100–199)
HDL: 52 mg/dL (ref >39)
LDL Chol Calc (NIH): 97 mg/dL (ref 0–99)
Triglycerides: 58 mg/dL (ref 0–149)
VLDL Cholesterol Cal: 12 mg/dL (ref 5–40)

## 2022-04-05 DIAGNOSIS — U071 COVID-19: Secondary | ICD-10-CM | POA: Diagnosis not present

## 2022-04-08 ENCOUNTER — Encounter: Payer: Self-pay | Admitting: *Deleted

## 2022-04-10 ENCOUNTER — Telehealth: Payer: Self-pay | Admitting: Cardiology

## 2022-04-10 NOTE — Telephone Encounter (Signed)
I have patient's granddaughter on the line to get patient's lab results

## 2022-04-10 NOTE — Telephone Encounter (Signed)
Reviewed results of recent ;ab with granddaughter Caryl Pina - [place] per DPR.  All questions, if any were answered at the time of the call.

## 2022-04-20 ENCOUNTER — Other Ambulatory Visit: Payer: Self-pay | Admitting: Cardiology

## 2022-05-09 DIAGNOSIS — R972 Elevated prostate specific antigen [PSA]: Secondary | ICD-10-CM | POA: Diagnosis not present

## 2022-05-09 DIAGNOSIS — C61 Malignant neoplasm of prostate: Secondary | ICD-10-CM | POA: Diagnosis not present

## 2022-05-20 DIAGNOSIS — N5201 Erectile dysfunction due to arterial insufficiency: Secondary | ICD-10-CM | POA: Diagnosis not present

## 2022-05-20 DIAGNOSIS — R3915 Urgency of urination: Secondary | ICD-10-CM | POA: Diagnosis not present

## 2022-05-20 DIAGNOSIS — C61 Malignant neoplasm of prostate: Secondary | ICD-10-CM | POA: Diagnosis not present

## 2022-05-20 DIAGNOSIS — R3912 Poor urinary stream: Secondary | ICD-10-CM | POA: Diagnosis not present

## 2022-05-21 ENCOUNTER — Other Ambulatory Visit: Payer: Self-pay | Admitting: Cardiology

## 2022-05-21 DIAGNOSIS — I420 Dilated cardiomyopathy: Secondary | ICD-10-CM

## 2022-05-23 ENCOUNTER — Other Ambulatory Visit (HOSPITAL_COMMUNITY): Payer: Self-pay | Admitting: Urology

## 2022-05-23 DIAGNOSIS — C61 Malignant neoplasm of prostate: Secondary | ICD-10-CM

## 2022-05-28 ENCOUNTER — Telehealth: Payer: Self-pay | Admitting: Radiation Oncology

## 2022-05-28 NOTE — Telephone Encounter (Signed)
Left message for patient to call back to schedule consult per 4/3 referral.

## 2022-05-29 ENCOUNTER — Other Ambulatory Visit: Payer: Self-pay | Admitting: Cardiology

## 2022-05-29 ENCOUNTER — Other Ambulatory Visit: Payer: Self-pay | Admitting: Primary Care

## 2022-05-29 ENCOUNTER — Telehealth: Payer: Self-pay | Admitting: Radiation Oncology

## 2022-05-29 ENCOUNTER — Ambulatory Visit (INDEPENDENT_AMBULATORY_CARE_PROVIDER_SITE_OTHER): Payer: Medicare HMO

## 2022-05-29 VITALS — Ht 72.0 in

## 2022-05-29 DIAGNOSIS — I420 Dilated cardiomyopathy: Secondary | ICD-10-CM

## 2022-05-29 DIAGNOSIS — Z0000 Encounter for general adult medical examination without abnormal findings: Secondary | ICD-10-CM

## 2022-05-29 NOTE — Patient Instructions (Signed)
Shane Kim , Thank you for taking time to come for your Medicare Wellness Visit. I appreciate your ongoing commitment to your health goals. Please review the following plan we discussed and let me know if I can assist you in the future.   These are the goals we discussed:  Goals      DIET - INCREASE WATER INTAKE     Starting 02/08/2018, I will continue to drink at least 1 gallon of water daily.      Patient Stated     03/03/2019, I will maintain and continue medications as prescribed.         This is a list of the screening recommended for you and due dates:  Health Maintenance  Topic Date Due   COVID-19 Vaccine (1) Never done   DTaP/Tdap/Td vaccine (1 - Tdap) Never done   Flu Shot  09/25/2022   Medicare Annual Wellness Visit  05/29/2023   Pneumonia Vaccine  Completed   Hepatitis C Screening: USPSTF Recommendation to screen - Ages 18-79 yo.  Completed   Zoster (Shingles) Vaccine  Completed   HPV Vaccine  Aged Out   Colon Cancer Screening  Discontinued    Advanced directives: none  Conditions/risks identified: none  Next appointment: Follow up in one year for your annual wellness visit. 06/01/23 @ 11:00 telephone visit.  Preventive Care 58 Years and Older, Male  Preventive care refers to lifestyle choices and visits with your health care provider that can promote health and wellness. What does preventive care include? A yearly physical exam. This is also called an annual well check. Dental exams once or twice a year. Routine eye exams. Ask your health care provider how often you should have your eyes checked. Personal lifestyle choices, including: Daily care of your teeth and gums. Regular physical activity. Eating a healthy diet. Avoiding tobacco and drug use. Limiting alcohol use. Practicing safe sex. Taking low doses of aspirin every day. Taking vitamin and mineral supplements as recommended by your health care provider. What happens during an annual well  check? The services and screenings done by your health care provider during your annual well check will depend on your age, overall health, lifestyle risk factors, and family history of disease. Counseling  Your health care provider may ask you questions about your: Alcohol use. Tobacco use. Drug use. Emotional well-being. Home and relationship well-being. Sexual activity. Eating habits. History of falls. Memory and ability to understand (cognition). Work and work Statistician. Screening  You may have the following tests or measurements: Height, weight, and BMI. Blood pressure. Lipid and cholesterol levels. These may be checked every 5 years, or more frequently if you are over 63 years old. Skin check. Lung cancer screening. You may have this screening every year starting at age 37 if you have a 30-pack-year history of smoking and currently smoke or have quit within the past 15 years. Fecal occult blood test (FOBT) of the stool. You may have this test every year starting at age 72. Flexible sigmoidoscopy or colonoscopy. You may have a sigmoidoscopy every 5 years or a colonoscopy every 10 years starting at age 66. Prostate cancer screening. Recommendations will vary depending on your family history and other risks. Hepatitis C blood test. Hepatitis B blood test. Sexually transmitted disease (STD) testing. Diabetes screening. This is done by checking your blood sugar (glucose) after you have not eaten for a while (fasting). You may have this done every 1-3 years. Abdominal aortic aneurysm (AAA) screening. You may need  this if you are a current or former smoker. Osteoporosis. You may be screened starting at age 80 if you are at high risk. Talk with your health care provider about your test results, treatment options, and if necessary, the need for more tests. Vaccines  Your health care provider may recommend certain vaccines, such as: Influenza vaccine. This is recommended every  year. Tetanus, diphtheria, and acellular pertussis (Tdap, Td) vaccine. You may need a Td booster every 10 years. Zoster vaccine. You may need this after age 63. Pneumococcal 13-valent conjugate (PCV13) vaccine. One dose is recommended after age 52. Pneumococcal polysaccharide (PPSV23) vaccine. One dose is recommended after age 46. Talk to your health care provider about which screenings and vaccines you need and how often you need them. This information is not intended to replace advice given to you by your health care provider. Make sure you discuss any questions you have with your health care provider. Document Released: 03/09/2015 Document Revised: 10/31/2015 Document Reviewed: 12/12/2014 Elsevier Interactive Patient Education  2017 [place] Prevention in the Home Falls can cause injuries. They can happen to people of all ages. There are many things you can do to make your home safe and to help prevent falls. What can I do on the outside of my home? Regularly fix the edges of walkways and driveways and fix any cracks. Remove anything that might make you trip as you walk through a door, such as a raised step or threshold. Trim any bushes or trees on the path to your home. Use bright outdoor lighting. Clear any walking paths of anything that might make someone trip, such as rocks or tools. Regularly check to see if handrails are loose or broken. Make sure that both sides of any steps have handrails. Any raised decks and porches should have guardrails on the edges. Have any leaves, snow, or ice cleared regularly. Use sand or salt on walking paths during winter. Clean up any spills in your garage right away. This includes oil or grease spills. What can I do in the bathroom? Use night lights. Install grab bars by the toilet and in the tub and shower. Do not use towel bars as grab bars. Use non-skid mats or decals in the tub or shower. If you need to sit down in the shower, use a  plastic, non-slip stool. Keep the floor dry. Clean up any water that spills on the floor as soon as it happens. Remove soap buildup in the tub or shower regularly. Attach bath mats securely with double-sided non-slip rug tape. Do not have throw rugs and other things on the floor that can make you trip. What can I do in the bedroom? Use night lights. Make sure that you have a light by your bed that is easy to reach. Do not use any sheets or blankets that are too big for your bed. They should not hang down onto the floor. Have a firm chair that has side arms. You can use this for support while you get dressed. Do not have throw rugs and other things on the floor that can make you trip. What can I do in the kitchen? Clean up any spills right away. Avoid walking on wet floors. Keep items that you use a lot in easy-to-reach places. If you need to reach something above you, use a strong step stool that has a grab bar. Keep electrical cords out of the way. Do not use floor polish or wax that makes floors  slippery. If you must use wax, use non-skid floor wax. Do not have throw rugs and other things on the floor that can make you trip. What can I do with my stairs? Do not leave any items on the stairs. Make sure that there are handrails on both sides of the stairs and use them. Fix handrails that are broken or loose. Make sure that handrails are as long as the stairways. Check any carpeting to make sure that it is firmly attached to the stairs. Fix any carpet that is loose or worn. Avoid having throw rugs at the top or bottom of the stairs. If you do have throw rugs, attach them to the floor with carpet tape. Make sure that you have a light switch at the top of the stairs and the bottom of the stairs. If you do not have them, ask someone to add them for you. What else can I do to help prevent falls? Wear shoes that: Do not have high heels. Have rubber bottoms. Are comfortable and fit you  well. Are closed at the toe. Do not wear sandals. If you use a stepladder: Make sure that it is fully opened. Do not climb a closed stepladder. Make sure that both sides of the stepladder are locked into place. Ask someone to hold it for you, if possible. Clearly mark and make sure that you can see: Any grab bars or handrails. First and last steps. Where the edge of each step is. Use tools that help you move around (mobility aids) if they are needed. These include: Canes. Walkers. Scooters. Crutches. Turn on the lights when you go into a dark area. Replace any light bulbs as soon as they burn out. Set up your furniture so you have a clear path. Avoid moving your furniture around. If any of your floors are uneven, fix them. If there are any pets around you, be aware of where they are. Review your medicines with your doctor. Some medicines can make you feel dizzy. This can increase your chance of falling. Ask your doctor what other things that you can do to help prevent falls. This information is not intended to replace advice given to you by your health care provider. Make sure you discuss any questions you have with your health care provider. Document Released: 12/07/2008 Document Revised: 07/19/2015 Document Reviewed: 03/17/2014 Elsevier Interactive Patient Education  2017 [employer].

## 2022-05-29 NOTE — Patient Instructions (Signed)
Shane Kim , Thank you for taking time to come for your Medicare Wellness Visit. I appreciate your ongoing commitment to your health goals. Please review the following plan we discussed and let me know if I can assist you in the future.   These are the goals we discussed:  Goals      DIET - INCREASE WATER INTAKE     Starting 02/08/2018, I will continue to drink at least 1 gallon of water daily.      Patient Stated     03/03/2019, I will maintain and continue medications as prescribed.         This is a list of the screening recommended for you and due dates:  Health Maintenance  Topic Date Due   COVID-19 Vaccine (1) Never done   DTaP/Tdap/Td vaccine (1 - Tdap) Never done   Flu Shot  09/25/2022   Medicare Annual Wellness Visit  05/29/2023   Pneumonia Vaccine  Completed   Hepatitis C Screening: USPSTF Recommendation to screen - Ages 18-79 yo.  Completed   Zoster (Shingles) Vaccine  Completed   HPV Vaccine  Aged Out   Colon Cancer Screening  Discontinued    Advanced directives: none  Conditions/risks identified: none  Next appointment: Follow up in one year for your annual wellness visit. 06/01/23 @ 11:00 telephone visit.  Preventive Care 56 Years and Older, Male  Preventive care refers to lifestyle choices and visits with your health care provider that can promote health and wellness. What does preventive care include? A yearly physical exam. This is also called an annual well check. Dental exams once or twice a year. Routine eye exams. Ask your health care provider how often you should have your eyes checked. Personal lifestyle choices, including: Daily care of your teeth and gums. Regular physical activity. Eating a healthy diet. Avoiding tobacco and drug use. Limiting alcohol use. Practicing safe sex. Taking low doses of aspirin every day. Taking vitamin and mineral supplements as recommended by your health care provider. What happens during an annual well  check? The services and screenings done by your health care provider during your annual well check will depend on your age, overall health, lifestyle risk factors, and family history of disease. Counseling  Your health care provider may ask you questions about your: Alcohol use. Tobacco use. Drug use. Emotional well-being. Home and relationship well-being. Sexual activity. Eating habits. History of falls. Memory and ability to understand (cognition). Work and work Statistician. Screening  You may have the following tests or measurements: Height, weight, and BMI. Blood pressure. Lipid and cholesterol levels. These may be checked every 5 years, or more frequently if you are over 64 years old. Skin check. Lung cancer screening. You may have this screening every year starting at age 27 if you have a 30-pack-year history of smoking and currently smoke or have quit within the past 15 years. Fecal occult blood test (FOBT) of the stool. You may have this test every year starting at age 28. Flexible sigmoidoscopy or colonoscopy. You may have a sigmoidoscopy every 5 years or a colonoscopy every 10 years starting at age 17. Prostate cancer screening. Recommendations will vary depending on your family history and other risks. Hepatitis C blood test. Hepatitis B blood test. Sexually transmitted disease (STD) testing. Diabetes screening. This is done by checking your blood sugar (glucose) after you have not eaten for a while (fasting). You may have this done every 1-3 years. Abdominal aortic aneurysm (AAA) screening. You may need  this if you are a current or former smoker. Osteoporosis. You may be screened starting at age 80 if you are at high risk. Talk with your health care provider about your test results, treatment options, and if necessary, the need for more tests. Vaccines  Your health care provider may recommend certain vaccines, such as: Influenza vaccine. This is recommended every  year. Tetanus, diphtheria, and acellular pertussis (Tdap, Td) vaccine. You may need a Td booster every 10 years. Zoster vaccine. You may need this after age 63. Pneumococcal 13-valent conjugate (PCV13) vaccine. One dose is recommended after age 52. Pneumococcal polysaccharide (PPSV23) vaccine. One dose is recommended after age 46. Talk to your health care provider about which screenings and vaccines you need and how often you need them. This information is not intended to replace advice given to you by your health care provider. Make sure you discuss any questions you have with your health care provider. Document Released: 03/09/2015 Document Revised: 10/31/2015 Document Reviewed: 12/12/2014 Elsevier Interactive Patient Education  2017 [place] Prevention in the Home Falls can cause injuries. They can happen to people of all ages. There are many things you can do to make your home safe and to help prevent falls. What can I do on the outside of my home? Regularly fix the edges of walkways and driveways and fix any cracks. Remove anything that might make you trip as you walk through a door, such as a raised step or threshold. Trim any bushes or trees on the path to your home. Use bright outdoor lighting. Clear any walking paths of anything that might make someone trip, such as rocks or tools. Regularly check to see if handrails are loose or broken. Make sure that both sides of any steps have handrails. Any raised decks and porches should have guardrails on the edges. Have any leaves, snow, or ice cleared regularly. Use sand or salt on walking paths during winter. Clean up any spills in your garage right away. This includes oil or grease spills. What can I do in the bathroom? Use night lights. Install grab bars by the toilet and in the tub and shower. Do not use towel bars as grab bars. Use non-skid mats or decals in the tub or shower. If you need to sit down in the shower, use a  plastic, non-slip stool. Keep the floor dry. Clean up any water that spills on the floor as soon as it happens. Remove soap buildup in the tub or shower regularly. Attach bath mats securely with double-sided non-slip rug tape. Do not have throw rugs and other things on the floor that can make you trip. What can I do in the bedroom? Use night lights. Make sure that you have a light by your bed that is easy to reach. Do not use any sheets or blankets that are too big for your bed. They should not hang down onto the floor. Have a firm chair that has side arms. You can use this for support while you get dressed. Do not have throw rugs and other things on the floor that can make you trip. What can I do in the kitchen? Clean up any spills right away. Avoid walking on wet floors. Keep items that you use a lot in easy-to-reach places. If you need to reach something above you, use a strong step stool that has a grab bar. Keep electrical cords out of the way. Do not use floor polish or wax that makes floors  slippery. If you must use wax, use non-skid floor wax. Do not have throw rugs and other things on the floor that can make you trip. What can I do with my stairs? Do not leave any items on the stairs. Make sure that there are handrails on both sides of the stairs and use them. Fix handrails that are broken or loose. Make sure that handrails are as long as the stairways. Check any carpeting to make sure that it is firmly attached to the stairs. Fix any carpet that is loose or worn. Avoid having throw rugs at the top or bottom of the stairs. If you do have throw rugs, attach them to the floor with carpet tape. Make sure that you have a light switch at the top of the stairs and the bottom of the stairs. If you do not have them, ask someone to add them for you. What else can I do to help prevent falls? Wear shoes that: Do not have high heels. Have rubber bottoms. Are comfortable and fit you  well. Are closed at the toe. Do not wear sandals. If you use a stepladder: Make sure that it is fully opened. Do not climb a closed stepladder. Make sure that both sides of the stepladder are locked into place. Ask someone to hold it for you, if possible. Clearly mark and make sure that you can see: Any grab bars or handrails. First and last steps. Where the edge of each step is. Use tools that help you move around (mobility aids) if they are needed. These include: Canes. Walkers. Scooters. Crutches. Turn on the lights when you go into a dark area. Replace any light bulbs as soon as they burn out. Set up your furniture so you have a clear path. Avoid moving your furniture around. If any of your floors are uneven, fix them. If there are any pets around you, be aware of where they are. Review your medicines with your doctor. Some medicines can make you feel dizzy. This can increase your chance of falling. Ask your doctor what other things that you can do to help prevent falls. This information is not intended to replace advice given to you by your health care provider. Make sure you discuss any questions you have with your health care provider. Document Released: 12/07/2008 Document Revised: 07/19/2015 Document Reviewed: 03/17/2014 Elsevier Interactive Patient Education  2017 [employer].

## 2022-05-29 NOTE — Progress Notes (Signed)
I connected with  [facility] on 05/29/22 by a audio enabled telemedicine application and verified that I am speaking with the correct person using two identifiers.  Patient Location: Home  Provider Location: Home Office  I discussed the limitations of evaluation and management by telemedicine. The patient expressed understanding and agreed to proceed.  Subjective:   Shane Kim is a 77 y.o. male who presents for Medicare Annual/Subsequent preventive examination.  Review of Systems      Cardiac Risk Factors include: advanced age (>70men, >59 women)     Objective:    Today's Vitals   05/29/22 0825  Height: 6' (1.829 m)   Body mass index is 28.51 kg/m.     05/29/2022    8:33 AM 05/22/2021    9:44 AM 04/10/2020   10:00 AM 03/03/2019   11:16 AM 02/14/2019   10:59 AM 11/29/2018    1:25 PM 02/08/2018   10:41 AM  Advanced Directives  Does Patient Have a Medical Advance Directive? No No No Yes No No No  Type of Scientist, research (medical);Living will     Copy of [place] in Chart?    No - copy requested     Would patient like information on creating a medical advance directive? No - Patient declined No - Patient declined No - Patient declined  No - Guardian declined No - Patient declined No - Patient declined    Current Medications (verified) Outpatient Encounter Medications as of 05/29/2022  Medication Sig   aspirin EC (ASPIRIN LOW DOSE) 81 MG tablet TAKE 1 TABLET(81 MG) BY MOUTH DAILY. SWALLOW WHOLE   carvedilol (COREG) 3.125 MG tablet TAKE 1 TABLET(3.125 MG) BY MOUTH TWICE DAILY WITH A MEAL   ezetimibe (ZETIA) 10 MG tablet TAKE 1 TABLET(10 MG) BY MOUTH DAILY   finasteride (PROSCAR) 5 MG tablet TAKE 1 TABLET BY MOUTH EVERY DAY for urine flow   losartan (COZAAR) 25 MG tablet TAKE 1 TABLET BY MOUTH EVERY DAY FOR BLOOD PRESSURE   rosuvastatin (CRESTOR) 40 MG tablet Take 1 tablet (40 mg total) by mouth daily. For cholesterol.    spironolactone (ALDACTONE) 25 MG tablet TAKE 1/2 TABLET(12.5 MG) BY MOUTH DAILY   No facility-administered encounter medications on file as of 05/29/2022.    Allergies (verified) Patient has no known allergies.   History: Past Medical History:  Diagnosis Date   Acute systolic CHF (congestive heart failure)    a. echo 11/21/15: EF <20%, mildly dilated LV, severe diffuse global HK, GR3DD, mod MR, LA mildly dilated, RV sys fxn mildly reduced, PASP 52 mmHg   Anemia    CAD (coronary artery disease)    a. cardiac cath 11/22/15: dLM 40%, pLAD 80-90%, mLAD sequential 40%, D2 90%, mLCx 90% at takeoff of OM1, RCA appeared occluded proximally, LVEF 20%, PCWP 29    Chickenpox    Chronic diastolic CHF (congestive heart failure)    HLD (hyperlipidemia)    Hypertension    Pre-diabetes    Prostate cancer    Past Surgical History:  Procedure Laterality Date   CARDIAC CATHETERIZATION N/A 11/22/2015   Procedure: Right/Left Heart Cath and Coronary Angiography;  Surgeon: Minna Merritts, MD;  Location: [place] [facility];  Service: Cardiovascular;  Laterality: N/A;   CARDIAC CATHETERIZATION Left 11/29/2015   Procedure: LEFT FEMORAL ARTERIAL LINE INSERTION;  Surgeon: Gaye Pollack, MD;  Location: [facility];  Service: Open Heart Surgery;  Laterality: Left;   COLONOSCOPY WITH PROPOFOL  N/A 02/14/2019   Procedure: COLONOSCOPY WITH PROPOFOL;  Surgeon: Toledo, Benay Pike, MD;  Location: [facility];  Service: Gastroenterology;  Laterality: N/A;   CORONARY ARTERY BYPASS GRAFT N/A 11/29/2015   Procedure: CORONARY ARTERY BYPASS GRAFTING (CABG) x four, using left internal mammary artery and right leg saphenous vein harvested endoscopically;  Surgeon: Gaye Pollack, MD;  Location: [facility];  Service: Open Heart Surgery;  Laterality: N/A;   TEE WITHOUT CARDIOVERSION N/A 11/29/2015   Procedure: TRANSESOPHAGEAL ECHOCARDIOGRAM (TEE);  Surgeon: Gaye Pollack, MD;  Location: [place];  Service: Open Heart Surgery;  Laterality:  N/A;   TURP VAPORIZATION     Family History  Problem Relation Age of Onset   Hypertension Mother    Healthy Father    Social History   Socioeconomic History   Marital status: Married    Spouse name: BRENDA   Number of children: 2   Years of education: Not on file   Highest education level: Not on file  Occupational History   Not on file  Tobacco Use   Smoking status: Never   Smokeless tobacco: Never  Vaping Use   Vaping Use: Never used  Substance and Sexual Activity   Alcohol use: No   Drug use: No   Sexual activity: Not Currently  Other Topics Concern   Not on file  Social History Narrative   Married. Lives with his wife.   2 children, 2 grandchildren.   Retired. Worked as a Furniture conservator/restorer.   Enjoys fishing.    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   Social Determinants of Health   Financial Resource Strain: Low Risk  (05/29/2022)   Overall Financial Resource Strain (CARDIA)    Difficulty of Paying Living Expenses: Not hard at all  Food Insecurity: No Food Insecurity (05/29/2022)   Hunger Vital Sign    Worried About Running Out of Food in the Last Year: Never true    Ran Out of Food in the Last Year: Never true  Transportation Needs: No Transportation Needs (05/29/2022)   PRAPARE - Hydrologist (Medical): No    Lack of Transportation (Non-Medical): No  Physical Activity: Sufficiently Active (05/29/2022)   Exercise Vital Sign    Days of Exercise per Week: 5 days    Minutes of Exercise per Session: 30 min  Stress: No Stress Concern Present (05/29/2022)   [place]    Feeling of Stress : Not at all  Social Connections: [place] (05/29/2022)   Social Connection and Isolation Panel [NHANES]    Frequency of Communication with Friends and Family: More than three times a week    Frequency of Social Gatherings with Friends and Family: More than three times a week     Attends Religious Services: More than 4 times per year    Active Member of [employer] or Organizations: Yes    Attends Music therapist: More than 4 times per year    Marital Status: Married    Tobacco Counseling Counseling given: Not Answered   Clinical Intake:  Pre-visit preparation completed: Yes  Pain : No/denies pain     Nutritional Risks: None Diabetes: No  How often do you need to have someone help you when you read instructions, pamphlets, or other written materials from your doctor or pharmacy?: 1 - Never  Diabetic?no  Interpreter Needed?: No  Information entered by :: [employer] of Daily Living  05/29/2022    8:33 AM  In your present state of health, do you have any difficulty performing the following activities:  Hearing? 0  Vision? 0  Difficulty concentrating or making decisions? 0  Walking or climbing stairs? 0  Dressing or bathing? 0  Doing errands, shopping? 0  Preparing Food and eating ? N  Using the Toilet? N  In the past six months, have you accidently leaked urine? N  Do you have problems with loss of bowel control? N  Managing your Medications? N  Managing your Finances? N  Housekeeping or managing your Housekeeping? N    Patient Care Team: Pleas Koch, NP as PCP - General (Internal Medicine) Jerline Pain, MD as PCP - Cardiology (Cardiology)  Indicate any recent Medical Services you may have received from other than [facility] providers in the past year (date may be approximate).     Assessment:   This is a routine wellness examination for [place].  Hearing/Vision screen Hearing Screening - Comments:: No aids  Vision Screening - Comments:: No glasses - [facility]  Dietary issues and exercise activities discussed: Current Exercise Habits: Home exercise routine, Type of exercise: walking, Time (Minutes): 30, Frequency (Times/Week): 5, Weekly Exercise (Minutes/Week): 150, Exercise limited by: None  identified   Goals Addressed   None    Depression Screen    05/29/2022    8:32 AM 10/02/2021   12:07 PM 05/29/2021   11:09 AM 05/22/2021    9:41 AM 03/03/2019   11:26 AM 02/08/2018   10:41 AM 02/05/2017   11:24 AM  PHQ 2/9 Scores  PHQ - 2 Score 0 0 0 0 0 0 0  PHQ- 9 Score     0 0 0    Fall Risk    05/29/2022    8:28 AM 10/02/2021   12:07 PM 05/29/2021   11:08 AM 05/22/2021    9:44 AM 03/03/2019   11:18 AM  [place] in the past year? 0 0 0 0 0  Number falls in past yr: 0   0 0  Injury with Fall? 0  0 0 0  Risk for fall due to : No Fall Risks   No Fall Risks Medication side effect  Follow up Falls prevention discussed;Falls evaluation completed Falls evaluation completed Falls evaluation completed Falls prevention discussed Falls evaluation completed;Falls prevention discussed    FALL RISK PREVENTION PERTAINING TO THE HOME:  Any stairs in or around the home? No  If so, are there any without handrails? No  Home free of loose throw rugs in walkways, pet beds, electrical cords, etc? Yes  Adequate lighting in your home to reduce risk of falls? Yes   ASSISTIVE DEVICES UTILIZED TO PREVENT FALLS:  Life alert? No  Use of a cane, walker or w/c? No  Grab bars in the bathroom? No  Shower chair or bench in shower? No  Elevated toilet seat or a handicapped toilet? Yes    Cognitive Function:    03/03/2019   11:27 AM 02/08/2018   10:41 AM 02/05/2017   11:25 AM  MMSE - Mini Mental State Exam  Not completed: Refused    Orientation to time  5 5  Orientation to Place  5 5  Registration  3 3  Attention/ Calculation  0 0  Recall  3 3  Language- name 2 objects  0 0  Language- repeat  1 1  Language- follow 3 step command  3 3  Language- read &  follow direction  0 0  Write a sentence  0 0  Copy design  0 0  Total score  20 20        05/29/2022    8:35 AM 05/22/2021    9:46 AM  6CIT Screen  What Year? 0 points 0 points  What month? 0 points 0 points  What time? 0 points 0  points  Count back from 20 0 points 0 points  Months in reverse 0 points 4 points  Repeat phrase 0 points 2 points  Total Score 0 points 6 points    Immunizations Immunization History  Administered Date(s) Administered   Fluad Quad(high Dose 65+) 03/04/2020, 11/29/2020   Influenza, High Dose Seasonal PF 10/29/2015, 10/19/2016, 12/29/2017, 11/22/2018   Influenza-Unspecified 10/24/2016, 12/29/2017   PPD Test 01/21/2017   Pneumococcal Conjugate-13 10/29/2015, 02/05/2017   Pneumococcal Polysaccharide-23 02/05/2017   Zoster Recombinat (Shingrix) 03/28/2021, 06/10/2021    TDAP status: Due, Education has been provided regarding the importance of this vaccine. Advised may receive this vaccine at local pharmacy or Health Dept. Aware to provide a copy of the vaccination record if obtained from local pharmacy or Health Dept. Verbalized acceptance and understanding.  Flu Vaccine status: Up to date  Pneumococcal vaccine status: Up to date  Covid-19 vaccine status: Information provided on how to obtain vaccines.   Qualifies for Shingles Vaccine? Yes   Zostavax completed Yes   Shingrix Completed?: Yes  Screening Tests Health Maintenance  Topic Date Due   COVID-19 Vaccine (1) Never done   DTaP/Tdap/Td (1 - Tdap) Never done   Medicare Annual Wellness (AWV)  05/23/2022   INFLUENZA VACCINE  09/25/2022   Pneumonia Vaccine 55+ Years old  Completed   Hepatitis C Screening  Completed   Zoster Vaccines- Shingrix  Completed   HPV VACCINES  Aged Out   COLONOSCOPY (Pts 45-76yrs Insurance coverage will need to be confirmed)  Discontinued    Health Maintenance  Health Maintenance Due  Topic Date Due   COVID-19 Vaccine (1) Never done   DTaP/Tdap/Td (1 - Tdap) Never done   Medicare Annual Wellness (AWV)  05/23/2022    Colorectal cancer screening: No longer required.   Lung Cancer Screening: (Low Dose CT Chest recommended if Age 69-80 years, 30 pack-year currently smoking OR have quit w/in  15years.) does not qualify.   Lung Cancer Screening Referral: no  Additional Screening:  Hepatitis C Screening: does qualify; Completed 11/29/18  Vision Screening: Recommended annual ophthalmology exams for early detection of glaucoma and other disorders of the eye. Is the patient up to date with their annual eye exam?  No , pat will call to make appointment. Who is the provider or what is the name of the office in which the patient attends annual eye exams? [facility] If pt is not established with a provider, would they like to be referred to a provider to establish care? No .   Dental Screening: Recommended annual dental exams for proper oral hygiene  Community Resource Referral / Chronic Care Management: CRR required this visit?  No   CCM required this visit?  No      Plan:     I have personally reviewed and noted the following in the patient's chart:   Medical and social history Use of alcohol, tobacco or illicit drugs  Current medications and supplements including opioid prescriptions. Patient is not currently taking opioid prescriptions. Functional ability and status Nutritional status Physical activity Advanced directives List of other physicians Hospitalizations, surgeries,  and ER visits in previous 12 months Vitals Screenings to include cognitive, depression, and falls Referrals and appointments  In addition, I have reviewed and discussed with patient certain preventive protocols, quality metrics, and best practice recommendations. A written personalized care plan for preventive services as well as general preventive health recommendations were provided to patient.     Lebron Conners, LPN   [id number]   Nurse Notes: Wrong encounter appointment was cancelled during the visit - vist note redone and attached to new visit.   This encounter was created in error - please disregard.

## 2022-05-29 NOTE — Telephone Encounter (Signed)
Patient is due for CPE/follow up, this will be required prior to any further refills.  Please schedule, thank you!   

## 2022-05-29 NOTE — Telephone Encounter (Signed)
Left message for patient's granddaughter to call back to schedule consult per 4/3 referral.

## 2022-05-29 NOTE — Telephone Encounter (Signed)
Spoke to pt's wife, scheduled cpe for 06/13/22

## 2022-05-29 NOTE — Progress Notes (Signed)
I connected with  [facility] on 05/29/22 by a audio enabled telemedicine application and verified that I am speaking with the correct person using two identifiers.  Patient Location: Home  Provider Location: Home Office  I discussed the limitations of evaluation and management by telemedicine. The patient expressed understanding and agreed to proceed.  Subjective:   Kwadwo Rampey is a 77 y.o. male who presents for Medicare Annual/Subsequent preventive examination.  Review of Systems            Objective:    Today's Vitals   05/29/22 0857  Height: 6' (1.829 m)    Body mass index is 28.51 kg/m.     05/29/2022    8:33 AM 05/22/2021    9:44 AM 04/10/2020   10:00 AM 03/03/2019   11:16 AM 02/14/2019   10:59 AM 11/29/2018    1:25 PM 02/08/2018   10:41 AM  Advanced Directives  Does Patient Have a Medical Advance Directive? No No No Yes No No No  Type of Scientist, research (medical);Living will     Copy of [place] in Chart?    No - copy requested     Would patient like information on creating a medical advance directive? No - Patient declined No - Patient declined No - Patient declined  No - Guardian declined No - Patient declined No - Patient declined    Current Medications (verified) Outpatient Encounter Medications as of 05/29/2022  Medication Sig   aspirin EC (ASPIRIN LOW DOSE) 81 MG tablet TAKE 1 TABLET(81 MG) BY MOUTH DAILY. SWALLOW WHOLE   carvedilol (COREG) 3.125 MG tablet TAKE 1 TABLET(3.125 MG) BY MOUTH TWICE DAILY WITH A MEAL   ezetimibe (ZETIA) 10 MG tablet TAKE 1 TABLET(10 MG) BY MOUTH DAILY   finasteride (PROSCAR) 5 MG tablet TAKE 1 TABLET BY MOUTH EVERY DAY for urine flow   losartan (COZAAR) 25 MG tablet TAKE 1 TABLET BY MOUTH EVERY DAY FOR BLOOD PRESSURE   rosuvastatin (CRESTOR) 40 MG tablet Take 1 tablet (40 mg total) by mouth daily. For cholesterol.   spironolactone (ALDACTONE) 25 MG tablet TAKE 1/2 TABLET(12.5 MG)  BY MOUTH DAILY   No facility-administered encounter medications on file as of 05/29/2022.    Allergies (verified) Patient has no known allergies.   History: Past Medical History:  Diagnosis Date   Acute systolic CHF (congestive heart failure)    a. echo 11/21/15: EF <20%, mildly dilated LV, severe diffuse global HK, GR3DD, mod MR, LA mildly dilated, RV sys fxn mildly reduced, PASP 52 mmHg   Anemia    CAD (coronary artery disease)    a. cardiac cath 11/22/15: dLM 40%, pLAD 80-90%, mLAD sequential 40%, D2 90%, mLCx 90% at takeoff of OM1, RCA appeared occluded proximally, LVEF 20%, PCWP 29    Chickenpox    Chronic diastolic CHF (congestive heart failure)    HLD (hyperlipidemia)    Hypertension    Pre-diabetes    Prostate cancer    Past Surgical History:  Procedure Laterality Date   CARDIAC CATHETERIZATION N/A 11/22/2015   Procedure: Right/Left Heart Cath and Coronary Angiography;  Surgeon: Minna Merritts, MD;  Location: [place] [facility];  Service: Cardiovascular;  Laterality: N/A;   CARDIAC CATHETERIZATION Left 11/29/2015   Procedure: LEFT FEMORAL ARTERIAL LINE INSERTION;  Surgeon: Gaye Pollack, MD;  Location: [place];  Service: Open Heart Surgery;  Laterality: Left;   COLONOSCOPY WITH PROPOFOL N/A 02/14/2019   Procedure: COLONOSCOPY  WITH PROPOFOL;  Surgeon: Toledo, Benay Pike, MD;  Location: [facility];  Service: Gastroenterology;  Laterality: N/A;   CORONARY ARTERY BYPASS GRAFT N/A 11/29/2015   Procedure: CORONARY ARTERY BYPASS GRAFTING (CABG) x four, using left internal mammary artery and right leg saphenous vein harvested endoscopically;  Surgeon: Gaye Pollack, MD;  Location: [place] [facility];  Service: Open Heart Surgery;  Laterality: N/A;   TEE WITHOUT CARDIOVERSION N/A 11/29/2015   Procedure: TRANSESOPHAGEAL ECHOCARDIOGRAM (TEE);  Surgeon: Gaye Pollack, MD;  Location: [place];  Service: Open Heart Surgery;  Laterality: N/A;   TURP VAPORIZATION     Family History  Problem Relation  Age of Onset   Hypertension Mother    Healthy Father    Social History   Socioeconomic History   Marital status: Married    Spouse name: BRENDA   Number of children: 2   Years of education: Not on file   Highest education level: Not on file  Occupational History   Not on file  Tobacco Use   Smoking status: Never   Smokeless tobacco: Never  Vaping Use   Vaping Use: Never used  Substance and Sexual Activity   Alcohol use: No   Drug use: No   Sexual activity: Not Currently  Other Topics Concern   Not on file  Social History Narrative   Married. Lives with his wife.   2 children, 2 grandchildren.   Retired. Worked as a Furniture conservator/restorer.   Enjoys fishing.    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   Social Determinants of Health   Financial Resource Strain: Low Risk  (05/29/2022)   Overall Financial Resource Strain (CARDIA)    Difficulty of Paying Living Expenses: Not hard at all  Food Insecurity: No Food Insecurity (05/29/2022)   Hunger Vital Sign    Worried About Running Out of Food in the Last Year: Never true    Ran Out of Food in the Last Year: Never true  Transportation Needs: No Transportation Needs (05/29/2022)   PRAPARE - Hydrologist (Medical): No    Lack of Transportation (Non-Medical): No  Physical Activity: Sufficiently Active (05/29/2022)   Exercise Vital Sign    Days of Exercise per Week: 5 days    Minutes of Exercise per Session: 30 min  Stress: No Stress Concern Present (05/29/2022)   [place]    Feeling of Stress : Not at all  Social Connections: [place] (05/29/2022)   Social Connection and Isolation Panel [NHANES]    Frequency of Communication with Friends and Family: More than three times a week    Frequency of Social Gatherings with Friends and Family: More than three times a week    Attends Religious Services: More than 4 times per year    Active  Member of [employer] or Organizations: Yes    Attends Music therapist: More than 4 times per year    Marital Status: Married    Tobacco Counseling Counseling given: Not Answered   Clinical Intake:  Pre-visit preparation completed: Yes  Pain : No/denies pain     Nutritional Risks: None Diabetes: No  How often do you need to have someone help you when you read instructions, pamphlets, or other written materials from your doctor or pharmacy?: 1 - Never  Diabetic?no  Interpreter Needed?: No  Information entered by :: C.Sanya Kobrin LPN   Activities of Daily Living    05/29/2022  8:33 AM  In your present state of health, do you have any difficulty performing the following activities:  Hearing? 0  Vision? 0  Difficulty concentrating or making decisions? 0  Walking or climbing stairs? 0  Dressing or bathing? 0  Doing errands, shopping? 0  Preparing Food and eating ? N  Using the Toilet? N  In the past six months, have you accidently leaked urine? N  Do you have problems with loss of bowel control? N  Managing your Medications? N  Managing your Finances? N  Housekeeping or managing your Housekeeping? N    Patient Care Team: Pleas Koch, NP as PCP - General (Internal Medicine) Jerline Pain, MD as PCP - Cardiology (Cardiology)  Indicate any recent Medical Services you may have received from other than [facility] providers in the past year (date may be approximate).     Assessment:   This is a routine wellness examination for Shamier.  Hearing/Vision screen Hearing Screening - Comments:: No aids Vision Screening - Comments:: No glasses - [facility]  Dietary issues and exercise activities discussed:     Goals Addressed   None    Depression Screen    05/29/2022    8:32 AM 10/02/2021   12:07 PM 05/29/2021   11:09 AM 05/22/2021    9:41 AM 03/03/2019   11:26 AM 02/08/2018   10:41 AM 02/05/2017   11:24 AM  PHQ 2/9 Scores  PHQ - 2 Score 0 0 0 0 0 0 0   PHQ- 9 Score     0 0 0    Fall Risk    05/29/2022    9:02 AM 05/29/2022    8:28 AM 10/02/2021   12:07 PM 05/29/2021   11:08 AM 05/22/2021    9:44 AM  Fall Risk   Falls in the past year? 0 0 0 0 0  Number falls in past yr: 0 0   0  Injury with Fall? 0 0  0 0  Risk for fall due to : No Fall Risks No Fall Risks   No Fall Risks  Follow up Falls prevention discussed;Falls evaluation completed Falls prevention discussed;Falls evaluation completed Falls evaluation completed Falls evaluation completed Falls prevention discussed    FALL RISK PREVENTION PERTAINING TO THE HOME:  Any stairs in or around the home? No  If so, are there any without handrails? No  Home free of loose throw rugs in walkways, pet beds, electrical cords, etc? Yes  Adequate lighting in your home to reduce risk of falls? Yes   ASSISTIVE DEVICES UTILIZED TO PREVENT FALLS:  Life alert? No  Use of a cane, walker or w/c? No  Grab bars in the bathroom? No  Shower chair or bench in shower? No  Elevated toilet seat or a handicapped toilet? Yes    Cognitive Function:    03/03/2019   11:27 AM 02/08/2018   10:41 AM 02/05/2017   11:25 AM  MMSE - Mini Mental State Exam  Not completed: Refused    Orientation to time  5 5  Orientation to Place  5 5  Registration  3 3  Attention/ Calculation  0 0  Recall  3 3  Language- name 2 objects  0 0  Language- repeat  1 1  Language- follow 3 step command  3 3  Language- read & follow direction  0 0  Write a sentence  0 0  Copy design  0 0  Total score  20 20  05/29/2022    8:35 AM 05/22/2021    9:46 AM  6CIT Screen  What Year? 0 points 0 points  What month? 0 points 0 points  What time? 0 points 0 points  Count back from 20 0 points 0 points  Months in reverse 0 points 4 points  Repeat phrase 0 points 2 points  Total Score 0 points 6 points    Immunizations Immunization History  Administered Date(s) Administered   Fluad Quad(high Dose 65+) 03/04/2020, 11/29/2020    Influenza, High Dose Seasonal PF 10/29/2015, 10/19/2016, 12/29/2017, 11/22/2018   Influenza-Unspecified 10/24/2016, 12/29/2017   PPD Test 01/21/2017   Pneumococcal Conjugate-13 10/29/2015, 02/05/2017   Pneumococcal Polysaccharide-23 02/05/2017   Zoster Recombinat (Shingrix) 03/28/2021, 06/10/2021    TDAP status: Due, Education has been provided regarding the importance of this vaccine. Advised may receive this vaccine at local pharmacy or Health Dept. Aware to provide a copy of the vaccination record if obtained from local pharmacy or Health Dept. Verbalized acceptance and understanding.  Flu Vaccine status: Up to date  Pneumococcal vaccine status: Up to date  Covid-19 vaccine status: Information provided on how to obtain vaccines.   Qualifies for Shingles Vaccine? Yes   Zostavax completed Yes   Shingrix Completed?: Yes  Screening Tests Health Maintenance  Topic Date Due   COVID-19 Vaccine (1) Never done   DTaP/Tdap/Td (1 - Tdap) Never done   INFLUENZA VACCINE  09/25/2022   Medicare Annual Wellness (AWV)  05/29/2023   Pneumonia Vaccine 44+ Years old  Completed   Hepatitis C Screening  Completed   Zoster Vaccines- Shingrix  Completed   HPV VACCINES  Aged Out   COLONOSCOPY (Pts 45-24yrs Insurance coverage will need to be confirmed)  Discontinued    Health Maintenance  Health Maintenance Due  Topic Date Due   COVID-19 Vaccine (1) Never done   DTaP/Tdap/Td (1 - Tdap) Never done    Colorectal cancer screening: No longer required.   Lung Cancer Screening: (Low Dose CT Chest recommended if Age 65-80 years, 30 pack-year currently smoking OR have quit w/in 15years.) does not qualify.   Lung Cancer Screening Referral: no  Additional Screening:  Hepatitis C Screening: does qualify; Completed 11/29/18  Vision Screening: Recommended annual ophthalmology exams for early detection of glaucoma and other disorders of the eye. Is the patient up to date with their annual eye  exam?  No , pat will call to make appointment. Who is the provider or what is the name of the office in which the patient attends annual eye exams? [facility] If pt is not established with a provider, would they like to be referred to a provider to establish care? No .   Dental Screening: Recommended annual dental exams for proper oral hygiene  Community Resource Referral / Chronic Care Management: CRR required this visit?  No   CCM required this visit?  No      Plan:     I have personally reviewed and noted the following in the patient's chart:   Medical and social history Use of alcohol, tobacco or illicit drugs  Current medications and supplements including opioid prescriptions. Patient is not currently taking opioid prescriptions. Functional ability and status Nutritional status Physical activity Advanced directives List of other physicians Hospitalizations, surgeries, and ER visits in previous 12 months Vitals Screenings to include cognitive, depression, and falls Referrals and appointments  In addition, I have reviewed and discussed with patient certain preventive protocols, quality metrics, and best practice recommendations. A written  personalized care plan for preventive services as well as general preventive health recommendations were provided to patient.     Lebron Conners, LPN   [id number]   Nurse Notes: none

## 2022-06-05 ENCOUNTER — Other Ambulatory Visit: Payer: Self-pay | Admitting: Primary Care

## 2022-06-05 DIAGNOSIS — E785 Hyperlipidemia, unspecified: Secondary | ICD-10-CM

## 2022-06-07 ENCOUNTER — Other Ambulatory Visit: Payer: Self-pay | Admitting: Primary Care

## 2022-06-07 DIAGNOSIS — I420 Dilated cardiomyopathy: Secondary | ICD-10-CM

## 2022-06-10 ENCOUNTER — Other Ambulatory Visit: Payer: Self-pay | Admitting: Primary Care

## 2022-06-10 DIAGNOSIS — N320 Bladder-neck obstruction: Secondary | ICD-10-CM

## 2022-06-11 NOTE — Progress Notes (Signed)
GU Location of Tumor / Histology: Prostate Ca  If Prostate Cancer, Gleason Score is (4 + 4) and PSA is (7.35 on 02/26/2022)  Biopsies      06/16/2022 Dr. Bjorn Pippin NM PET (PSMA) Skull to Mid Thigh CLINICAL DATA:  Prostate cancer with prior cryotherapy. Biochemical recurrence. PSA of 7.4.  FINDINGS: NECK No radiotracer activity in neck lymph nodes.   Incidental CT finding: No cervical adenopathy. Bilateral carotid atherosclerosis.   CHEST No tracer avidity within thoracic nodes or pulmonary nodules.  Incidental CT finding: Median sternotomy for CABG. Mild cardiomegaly. Aortic atherosclerosis. The vague 7 mm left upper lobe pulmonary nodule is similar in 96/4. Below PET resolution.   ABDOMEN/PELVIS Prostate: Multifocal prostate affinity again identified. Within the base, right-sided tracer affinity measures a S.U.V. max of 11.0 today versus a S.U.V. max of 14.3 on the prior. This is contiguous with eccentric right but bilateral apical avidity at a S.U.V. max of 12.3 today versus a S.U.V. max of 12.2 on the prior.   Lymph nodes: No abnormal radiotracer accumulation within pelvic or abdominal nodes. Again identified is celiac ganglia physiologic tracer uptake including on 141/4 bilaterally.   Liver: No evidence of liver metastasis.   Incidental CT finding: Normal adrenal glands. Upper pole right renal 4.7 cm fluid density lesion is likely a cyst . In the absence of clinically indicated signs/symptoms require(s) no independent follow-up. Abdominal aortic atherosclerosis. Colonic stool burden suggests constipation.   SKELETON No focal activity to suggest skeletal metastasis.   IMPRESSION: 1. Multifocal prostatic tracer affinity, relatively similar. Consistent with residual disease. 2. No evidence of nodal, distant, or osseous tracer avid metastasis. 3. Left upper lobe 7 mm pulmonary nodule is below PET resolution, but unchanged and can be presumed benign. 4. Incidental findings,  including: Aortic Atherosclerosis (ICD10-I70.0). Possible constipation.    Past/Anticipated interventions by urology, if any:   Dr. Bjorn Pippin    Past/Anticipated interventions by medical oncology, if any: NA  Weight changes, if any:  No  IPSS:  2 SHIM: Refused (not sexually active at this time)  Bowel/Bladder complaints, if any:  No  Nausea/Vomiting, if any: No  Pain issues, if any:  0/10  SAFETY ISSUES: Prior radiation? No Pacemaker/ICD? No Possible current pregnancy? Male Is the patient on methotrexate? No  Current Complaints / other details:  No

## 2022-06-13 ENCOUNTER — Encounter: Payer: Self-pay | Admitting: Primary Care

## 2022-06-13 ENCOUNTER — Ambulatory Visit (INDEPENDENT_AMBULATORY_CARE_PROVIDER_SITE_OTHER): Payer: Medicare HMO | Admitting: Primary Care

## 2022-06-13 VITALS — BP 100/58 | HR 70 | Temp 98.0°F | Ht 72.0 in | Wt 204.0 lb

## 2022-06-13 DIAGNOSIS — I5022 Chronic systolic (congestive) heart failure: Secondary | ICD-10-CM

## 2022-06-13 DIAGNOSIS — Z8546 Personal history of malignant neoplasm of prostate: Secondary | ICD-10-CM | POA: Diagnosis not present

## 2022-06-13 DIAGNOSIS — I2510 Atherosclerotic heart disease of native coronary artery without angina pectoris: Secondary | ICD-10-CM

## 2022-06-13 DIAGNOSIS — Z0000 Encounter for general adult medical examination without abnormal findings: Secondary | ICD-10-CM

## 2022-06-13 DIAGNOSIS — N320 Bladder-neck obstruction: Secondary | ICD-10-CM | POA: Diagnosis not present

## 2022-06-13 DIAGNOSIS — R7303 Prediabetes: Secondary | ICD-10-CM

## 2022-06-13 DIAGNOSIS — E7800 Pure hypercholesterolemia, unspecified: Secondary | ICD-10-CM

## 2022-06-13 LAB — BASIC METABOLIC PANEL
BUN: 21 mg/dL (ref 6–23)
CO2: 28 mEq/L (ref 19–32)
Calcium: 9.4 mg/dL (ref 8.4–10.5)
Chloride: 107 mEq/L (ref 96–112)
Creatinine, Ser: 1.36 mg/dL (ref 0.40–1.50)
GFR: 49.69 mL/min — ABNORMAL LOW (ref >60.00)
Glucose, Bld: 95 mg/dL (ref 70–99)
Potassium: 5.2 mEq/L — ABNORMAL HIGH (ref 3.5–5.1)
Sodium: 140 mEq/L (ref 135–145)

## 2022-06-13 LAB — HEMOGLOBIN A1C: Hgb A1c MFr Bld: 6.3 % (ref 4.6–6.5)

## 2022-06-13 NOTE — Assessment & Plan Note (Signed)
Reviewed lipid panel from January 2024. Continue Zetia 10 mg daily, rosuvastatin 40 mg daily.

## 2022-06-13 NOTE — Assessment & Plan Note (Signed)
Controlled. Following with Urology.  Continue finasteride 5 mg daily. PSA reviewed from January 2024

## 2022-06-13 NOTE — Assessment & Plan Note (Signed)
Repeat A1C pending.  Discussed the importance of a healthy diet and regular exercise in order for weight loss, and to reduce the risk of further co-morbidity.  

## 2022-06-13 NOTE — Assessment & Plan Note (Signed)
Following with Dr. Annabell Howells per Urology.  PSA from January 2024 reviewed.

## 2022-06-13 NOTE — Assessment & Plan Note (Signed)
Immunizations UTD. Colonoscopy UTD, no further imaging needed PSA UTD  Discussed the importance of a healthy diet and regular exercise in order for weight loss, and to reduce the risk of further co-morbidity.  Exam stable. Labs pending.  Follow up in 1 year for repeat physical.

## 2022-06-13 NOTE — Patient Instructions (Signed)
Stop by the lab prior to leaving today. I will notify you of your results once received.   It was a pleasure to see you today!  

## 2022-06-13 NOTE — Assessment & Plan Note (Addendum)
Appears euvolemic.  Continue carvedilol 3.125 mg BID, spironolactone 12.5 mg daily, losartan 25 mg daily, BP control.

## 2022-06-13 NOTE — Assessment & Plan Note (Signed)
Asymptomatic.  Following with cardiology, office notes reviewed from January 2024.

## 2022-06-13 NOTE — Progress Notes (Signed)
Subjective:    Patient ID: Shane Kim, male    DOB: 1945-12-11, 77 y.o.   MRN: [id number]  HPI  Shane Kim is a very pleasant 77 y.o. male who presents today for complete physical and follow up of chronic conditions.  Immunizations: -Shingles: Completed Shingrix series -Pneumonia: Completed Prevnar 13 in 2018 and pneumovax 23 in 2018  Diet: Fair diet.  Exercise: No regular exercise.  Eye exam: Completes annually  Dental exam: Completes semi-annually    Colonoscopy: Completed in 2020, no further imaging  PSA: Due  BP Readings from Last 3 Encounters:  06/13/22 (!) 100/58  03/26/22 (!) 118/56  10/16/21 130/62         Review of Systems  Constitutional:  Negative for unexpected weight change.  HENT:  Negative for rhinorrhea.   Respiratory:  Negative for cough and shortness of breath.   Cardiovascular:  Negative for chest pain.  Gastrointestinal:  Negative for constipation and diarrhea.  Genitourinary:  Negative for difficulty urinating.  Musculoskeletal:  Negative for arthralgias and myalgias.  Skin:  Negative for rash.  Allergic/Immunologic: Negative for environmental allergies.  Neurological:  Negative for dizziness and headaches.  Psychiatric/Behavioral:  The patient is not nervous/anxious.          Past Medical History:  Diagnosis Date   Acute systolic CHF (congestive heart failure)    a. echo 11/21/15: EF <20%, mildly dilated LV, severe diffuse global HK, GR3DD, mod MR, LA mildly dilated, RV sys fxn mildly reduced, PASP 52 mmHg   Anemia    Bladder outlet obstruction 07/22/2013   CAD (coronary artery disease)    a. cardiac cath 11/22/15: dLM 40%, pLAD 80-90%, mLAD sequential 40%, D2 90%, mLCx 90% at takeoff of OM1, RCA appeared occluded proximally, LVEF 20%, PCWP 29    Chickenpox    Chronic diastolic CHF (congestive heart failure)    HLD (hyperlipidemia)    Hypertension    Pre-diabetes    Prostate cancer    Urinary retention 10/16/2021     Social History   Socioeconomic History   Marital status: Married    Spouse name: BRENDA   Number of children: 2   Years of education: Not on file   Highest education level: Not on file  Occupational History   Not on file  Tobacco Use   Smoking status: Never   Smokeless tobacco: Never  Vaping Use   Vaping Use: Never used  Substance and Sexual Activity   Alcohol use: No   Drug use: No   Sexual activity: Not Currently  Other Topics Concern   Not on file  Social History Narrative   Married. Lives with his wife.   2 children, 2 grandchildren.   Retired. Worked as a Chartered certified accountant.   Enjoys fishing.    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   Social Determinants of Health   Financial Resource Strain: Low Risk  (05/29/2022)   Overall Financial Resource Strain (CARDIA)    Difficulty of Paying Living Expenses: Not hard at all  Food Insecurity: No Food Insecurity (05/29/2022)   Hunger Vital Sign    Worried About Running Out of Food in the Last Year: Never true    Ran Out of Food in the Last Year: Never true  Transportation Needs: No Transportation Needs (05/29/2022)   PRAPARE - Administrator, Civil Service (Medical): No    Lack of Transportation (Non-Medical): No  Physical Activity: Sufficiently Active (05/29/2022)   Exercise Vital Sign    Days  of Exercise per Week: 5 days    Minutes of Exercise per Session: 30 min  Stress: No Stress Concern Present (05/29/2022)   [employer] of Occupational Health - Occupational Stress Questionnaire    Feeling of Stress : Not at all  Social Connections: Socially Integrated (05/29/2022)   Social Connection and Isolation Panel [NHANES]    Frequency of Communication with Friends and Family: More than three times a week    Frequency of Social Gatherings with Friends and Family: More than three times a week    Attends Religious Services: More than 4 times per year    Active Member of Clubs or Organizations: Yes    Attends  Banker Meetings: More than 4 times per year    Marital Status: Married  Catering manager Violence: Not At Risk (05/29/2022)   Humiliation, Afraid, Rape, and Kick questionnaire    Fear of Current or Ex-Partner: No    Emotionally Abused: No    Physically Abused: No    Sexually Abused: No    Past Surgical History:  Procedure Laterality Date   CARDIAC CATHETERIZATION N/A 11/22/2015   Procedure: Right/Left Heart Cath and Coronary Angiography;  Surgeon: Antonieta Iba, MD;  Location: [facility] INVASIVE CV LAB;  Service: Cardiovascular;  Laterality: N/A;   CARDIAC CATHETERIZATION Left 11/29/2015   Procedure: LEFT FEMORAL ARTERIAL LINE INSERTION;  Surgeon: Alleen Borne, MD;  Location: [facility] OR;  Service: Open Heart Surgery;  Laterality: Left;   COLONOSCOPY WITH PROPOFOL N/A 02/14/2019   Procedure: COLONOSCOPY WITH PROPOFOL;  Surgeon: Toledo, Boykin Nearing, MD;  Location: [facility] ENDOSCOPY;  Service: Gastroenterology;  Laterality: N/A;   CORONARY ARTERY BYPASS GRAFT N/A 11/29/2015   Procedure: CORONARY ARTERY BYPASS GRAFTING (CABG) x four, using left internal mammary artery and right leg saphenous vein harvested endoscopically;  Surgeon: Alleen Borne, MD;  Location: [facility] OR;  Service: Open Heart Surgery;  Laterality: N/A;   TEE WITHOUT CARDIOVERSION N/A 11/29/2015   Procedure: TRANSESOPHAGEAL ECHOCARDIOGRAM (TEE);  Surgeon: Alleen Borne, MD;  [facility];  Service: Open Heart Surgery;  Laterality: N/A;   TURP VAPORIZATION      Family History  Problem Relation Age of Onset   Hypertension Mother    Healthy Father     No Known Allergies  Current Outpatient Medications on File Prior to Visit  Medication Sig Dispense Refill   aspirin EC (ASPIRIN LOW DOSE) 81 MG tablet TAKE 1 TABLET(81 MG) BY MOUTH DAILY. SWALLOW WHOLE 90 tablet 3   carvedilol (COREG) 3.125 MG tablet TAKE 1 TABLET(3.125 MG) BY MOUTH TWICE DAILY WITH A MEAL 180 tablet 3   ezetimibe (ZETIA) 10 MG tablet TAKE 1 TABLET(10 MG) BY  MOUTH DAILY 90 tablet 3   finasteride (PROSCAR) 5 MG tablet TAKE 1 TABLET BY MOUTH EVERY DAY FOR URINE FLOW 90 tablet 0   losartan (COZAAR) 25 MG tablet TAKE 1 TABLET BY MOUTH EVERY DAY FOR BLOOD PRESSURE 30 tablet 0   rosuvastatin (CRESTOR) 40 MG tablet TAKE 1 TABLET(40 MG) BY MOUTH DAILY FOR CHOLESTEROL 90 tablet 0   spironolactone (ALDACTONE) 25 MG tablet TAKE 1/2 TABLET(12.5 MG) BY MOUTH DAILY 45 tablet 3   No current facility-administered medications on file prior to visit.    BP (!) 100/58   Pulse 70   Temp 98 F (36.7 C) (Temporal)   Ht 6' (1.829 m)   Wt 204 lb (92.5 kg)   SpO2 98%   BMI 27.67 kg/m  Objective:   Physical  Exam HENT:     Right Ear: Tympanic membrane and ear canal normal.     Left Ear: Tympanic membrane and ear canal normal.     Nose: Nose normal.     Right Sinus: No maxillary sinus tenderness or frontal sinus tenderness.     Left Sinus: No maxillary sinus tenderness or frontal sinus tenderness.  Eyes:     Conjunctiva/sclera: Conjunctivae normal.  Neck:     Thyroid: No thyromegaly.     Vascular: No carotid bruit.  Cardiovascular:     Rate and Rhythm: Normal rate and regular rhythm.     Heart sounds: Normal heart sounds.  Pulmonary:     Effort: Pulmonary effort is normal.     Breath sounds: Normal breath sounds. No wheezing or rales.  Abdominal:     General: Bowel sounds are normal.     Palpations: Abdomen is soft.     Tenderness: There is no abdominal tenderness.  Musculoskeletal:        General: Normal range of motion.     Cervical back: Neck supple.  Skin:    General: Skin is warm and dry.  Neurological:     Mental Status: He is alert and oriented to person, place, and time.     Cranial Nerves: No cranial nerve deficit.     Deep Tendon Reflexes: Reflexes are normal and symmetric.  Psychiatric:        Mood and Affect: Mood normal.           Assessment & Plan:  Preventative health care Assessment & Plan: Immunizations  UTD. Colonoscopy UTD, no further imaging needed PSA UTD  Discussed the importance of a healthy diet and regular exercise in order for weight loss, and to reduce the risk of further co-morbidity.  Exam stable. Labs pending.  Follow up in 1 year for repeat physical.    History of prostate cancer Assessment & Plan: Following with Dr. Annabell Howells per Urology.  PSA from January 2024 reviewed.   Pure hypercholesterolemia Assessment & Plan: Reviewed lipid panel from January 2024. Continue Zetia 10 mg daily, rosuvastatin 40 mg daily.   Prediabetes Assessment & Plan: Repeat A1C pending.  Discussed the importance of a healthy diet and regular exercise in order for weight loss, and to reduce the risk of further co-morbidity.   Orders: -     Hemoglobin A1c -     Basic metabolic panel  Bladder outlet obstruction Assessment & Plan: Controlled. Following with Urology.  Continue finasteride 5 mg daily. PSA reviewed from January 2024   Coronary artery disease involving native coronary artery of native heart without angina pectoris Assessment & Plan: Asymptomatic.  Following with cardiology, office notes reviewed from January 2024.   Chronic systolic congestive heart failure Assessment & Plan: Appears euvolemic.  Continue carvedilol 3.125 mg BID, spironolactone 12.5 mg daily, losartan 25 mg daily, BP control.         Doreene Nest, NP

## 2022-06-16 ENCOUNTER — Ambulatory Visit (HOSPITAL_COMMUNITY)
Admission: RE | Admit: 2022-06-16 | Discharge: 2022-06-16 | Disposition: A | Discharge status: Home / Self Care | Payer: Medicare HMO | Source: Ambulatory Visit | Attending: Urology | Admitting: Urology

## 2022-06-16 ENCOUNTER — Encounter: Payer: Self-pay | Admitting: Radiation Oncology

## 2022-06-16 DIAGNOSIS — C61 Malignant neoplasm of prostate: Secondary | ICD-10-CM | POA: Insufficient documentation

## 2022-06-16 MED ORDER — PIFLIFOLASTAT F 18 (PYLARIFY) INJECTION
9.0000 | Freq: Once | INTRAVENOUS | Status: AC
  Administered 2022-06-16: 9.2 via INTRAVENOUS

## 2022-06-16 NOTE — Progress Notes (Signed)
Radiation Oncology         [phone] ________________________________  Initial Outpatient Consultation  Name: Shane Kim MRN: [id number]  Date: 06/17/2022  DOB: 09/18/1945  WU:[id number], Keane Scrape, NP  Bjorn Pippin, MD   REFERRING PHYSICIAN: Bjorn Pippin, MD  DIAGNOSIS: 77 y.o. gentleman with Stage T1c adenocarcinoma of the prostate with Gleason score of 4+4, and PSA of 14.8 (adjusted for finasteride).    ICD-10-CM   1. Malignant neoplasm of prostate  C61       HISTORY OF PRESENT ILLNESS: Shane Kim is a 77 y.o. male with a diagnosis of prostate cancer. He has a history of BPH with BOO treated with laser TUR and prostate cancer previously treated with focal cryoablation with Dr. Assunta Gambles in 2011. He was noted to have a rising post-treatment PSA at 4.2 in 2016 and was started on finasteride at that time. His PSA decreased to 1.73 at it's lowest in 2018 but had increased to 2.33 in 2021, 2.77 in 2022 and up to 6.11 (12.2 adjusted for finasteride) on labs with his PCP, Vernona Rieger, NP in 05/2021.  Accordingly, he was referred for evaluation in urology by Dr. Annabell Howells on 07/08/21,  digital rectal examination performed at that time showed asymmetry but no discrete nodules. A PSMA PET for disease restaging was performed 08/06/21 and demonstrated intense activity in the prostate, consistent with local recurrence but no evidence of osseous or visceral metastatic disease.  There was a 6mm LUL pulmonary nodule that was not tracer avid. His PSA decreased slightly, to 5.64 (11.2 adjusted for finasteride) when repeated in 08/2021. It was recommended that the patient proceed with repeat TRUSPBx for confirmation of the recurrent disease but patient decline biopsy or consideration for further treatment at that time. His PSA increased to 7.35 (14.8 adjusted for finasteride) in 02/2022. At that time, the patient elected to proceed with transrectal ultrasound with 12 biopsies of the prostate which was  performed on 05/09/22.  The prostate volume measured 21 cc.  Out of 12 core biopsies, 11 were positive.  The maximum Gleason score was 4+4, and this was seen in the left base, left mid, left mid lateral and left apex lateral. Additionally, Gleason 4+3 was seen in the left apex and all 6/6 cores on the right.  He had a repeat PSMA PET on 06/16/22 and this again showed intense uptake in the prostate but no evidence of metastatic disease. The LUL pulmonary nodule appeared unchanged and thus, favored benign.  The patient reviewed the biopsy and imaging results with his urologist and he has kindly been referred today for discussion of potential radiation treatment options.   PREVIOUS RADIATION THERAPY: No  PAST MEDICAL HISTORY:  Past Medical History:  Diagnosis Date   Acute systolic CHF (congestive heart failure)    a. echo 11/21/15: EF <20%, mildly dilated LV, severe diffuse global HK, GR3DD, mod MR, LA mildly dilated, RV sys fxn mildly reduced, PASP 52 mmHg   Anemia    Bladder outlet obstruction 07/22/2013   CAD (coronary artery disease)    a. cardiac cath 11/22/15: dLM 40%, pLAD 80-90%, mLAD sequential 40%, D2 90%, mLCx 90% at takeoff of OM1, RCA appeared occluded proximally, LVEF 20%, PCWP 29    Chickenpox    Chronic diastolic CHF (congestive heart failure)    ED (erectile dysfunction)    Elevated PSA    History of UTI    HLD (hyperlipidemia)    Hypertension    Pre-diabetes    Prostate cancer  Urinary retention 10/16/2021   Urinary urgency    Weak urine stream       PAST SURGICAL HISTORY: Past Surgical History:  Procedure Laterality Date   CARDIAC CATHETERIZATION N/A 11/22/2015   Procedure: Right/Left Heart Cath and Coronary Angiography;  Surgeon: Antonieta Iba, MD;  Location: [facility] INVASIVE CV LAB;  Service: Cardiovascular;  Laterality: N/A;   CARDIAC CATHETERIZATION Left 11/29/2015   Procedure: LEFT FEMORAL ARTERIAL LINE INSERTION;  Surgeon: Alleen Borne, MD;  Location: [facility]  OR;  Service: Open Heart Surgery;  Laterality: Left;   COLONOSCOPY WITH PROPOFOL N/A 02/14/2019   Procedure: COLONOSCOPY WITH PROPOFOL;  Surgeon: Toledo, Boykin Nearing, MD;  Location: [facility] ENDOSCOPY;  Service: Gastroenterology;  Laterality: N/A;   CORONARY ARTERY BYPASS GRAFT N/A 11/29/2015   Procedure: CORONARY ARTERY BYPASS GRAFTING (CABG) x four, using left internal mammary artery and right leg saphenous vein harvested endoscopically;  Surgeon: Alleen Borne, MD;  Location: [facility] OR;  Service: Open Heart Surgery;  Laterality: N/A;   PROSTATE BIOPSY     TEE WITHOUT CARDIOVERSION N/A 11/29/2015   Procedure: TRANSESOPHAGEAL ECHOCARDIOGRAM (TEE);  Surgeon: Alleen Borne, MD;  Location: [facility] OR;  Service: Open Heart Surgery;  Laterality: N/A;   TURP VAPORIZATION      FAMILY HISTORY:  Family History  Problem Relation Age of Onset   Hypertension Mother    Healthy Father     SOCIAL HISTORY:  Social History   Socioeconomic History   Marital status: Married    Spouse name: BRENDA   Number of children: 2   Years of education: Not on file   Highest education level: Not on file  Occupational History   Not on file  Tobacco Use   Smoking status: Never   Smokeless tobacco: Never  Vaping Use   Vaping Use: Never used  Substance and Sexual Activity   Alcohol use: Never   Drug use: Never   Sexual activity: Not Currently  Other Topics Concern   Not on file  Social History Narrative   Married. Lives with his wife.   2 children, 2 grandchildren.   Retired. Worked as a Chartered certified accountant.   Enjoys fishing.    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   Social Determinants of Health   Financial Resource Strain: Low Risk  (05/29/2022)   Overall Financial Resource Strain (CARDIA)    Difficulty of Paying Living Expenses: Not hard at all  Food Insecurity: No Food Insecurity (05/29/2022)   Hunger Vital Sign    Worried About Running Out of Food in the Last Year: Never true    Ran Out of Food in the  Last Year: Never true  Transportation Needs: No Transportation Needs (05/29/2022)   PRAPARE - Administrator, Civil Service (Medical): No    Lack of Transportation (Non-Medical): No  Physical Activity: Sufficiently Active (05/29/2022)   Exercise Vital Sign    Days of Exercise per Week: 5 days    Minutes of Exercise per Session: 30 min  Stress: No Stress Concern Present (05/29/2022)   [employer] of Occupational Health - Occupational Stress Questionnaire    Feeling of Stress : Not at all  Social Connections: Socially Integrated (05/29/2022)   Social Connection and Isolation Panel [NHANES]    Frequency of Communication with Friends and Family: More than three times a week    Frequency of Social Gatherings with Friends and Family: More than three times a week    Attends Religious Services: More than 4  times per year    Active Member of Clubs or Organizations: Yes    Attends Banker Meetings: More than 4 times per year    Marital Status: Married  Catering manager Violence: Not At Risk (05/29/2022)   Humiliation, Afraid, Rape, and Kick questionnaire    Fear of Current or Ex-Partner: No    Emotionally Abused: No    Physically Abused: No    Sexually Abused: No    ALLERGIES: Patient has no known allergies.  MEDICATIONS:  Current Outpatient Medications  Medication Sig Dispense Refill   aspirin EC (ASPIRIN LOW DOSE) 81 MG tablet TAKE 1 TABLET(81 MG) BY MOUTH DAILY. SWALLOW WHOLE 90 tablet 3   carvedilol (COREG) 3.125 MG tablet TAKE 1 TABLET(3.125 MG) BY MOUTH TWICE DAILY WITH A MEAL 180 tablet 3   ezetimibe (ZETIA) 10 MG tablet TAKE 1 TABLET(10 MG) BY MOUTH DAILY 90 tablet 3   finasteride (PROSCAR) 5 MG tablet TAKE 1 TABLET BY MOUTH EVERY DAY FOR URINE FLOW 90 tablet 0   losartan (COZAAR) 25 MG tablet TAKE 1 TABLET BY MOUTH EVERY DAY FOR BLOOD PRESSURE 30 tablet 0   rosuvastatin (CRESTOR) 40 MG tablet TAKE 1 TABLET(40 MG) BY MOUTH DAILY FOR CHOLESTEROL 90 tablet 0    spironolactone (ALDACTONE) 25 MG tablet TAKE 1/2 TABLET(12.5 MG) BY MOUTH DAILY 45 tablet 3   No current facility-administered medications for this visit.    REVIEW OF SYSTEMS:  On review of systems, the patient reports that he is doing well overall. He denies any chest pain, shortness of breath, cough, fevers, chills, night sweats, unintended weight changes. He denies any bowel disturbances, and denies abdominal pain, nausea or vomiting. He denies any new musculoskeletal or joint aches or pains. His IPSS was ***, indicating *** urinary symptoms. His SHIM was ***, indicating he {does not have/has mild/moderate/severe} erectile dysfunction. A complete review of systems is obtained and is otherwise negative.    PHYSICAL EXAM:  Wt Readings from Last 3 Encounters:  06/13/22 204 lb (92.5 kg)  03/26/22 210 lb 3.2 oz (95.3 kg)  10/16/21 211 lb 6 oz (95.9 kg)   Temp Readings from Last 3 Encounters:  06/13/22 98 F (36.7 C) (Temporal)  10/16/21 (!) 96.5 F (35.8 C)  10/02/21 98.3 F (36.8 C)   BP Readings from Last 3 Encounters:  06/13/22 (!) 100/58  03/26/22 (!) 118/56  10/16/21 130/62   Pulse Readings from Last 3 Encounters:  06/13/22 70  03/26/22 70  10/16/21 66    /10  In general this is a well appearing *** male in no acute distress. He's alert and oriented x4 and appropriate throughout the examination. Cardiopulmonary assessment is negative for acute distress, and he exhibits normal effort.     KPS = ***  100 - Normal; no complaints; no evidence of disease. 90   - Able to carry on normal activity; minor signs or symptoms of disease. 80   - Normal activity with effort; some signs or symptoms of disease. 91   - Cares for self; unable to carry on normal activity or to do active work. 60   - Requires occasional assistance, but is able to care for most of his personal needs. 50   - Requires considerable assistance and frequent medical care. 40   - Disabled; requires special care  and assistance. 30   - Severely disabled; hospital admission is indicated although death not imminent. 20   - Very sick; hospital admission necessary; active supportive treatment  necessary. 10   - Moribund; fatal processes progressing rapidly. 0     - Dead  Karnofsky DA, Abelmann WH, Craver LS and Burchenal [facility] [phone]) The use of the nitrogen mustards in the palliative treatment of carcinoma: with particular reference to bronchogenic carcinoma Cancer 1 634-56  LABORATORY DATA:  Lab Results  Component Value Date   WBC 2.6 (L) 03/26/2022   HGB 12.2 (L) 03/26/2022   HCT 37.4 (L) 03/26/2022   MCV 96 03/26/2022   PLT 145 (L) 03/26/2022   Lab Results  Component Value Date   NA 140 06/13/2022   K 5.2 No hemolysis seen (H) 06/13/2022   CL 107 06/13/2022   CO2 28 06/13/2022   Lab Results  Component Value Date   ALT 14 03/26/2022   AST 27 03/26/2022   ALKPHOS 59 03/26/2022   BILITOT 0.6 03/26/2022     RADIOGRAPHY: NM PET (PSMA) SKULL TO MID THIGH  Result Date: 06/16/2022 CLINICAL DATA:  Prostate cancer with prior cryotherapy. Biochemical recurrence. PSA of 7.4. EXAM: NUCLEAR MEDICINE PET SKULL BASE TO THIGH TECHNIQUE: 9.2 mCi F18 Piflufolastat (Pylarify) was injected intravenously. Full-ring PET imaging was performed from the skull base to thigh after the radiotracer. CT data was obtained and used for attenuation correction and anatomic localization. COMPARISON:  08/06/2021 FINDINGS: NECK No radiotracer activity in neck lymph nodes. Incidental CT finding: No cervical adenopathy. Bilateral carotid atherosclerosis. CHEST No tracer avidity within thoracic nodes or pulmonary nodules. Incidental CT finding: Median sternotomy for CABG. Mild cardiomegaly. Aortic atherosclerosis. The vague 7 mm left upper lobe pulmonary nodule is similar in 96/4. Below PET resolution. ABDOMEN/PELVIS Prostate: Multifocal prostate affinity again identified. Within the base, right-sided tracer affinity measures a S.U.V.  max of 11.0 today versus a S.U.V. max of 14.3 on the prior. This is contiguous with eccentric right but bilateral apical avidity at a S.U.V. max of 12.3 today versus a S.U.V. max of 12.2 on the prior. Lymph nodes: No abnormal radiotracer accumulation within pelvic or abdominal nodes. Again identified is celiac ganglia physiologic tracer uptake including on 141/4 bilaterally. Liver: No evidence of liver metastasis. Incidental CT finding: Normal adrenal glands. Upper pole right renal 4.7 cm fluid density lesion is likely a cyst . In the absence of clinically indicated signs/symptoms require(s) no independent follow-up. Abdominal aortic atherosclerosis. Colonic stool burden suggests constipation. SKELETON No focal activity to suggest skeletal metastasis. IMPRESSION: 1. Multifocal prostatic tracer affinity, relatively similar. Consistent with residual disease. 2. No evidence of nodal, distant, or osseous tracer avid metastasis. 3. Left upper lobe 7 mm pulmonary nodule is below PET resolution, but unchanged and can be presumed benign. 4. Incidental findings, including: Aortic Atherosclerosis (ICD10-I70.0). Possible constipation. Electronically Signed   By: Jeronimo Greaves M.D.   On: 06/16/2022 16:03      IMPRESSION/PLAN: 1. 78 y.o. gentleman with Stage T1c adenocarcinoma of the prostate with Gleason Score of 4+4, and PSA of 14.8 (adjusted for finasteride). We discussed the patient's workup and outlined the nature of prostate cancer in this setting. The patient's T stage, Gleason's score, and PSA put him into the high risk group. Accordingly, he is eligible for a variety of potential treatment options including Prostatectomy or LT-ADT in combination with either 8 weeks of external radiation or 5 weeks of external radiation with an upfront brachytherapy boost. He is not felt to be a good candidate for surgery given his medical comorbidities and history of prior treatments. We discussed the available radiation techniques,  and focused on the  details and logistics of delivery. He is also not felt to be an ideal candidate for brachytherapy boost with a prostate volume of 21 cc and prior history of laser TURP and cryoablation of the prostate. Therefore, we discussed and outlined the risks, benefits, short and long-term effects associated with daily external beam radiotherapy and compared and contrasted these with prostatectomy. We discussed the role of SpaceOAR gel in reducing the rectal toxicity associated with radiotherapy. We also detailed the role of ADT in the treatment of high risk prostate cancer and outlined the associated side effects that could be expected with this therapy. He appears to have a good understanding of his disease and our treatment recommendations which are of curative intent.  He was encouraged to ask questions that were answered to his stated satisfaction.  At the conclusion of our conversation, the patient is interested in moving forward with ***.  We personally spent *** minutes in this encounter including chart review, reviewing radiological studies, meeting face-to-face with the patient, entering orders and completing documentation.   ------------------------------------------------   Margaretmary Dys, MD [facility]  Radiation Oncology Direct Dial: [phone]  Fax: [phone] [email or web address]  Skype  LinkedIn

## 2022-06-17 ENCOUNTER — Encounter: Payer: Self-pay | Admitting: Radiation Oncology

## 2022-06-17 ENCOUNTER — Ambulatory Visit
Admission: RE | Admit: 2022-06-17 | Discharge: 2022-06-17 | Disposition: A | Discharge status: Home / Self Care | Payer: Medicare HMO | Source: Ambulatory Visit | Attending: Radiation Oncology | Admitting: Radiation Oncology

## 2022-06-17 ENCOUNTER — Other Ambulatory Visit: Payer: Self-pay

## 2022-06-17 ENCOUNTER — Other Ambulatory Visit: Payer: Self-pay | Admitting: Urology

## 2022-06-17 ENCOUNTER — Telehealth: Payer: Self-pay | Admitting: *Deleted

## 2022-06-17 VITALS — Ht 74.0 in | Wt 204.0 lb

## 2022-06-17 DIAGNOSIS — C61 Malignant neoplasm of prostate: Secondary | ICD-10-CM

## 2022-06-17 DIAGNOSIS — Z191 Hormone sensitive malignancy status: Secondary | ICD-10-CM | POA: Diagnosis not present

## 2022-06-17 HISTORY — DX: Elevated prostate specific antigen (PSA): R97.20

## 2022-06-17 HISTORY — DX: Poor urinary stream: R39.12

## 2022-06-17 HISTORY — DX: Male erectile dysfunction, unspecified: N52.9

## 2022-06-17 HISTORY — DX: Urgency of urination: R39.15

## 2022-06-17 HISTORY — DX: Personal history of urinary (tract) infections: Z87.440

## 2022-06-17 NOTE — Addendum Note (Signed)
Encounter addended by: Margaretmary Dys, MD on: 06/17/2022 9:31 AM  Actions taken: Problem List reviewed, Medication List reviewed, Allergies reviewed

## 2022-06-17 NOTE — Telephone Encounter (Signed)
CALLED PATIENT TO INFORM OF ADT APPT. WITH DR. Annabell Howells ON 06-23-22- ARRIVAL TIME- 10 AM, LVM FOR A RETURN CALL

## 2022-06-23 DIAGNOSIS — C61 Malignant neoplasm of prostate: Secondary | ICD-10-CM | POA: Diagnosis not present

## 2022-06-24 ENCOUNTER — Institutional Professional Consult (permissible substitution): Payer: Medicare HMO | Admitting: Radiation Oncology

## 2022-06-26 ENCOUNTER — Ambulatory Visit
Admission: RE | Admit: 2022-06-26 | Discharge: 2022-06-26 | Disposition: A | Discharge status: Home / Self Care | Payer: Medicare HMO | Source: Ambulatory Visit | Attending: Radiation Oncology | Admitting: Radiation Oncology

## 2022-06-26 ENCOUNTER — Other Ambulatory Visit: Payer: Self-pay | Admitting: *Deleted

## 2022-06-26 ENCOUNTER — Encounter: Payer: Self-pay | Admitting: Radiation Oncology

## 2022-06-26 ENCOUNTER — Other Ambulatory Visit: Payer: Self-pay | Admitting: Urology

## 2022-06-26 VITALS — BP 101/63 | HR 82 | Temp 98.3°F | Resp 12 | Wt 202.0 lb

## 2022-06-26 DIAGNOSIS — R351 Nocturia: Secondary | ICD-10-CM | POA: Diagnosis not present

## 2022-06-26 DIAGNOSIS — I2510 Atherosclerotic heart disease of native coronary artery without angina pectoris: Secondary | ICD-10-CM | POA: Insufficient documentation

## 2022-06-26 DIAGNOSIS — C61 Malignant neoplasm of prostate: Secondary | ICD-10-CM | POA: Diagnosis not present

## 2022-06-26 DIAGNOSIS — Z7982 Long term (current) use of aspirin: Secondary | ICD-10-CM | POA: Insufficient documentation

## 2022-06-26 DIAGNOSIS — I5042 Chronic combined systolic (congestive) and diastolic (congestive) heart failure: Secondary | ICD-10-CM | POA: Insufficient documentation

## 2022-06-26 DIAGNOSIS — I110 Hypertensive heart disease with heart failure: Secondary | ICD-10-CM | POA: Diagnosis not present

## 2022-06-26 DIAGNOSIS — R911 Solitary pulmonary nodule: Secondary | ICD-10-CM | POA: Insufficient documentation

## 2022-06-26 DIAGNOSIS — Z8546 Personal history of malignant neoplasm of prostate: Secondary | ICD-10-CM | POA: Insufficient documentation

## 2022-06-26 DIAGNOSIS — Z87440 Personal history of urinary (tract) infections: Secondary | ICD-10-CM | POA: Insufficient documentation

## 2022-06-26 DIAGNOSIS — Z79899 Other long term (current) drug therapy: Secondary | ICD-10-CM | POA: Insufficient documentation

## 2022-06-26 DIAGNOSIS — Z191 Hormone sensitive malignancy status: Secondary | ICD-10-CM | POA: Diagnosis not present

## 2022-06-26 DIAGNOSIS — E785 Hyperlipidemia, unspecified: Secondary | ICD-10-CM | POA: Diagnosis not present

## 2022-06-26 DIAGNOSIS — Z923 Personal history of irradiation: Secondary | ICD-10-CM | POA: Diagnosis not present

## 2022-06-26 NOTE — Consult Note (Signed)
NEW PATIENT EVALUATION  Name: Shane Kim  MRN: [id number]  Date:   06/26/2022     DOB: November 28, 1945   This 77 y.o. male patient presents to the clinic for initial evaluation of Gleason 8 (4+4) in patient status post laser TUR as well as cryoablation back in 2011 for adenocarcinoma the prostate.  REFERRING PHYSICIAN: Doreene Nest, NP  CHIEF COMPLAINT:  Chief Complaint  Patient presents with   Prostate Cancer    DIAGNOSIS: The encounter diagnosis was Malignant neoplasm of prostate (HCC).   PREVIOUS INVESTIGATIONS:  PSMA PET scan reviewed Pathology reports reviewed Clinical notes reviewed  HPI: Patient is a 77 year old male whose history dates back to 2011 when he received focal cryoablation by Dr. Assunta Gambles in [place] for adenocarcinoma of the prostate.  His PSA has climbed since.  He also has a history of laser assisted TUR.  He was seen by Dr. Annabell Howells in [place] back in May 2023.  PSMA PET scan on 623 showed intense activity in prostate with no evidence of osseous or visceral metastatic disease.  His PSA climbed to 7.3 in January 24.  He underwent transrectal ultrasound-guided biopsy in March showing 11 of 12 cores positive for mixture of Gleason 8 (4+4) Gleason 7 (4+3).  Recent PSMA PET scan showed again only uptake in the prostate no evidence of pelvic nodes involved or any visceral or osseous metastatic disease.  He does have a left upper lobe pulmonary nodule which over time has remained unchanged favored to be benign.  He has been evaluated by radiation collagen in [place] with recommendation for external beam treatment.  He is seen today for opinion.  He is doing well he has nocturia x 3.  No GI complaints no bone pain.  PLANNED TREATMENT REGIMEN: Image guided IMRT treatment  PAST MEDICAL HISTORY:  has a past medical history of Acute systolic CHF (congestive heart failure) (HCC), Anemia, Bladder outlet obstruction (07/22/2013), CAD (coronary artery disease),  Chickenpox, Chronic diastolic CHF (congestive heart failure) (HCC), ED (erectile dysfunction), Elevated PSA, History of UTI, HLD (hyperlipidemia), Hypertension, Pre-diabetes, Prostate cancer (HCC), Urinary retention (10/16/2021), Urinary urgency, and Weak urine stream.    PAST SURGICAL HISTORY:  Past Surgical History:  Procedure Laterality Date   CARDIAC CATHETERIZATION N/A 11/22/2015   Procedure: Right/Left Heart Cath and Coronary Angiography;  Surgeon: Antonieta Iba, MD;  Location: [facility] INVASIVE CV LAB;  Service: Cardiovascular;  Laterality: N/A;   CARDIAC CATHETERIZATION Left 11/29/2015   Procedure: LEFT FEMORAL ARTERIAL LINE INSERTION;  Surgeon: Alleen Borne, MD;  Location: [facility] OR;  Service: Open Heart Surgery;  Laterality: Left;   COLONOSCOPY WITH PROPOFOL N/A 02/14/2019   Procedure: COLONOSCOPY WITH PROPOFOL;  Surgeon: Toledo, Boykin Nearing, MD;  Location: [facility] ENDOSCOPY;  Service: Gastroenterology;  Laterality: N/A;   CORONARY ARTERY BYPASS GRAFT N/A 11/29/2015   Procedure: CORONARY ARTERY BYPASS GRAFTING (CABG) x four, using left internal mammary artery and right leg saphenous vein harvested endoscopically;  Surgeon: Alleen Borne, MD;  Location: [facility] OR;  Service: Open Heart Surgery;  Laterality: N/A;   PROSTATE BIOPSY     TEE WITHOUT CARDIOVERSION N/A 11/29/2015   Procedure: TRANSESOPHAGEAL ECHOCARDIOGRAM (TEE);  Surgeon: Alleen Borne, MD;  Location: [facility] OR;  Service: Open Heart Surgery;  Laterality: N/A;   TURP VAPORIZATION      FAMILY HISTORY: family history includes Healthy in his father; Hypertension in his mother.  SOCIAL HISTORY:  reports that he has never smoked. He has never used smokeless tobacco. He  reports that he does not drink alcohol and does not use drugs.  ALLERGIES: Patient has no known allergies.  MEDICATIONS:  Current Outpatient Medications  Medication Sig Dispense Refill   aspirin EC (ASPIRIN LOW DOSE) 81 MG tablet TAKE 1 TABLET(81 MG) BY MOUTH DAILY. SWALLOW  WHOLE 90 tablet 3   carvedilol (COREG) 3.125 MG tablet TAKE 1 TABLET(3.125 MG) BY MOUTH TWICE DAILY WITH A MEAL 180 tablet 3   ezetimibe (ZETIA) 10 MG tablet TAKE 1 TABLET(10 MG) BY MOUTH DAILY 90 tablet 3   finasteride (PROSCAR) 5 MG tablet TAKE 1 TABLET BY MOUTH EVERY DAY FOR URINE FLOW 90 tablet 0   losartan (COZAAR) 25 MG tablet TAKE 1 TABLET BY MOUTH EVERY DAY FOR BLOOD PRESSURE 30 tablet 0   rosuvastatin (CRESTOR) 40 MG tablet TAKE 1 TABLET(40 MG) BY MOUTH DAILY FOR CHOLESTEROL 90 tablet 0   spironolactone (ALDACTONE) 25 MG tablet TAKE 1/2 TABLET(12.5 MG) BY MOUTH DAILY 45 tablet 3   No current facility-administered medications for this encounter.    ECOG PERFORMANCE STATUS:  0 - Asymptomatic  REVIEW OF SYSTEMS: Patient does have a history of bladder outlet obstruction coronary artery disease congestive heart failure and prediabetes Patient denies any weight loss, fatigue, weakness, fever, chills or night sweats. Patient denies any loss of vision, blurred vision. Patient denies any ringing  of the ears or hearing loss. No irregular heartbeat. Patient denies heart murmur or history of fainting. Patient denies any chest pain or pain radiating to her upper extremities. Patient denies any shortness of breath, difficulty breathing at night, cough or hemoptysis. Patient denies any swelling in the lower legs. Patient denies any nausea vomiting, vomiting of blood, or coffee ground material in the vomitus. Patient denies any stomach pain. Patient states has had normal bowel movements no significant constipation or diarrhea. Patient denies any dysuria, hematuria or significant nocturia. Patient denies any problems walking, swelling in the joints or loss of balance. Patient denies any skin changes, loss of hair or loss of weight. Patient denies any excessive worrying or anxiety or significant depression. Patient denies any problems with insomnia. Patient denies excessive thirst, polyuria, polydipsia.  Patient denies any swollen glands, patient denies easy bruising or easy bleeding. Patient denies any recent infections, allergies or URI. Patient "s visual fields have not changed significantly in recent time.   PHYSICAL EXAM: BP 101/63   Pulse 82   Temp 98.3 F (36.8 C) (Tympanic)   Resp 12   Wt 202 lb (91.6 kg)   BMI 25.94 kg/m  Well-developed well-nourished patient in NAD. HEENT reveals PERLA, EOMI, discs not visualized.  Oral cavity is clear. No oral mucosal lesions are identified. Neck is clear without evidence of cervical or supraclavicular adenopathy. Lungs are clear to A&P. Cardiac examination is essentially unremarkable with regular rate and rhythm without murmur rub or thrill. Abdomen is benign with no organomegaly or masses noted. Motor sensory and DTR levels are equal and symmetric in the upper and lower extremities. Cranial nerves II through XII are grossly intact. Proprioception is intact. No peripheral adenopathy or edema is identified. No motor or sensory levels are noted. Crude visual fields are within normal range.  LABORATORY DATA: Pathology reports reviewed    RADIOLOGY RESULTS: PSMA PET scans reviewed compatible with above-stated findings   IMPRESSION: Gleason 8 adenocarcinoma the prostate is an 77 year old male with a PSA in the 7 range.  PLAN: This time I run the [facility] nomogramWhich crudely predicts approximate 30% chance of pelvic  lymph node involvement.  Based on the negative PET scan showing no evidence of gross adenopathy in the pelvis would concentrate on his prostate gland based on his age and overall general tumor characteristics and plan on delivering 23 Gray of image guided IMRT treatment to his prostate and seminal vesicle region.  I have asked Dr. Annabell Howells to place fiducial markers in his prostate for daily image guided treatment.  Patient believes he did receive ADT therapy through Dr. Jeralyn Ruths office we will check on that and if not we will  institute it.  Risks and benefits of radiation including increased lower Neri tract symptoms possibility of diarrhea fatigue alteration blood counts all were reviewed in detail with the patient.  We will set up simulation after his markers are placed.  Patient comprehends my recommendation well.  I would like to take this opportunity to thank you for allowing me to participate in the care of your patient.Carmina Miller, MD

## 2022-07-01 ENCOUNTER — Telehealth: Payer: Self-pay

## 2022-07-01 DIAGNOSIS — I420 Dilated cardiomyopathy: Secondary | ICD-10-CM

## 2022-07-01 MED ORDER — LOSARTAN POTASSIUM 25 MG PO TABS
25.0000 mg | ORAL_TABLET | Freq: Every day | ORAL | 2 refills | Status: DC
Start: 2022-07-01 — End: 2023-03-30

## 2022-07-01 NOTE — Telephone Encounter (Signed)
Received refill request for Losartan 25mg  tablets

## 2022-07-01 NOTE — Telephone Encounter (Signed)
Refills sent to pharmacy. 

## 2022-07-03 DIAGNOSIS — Z0100 Encounter for examination of eyes and vision without abnormal findings: Secondary | ICD-10-CM | POA: Diagnosis not present

## 2022-07-03 DIAGNOSIS — H5213 Myopia, bilateral: Secondary | ICD-10-CM | POA: Diagnosis not present

## 2022-07-15 DIAGNOSIS — C61 Malignant neoplasm of prostate: Secondary | ICD-10-CM | POA: Diagnosis not present

## 2022-07-23 ENCOUNTER — Ambulatory Visit: Payer: Medicare HMO | Admitting: Urology

## 2022-07-23 DIAGNOSIS — C61 Malignant neoplasm of prostate: Secondary | ICD-10-CM | POA: Diagnosis not present

## 2022-08-15 ENCOUNTER — Telehealth: Payer: Self-pay | Admitting: Primary Care

## 2022-08-15 NOTE — Telephone Encounter (Signed)
Patient picked up medication list from from office

## 2022-08-15 NOTE — Telephone Encounter (Signed)
Patient called in and is needing a copy of his medications for an upcoming appointment. They would like a call when this is done. Thank you!

## 2022-08-15 NOTE — Telephone Encounter (Signed)
Patient has been notified.  Medication list placed up front with reception

## 2022-08-18 ENCOUNTER — Encounter (HOSPITAL_BASED_OUTPATIENT_CLINIC_OR_DEPARTMENT_OTHER): Payer: Self-pay | Admitting: Urology

## 2022-08-18 ENCOUNTER — Other Ambulatory Visit: Payer: Self-pay

## 2022-08-18 NOTE — Progress Notes (Addendum)
Spoke w/ via phone for pre-op interview---pt Lab needs dos----   I stat            Lab results------see below COVID test -----patient states asymptomatic no test needed Arrive at -------600 am 09-02-2022 NPO after MN NO Solid Food.  Clear liquids from MN until---500 am Med rec completed Medications to take morning of surgery -----Crestor, Zetia, Carvedilol Diabetic medication -----n/a Patient instructed no nail polish to be worn day of surgery Patient instructed to bring photo id and insurance card day of surgery Patient aware to have Driver (ride ) / caregiver  son Shane Kim  for 24 hours after surgery  Patient Special Instructions -----fleets enema at bedtime night before surgery Pre-Op special Instructions -----none Patient verbalized understanding of instructions that were given at this phone interview. Patient denies shortness of breath, chest pain, fever, cough at this phone interview.  Cardiology lov dr Anne Fu 03-26-2022 Echo 04-29-2018 epic Cardiac cath : 11-22-2015 epic EKG 03-26-2022 epic

## 2022-08-22 ENCOUNTER — Other Ambulatory Visit: Payer: Self-pay | Admitting: Cardiology

## 2022-08-22 DIAGNOSIS — E7800 Pure hypercholesterolemia, unspecified: Secondary | ICD-10-CM

## 2022-08-22 DIAGNOSIS — I5022 Chronic systolic (congestive) heart failure: Secondary | ICD-10-CM

## 2022-08-22 DIAGNOSIS — I2510 Atherosclerotic heart disease of native coronary artery without angina pectoris: Secondary | ICD-10-CM

## 2022-08-22 DIAGNOSIS — Z951 Presence of aortocoronary bypass graft: Secondary | ICD-10-CM

## 2022-08-25 ENCOUNTER — Ambulatory Visit: Payer: Medicare HMO

## 2022-08-26 DIAGNOSIS — Z5111 Encounter for antineoplastic chemotherapy: Secondary | ICD-10-CM | POA: Diagnosis not present

## 2022-08-26 DIAGNOSIS — C61 Malignant neoplasm of prostate: Secondary | ICD-10-CM | POA: Diagnosis not present

## 2022-09-01 NOTE — Anesthesia Preprocedure Evaluation (Signed)
Anesthesia Evaluation  Patient identified by MRN, date of birth, ID band Patient awake    Reviewed: Allergy & Precautions, H&P , NPO status , Patient's Chart, lab work & pertinent test results  History of Anesthesia Complications Negative for: history of anesthetic complications  Airway Mallampati: II  TM Distance: >3 FB Neck ROM: Full    Dental  (+) Chipped, Missing, Dental Advisory Given,    Pulmonary neg pulmonary ROS   Pulmonary exam normal breath sounds clear to auscultation       Cardiovascular Exercise Tolerance: Good hypertension, Pt. on home beta blockers and Pt. on medications (-) angina + CAD, + Past MI, + CABG and +CHF  Normal cardiovascular exam(-) dysrhythmias  Rhythm:Regular Rate:Normal  Echo 2020  1. The left ventricle has mildly reduced systolic function, with an ejection fraction of 45-50%. The cavity size was mildly dilated. There is mildly increased left ventricular wall thickness. Left ventricular diastolic parameters were normal.   2. Mid and basal inferior wall akinesis distal septal hypokinesis.   3. The right ventricle has normal systolic function. The cavity was normal. There is no increase in right ventricular wall thickness.   4. Left atrial size was mildly dilated.   5. The mitral valve is degenerative. Mild thickening of the mitral valve leaflet. Mild calcification of the mitral valve leaflet. There is moderate mitral annular calcification present.   6. The tricuspid valve is normal in structure.   7. The aortic valve is tricuspid Severely thickening of the aortic valve Sclerosis without any evidence of stenosis of the aortic valve. Aortic valve regurgitation is trivial by color flow Doppler.   8. The pulmonic valve was grossly normal. Pulmonic valve regurgitation is mild by color flow Doppler.   9. The aortic root is normal in size and structure.    TEE 2017  Left ventricle: Concentric hypertrophy.  LV systolic function is <20%. Wall motion is abnormal. Global hypokinesis No thrombus present. No mass present.  Septum: No Patent Foramen Ovale present.  Left atrium: Patent foramen ovale not present.  Aortic valve: The valve is trileaflet. No regurgitation. No AV vegetation.  Mitral valve: Mild leaflet thickening is present. Mild leaflet calcification is present. Mild mitral annular calcification. Mild regurgitation.  Right ventricle: Cavity is mildly to moderately dilated. Moderately reduced systolic function. No thrombus present. No mass present.  Tricuspid valve: Trace regurgitation.  Pulmonic valve: Trace regurgitation.      Neuro/Psych negative neurological ROS  negative psych ROS   GI/Hepatic Neg liver ROS,GERD  ,,  Endo/Other  diabetes (borderline)    Renal/GU negative Renal ROS     Musculoskeletal   Abdominal   Peds  Hematology  (+) Blood dyscrasia, anemia   Anesthesia Other Findings   Reproductive/Obstetrics negative OB ROS                             Anesthesia Physical Anesthesia Plan  ASA: 3  Anesthesia Plan: MAC   Post-op Pain Management:    Induction: Intravenous  PONV Risk Score and Plan: 2 and Propofol infusion, TIVA and Treatment may vary due to age or medical condition  Airway Management Planned: Natural Airway and Nasal Cannula  Additional Equipment:   Intra-op Plan:   Post-operative Plan:   Informed Consent: I have reviewed the patients History and Physical, chart, labs and discussed the procedure including the risks, benefits and alternatives for the proposed anesthesia with the patient or authorized representative who has  indicated his/her understanding and acceptance.     Dental advisory given  Plan Discussed with: CRNA  Anesthesia Plan Comments:         Anesthesia Quick Evaluation

## 2022-09-02 ENCOUNTER — Encounter (HOSPITAL_BASED_OUTPATIENT_CLINIC_OR_DEPARTMENT_OTHER)
Admission: RE | Disposition: A | Discharge status: Home / Self Care | Payer: Self-pay | Source: Ambulatory Visit | Attending: Urology

## 2022-09-02 ENCOUNTER — Ambulatory Visit (HOSPITAL_BASED_OUTPATIENT_CLINIC_OR_DEPARTMENT_OTHER)
Admission: RE | Admit: 2022-09-02 | Discharge: 2022-09-02 | Disposition: A | Discharge status: Home / Self Care | Payer: Medicare HMO | Source: Ambulatory Visit | Attending: Urology | Admitting: Urology

## 2022-09-02 ENCOUNTER — Encounter (HOSPITAL_BASED_OUTPATIENT_CLINIC_OR_DEPARTMENT_OTHER): Payer: Self-pay | Admitting: Urology

## 2022-09-02 ENCOUNTER — Ambulatory Visit (HOSPITAL_BASED_OUTPATIENT_CLINIC_OR_DEPARTMENT_OTHER): Payer: Medicare HMO | Admitting: Certified Registered Nurse Anesthetist

## 2022-09-02 DIAGNOSIS — I110 Hypertensive heart disease with heart failure: Secondary | ICD-10-CM | POA: Insufficient documentation

## 2022-09-02 DIAGNOSIS — C61 Malignant neoplasm of prostate: Secondary | ICD-10-CM | POA: Insufficient documentation

## 2022-09-02 DIAGNOSIS — Z01818 Encounter for other preprocedural examination: Secondary | ICD-10-CM

## 2022-09-02 DIAGNOSIS — Z951 Presence of aortocoronary bypass graft: Secondary | ICD-10-CM | POA: Insufficient documentation

## 2022-09-02 DIAGNOSIS — I252 Old myocardial infarction: Secondary | ICD-10-CM

## 2022-09-02 DIAGNOSIS — I2510 Atherosclerotic heart disease of native coronary artery without angina pectoris: Secondary | ICD-10-CM

## 2022-09-02 DIAGNOSIS — E785 Hyperlipidemia, unspecified: Secondary | ICD-10-CM | POA: Insufficient documentation

## 2022-09-02 DIAGNOSIS — K219 Gastro-esophageal reflux disease without esophagitis: Secondary | ICD-10-CM | POA: Diagnosis not present

## 2022-09-02 DIAGNOSIS — I5042 Chronic combined systolic (congestive) and diastolic (congestive) heart failure: Secondary | ICD-10-CM | POA: Diagnosis not present

## 2022-09-02 HISTORY — DX: Presence of spectacles and contact lenses: Z97.3

## 2022-09-02 HISTORY — PX: GOLD SEED IMPLANT: SHX6343

## 2022-09-02 HISTORY — PX: SPACE OAR INSTILLATION: SHX6769

## 2022-09-02 LAB — POCT I-STAT, CHEM 8
BUN: 38 mg/dL — ABNORMAL HIGH (ref 8–23)
Calcium, Ion: 1.54 mmol/L (ref 1.15–1.40)
Chloride: 102 mmol/L (ref 98–111)
Creatinine, Ser: 1.8 mg/dL — ABNORMAL HIGH (ref 0.61–1.24)
Glucose, Bld: 110 mg/dL — ABNORMAL HIGH (ref 70–99)
HCT: 35 % — ABNORMAL LOW (ref 39.0–52.0)
Hemoglobin: 11.9 g/dL — ABNORMAL LOW (ref 13.0–17.0)
Potassium: 4.7 mmol/L (ref 3.5–5.1)
Sodium: 138 mmol/L (ref 135–145)
TCO2: 30 mmol/L (ref 22–32)

## 2022-09-02 SURGERY — INSERTION, GOLD SEEDS
Anesthesia: Monitor Anesthesia Care | Site: Prostate

## 2022-09-02 MED ORDER — DIPHENHYDRAMINE HCL 50 MG/ML IJ SOLN
12.5000 mg | Freq: Once | INTRAMUSCULAR | Status: AC
  Administered 2022-09-02: 12.5 mg via INTRAVENOUS

## 2022-09-02 MED ORDER — LIDOCAINE 2% (20 MG/ML) 5 ML SYRINGE
INTRAMUSCULAR | Status: DC | PRN
  Administered 2022-09-02: 40 mg via INTRAVENOUS

## 2022-09-02 MED ORDER — DIPHENHYDRAMINE HCL 50 MG/ML IJ SOLN
INTRAMUSCULAR | Status: AC
  Filled 2022-09-02: qty 1

## 2022-09-02 MED ORDER — SODIUM CHLORIDE (PF) 0.9 % IJ SOLN
INTRAMUSCULAR | Status: DC | PRN
  Administered 2022-09-02: 5 mL

## 2022-09-02 MED ORDER — BUPIVACAINE HCL (PF) 0.25 % IJ SOLN
INTRAMUSCULAR | Status: DC | PRN
  Administered 2022-09-02: 10 mL

## 2022-09-02 MED ORDER — LACTATED RINGERS IV SOLN: INTRAVENOUS | Status: DC

## 2022-09-02 MED ORDER — FLEET ENEMA 7-19 GM/118ML RE ENEM: 1.0000 | ENEMA | Freq: Once | RECTAL | Status: DC

## 2022-09-02 MED ORDER — CEFAZOLIN SODIUM-DEXTROSE 2-4 GM/100ML-% IV SOLN
INTRAVENOUS | Status: AC
  Filled 2022-09-02: qty 100

## 2022-09-02 MED ORDER — PROPOFOL 10 MG/ML IV BOLUS
INTRAVENOUS | Status: DC | PRN
  Administered 2022-09-02 (×2): 20 mg via INTRAVENOUS

## 2022-09-02 MED ORDER — CEFAZOLIN SODIUM-DEXTROSE 2-4 GM/100ML-% IV SOLN
2.0000 g | INTRAVENOUS | Status: AC
  Administered 2022-09-02: 2 g via INTRAVENOUS

## 2022-09-02 MED ORDER — DEXMEDETOMIDINE HCL IN NACL 80 MCG/20ML IV SOLN
INTRAVENOUS | Status: DC | PRN
  Administered 2022-09-02: 8 ug via INTRAVENOUS
  Administered 2022-09-02: 4 ug via INTRAVENOUS

## 2022-09-02 MED ORDER — PROPOFOL 500 MG/50ML IV EMUL
INTRAVENOUS | Status: DC | PRN
  Administered 2022-09-02: 180 ug/kg/min via INTRAVENOUS

## 2022-09-02 SURGICAL SUPPLY — 26 items
BLADE CLIPPER SENSICLIP SURGIC (BLADE) ×1 IMPLANT
CNTNR URN SCR LID CUP LEK RST (MISCELLANEOUS) ×1 IMPLANT
CONT SPEC 4OZ STRL OR WHT (MISCELLANEOUS) ×1
COVER BACK TABLE 60X90IN (DRAPES) ×1 IMPLANT
DRSG TEGADERM 4X4.75 (GAUZE/BANDAGES/DRESSINGS) ×1 IMPLANT
DRSG TEGADERM 8X12 (GAUZE/BANDAGES/DRESSINGS) ×1 IMPLANT
Fiducial markers IMPLANT
GAUZE SPONGE 4X4 12PLY STRL (GAUZE/BANDAGES/DRESSINGS) ×1 IMPLANT
GLOVE BIO SURGEON STRL SZ7.5 (GLOVE) ×1 IMPLANT
GLOVE SURG ORTHO 8.5 STRL (GLOVE) ×1 IMPLANT
IMPL SPACEOAR VUE SYSTEM (Spacer) ×1 IMPLANT
IMPLANT SPACEOAR VUE SYSTEM (Spacer) ×1 IMPLANT
KIT TURNOVER CYSTO (KITS) ×1 IMPLANT
MARKER GOLD PRELOAD 1.2X3 (Urological Implant) ×1 IMPLANT
MARKER SKIN DUAL TIP RULER LAB (MISCELLANEOUS) ×1 IMPLANT
NDL SPNL 22GX3.5 QUINCKE BK (NEEDLE) ×1 IMPLANT
NEEDLE SPNL 22GX3.5 QUINCKE BK (NEEDLE) ×1 IMPLANT
SEED GOLD PRELOAD 1.2X3 (Urological Implant) IMPLANT
SHEATH ULTRASOUND LF (SHEATH) ×1 IMPLANT
SHEATH ULTRASOUND LTX NONSTRL (SHEATH) IMPLANT
SLEEVE SCD COMPRESS KNEE MED (STOCKING) ×1 IMPLANT
SURGILUBE 2OZ TUBE FLIPTOP (MISCELLANEOUS) ×1 IMPLANT
SYR 10ML LL (SYRINGE) IMPLANT
SYR CONTROL 10ML LL (SYRINGE) ×1 IMPLANT
TOWEL OR 17X24 6PK STRL BLUE (TOWEL DISPOSABLE) ×1 IMPLANT
UNDERPAD 30X36 HEAVY ABSORB (UNDERPADS AND DIAPERS) ×1 IMPLANT

## 2022-09-02 NOTE — Discharge Instructions (Signed)

## 2022-09-02 NOTE — H&P (Signed)
Urology Preoperative H&P   Chief Complaint: Prostate cancer  History of Present Illness: Shane Kim is a 77 y.o. male, currently followed by Dr. Annabell Howells, with grade 4 prostate cancer who is electing to undergo external beam radiation therapy as primary treatment and is here today for gold seed fiducial marker and SpaceOAR placement.  He is voiding without difficulty denies interval UTIs, dysuria or hematuria.  Past Medical History:  Diagnosis Date   Acute systolic CHF (congestive heart failure) (HCC)    a. echo 11/21/15: EF <20%, mildly dilated LV, severe diffuse global HK, GR3DD, mod MR, LA mildly dilated, RV sys fxn mildly reduced, PASP 52 mmHg   Bladder outlet obstruction 07/22/2013   CAD (coronary artery disease)    a. cardiac cath 11/22/15: dLM 40%, pLAD 80-90%, mLAD sequential 40%, D2 90%, mLCx 90% at takeoff of OM1, RCA appeared occluded proximally, LVEF 20%, PCWP 29    Chickenpox    as child   Chronic diastolic CHF (congestive heart failure) (HCC)    ED (erectile dysfunction)    Elevated PSA    History of UTI    HLD (hyperlipidemia)    Hypertension    Pre-diabetes    Prostate cancer (HCC)    Urinary retention 10/16/2021   Urinary urgency    Weak urine stream    Wears glasses     Past Surgical History:  Procedure Laterality Date   CARDIAC CATHETERIZATION N/A 11/22/2015   Procedure: Right/Left Heart Cath and Coronary Angiography;  Surgeon: Antonieta Iba, MD;  Location: [facility] INVASIVE CV LAB;  Service: Cardiovascular;  Laterality: N/A;   CARDIAC CATHETERIZATION Left 11/29/2015   Procedure: LEFT FEMORAL ARTERIAL LINE INSERTION;  Surgeon: Alleen Borne, MD;  Location: [facility] OR;  Service: Open Heart Surgery;  Laterality: Left;   COLONOSCOPY WITH PROPOFOL N/A 02/14/2019   Procedure: COLONOSCOPY WITH PROPOFOL;  Surgeon: Toledo, Boykin Nearing, MD;  Location: [facility] ENDOSCOPY;  Service: Gastroenterology;  Laterality: N/A;   CORONARY ARTERY BYPASS GRAFT N/A 11/29/2015   Procedure:  CORONARY ARTERY BYPASS GRAFTING (CABG) x four, using left internal mammary artery and right leg saphenous vein harvested endoscopically;  Surgeon: Alleen Borne, MD;  Location: [facility] OR;  Service: Open Heart Surgery;  Laterality: N/A;   PROSTATE BIOPSY     TEE WITHOUT CARDIOVERSION N/A 11/29/2015   Procedure: TRANSESOPHAGEAL ECHOCARDIOGRAM (TEE);  Surgeon: Alleen Borne, MD;  Location: [facility] OR;  Service: Open Heart Surgery;  Laterality: N/A;   TURP VAPORIZATION     prostate 15 yrs ago    Allergies: No Known Allergies  Family History  Problem Relation Age of Onset   Hypertension Mother    Healthy Father     Social History:  reports that he has never smoked. He has never used smokeless tobacco. He reports that he does not drink alcohol and does not use drugs.  ROS: A complete review of systems was performed.  All systems are negative except for pertinent findings as noted.  Physical Exam:  Vital signs in last 24 hours: Temp:  [98.2 F (36.8 C)] 98.2 F (36.8 C) (07/09 0619) Pulse Rate:  [67] 67 (07/09 0619) Resp:  [16] 16 (07/09 0619) BP: (109)/(70) 109/70 (07/09 0619) SpO2:  [99 %] 99 % (07/09 0619) Weight:  [89.9 kg] 89.9 kg (07/09 0619) Constitutional:  Alert and oriented, No acute distress Cardiovascular: Regular rate and rhythm, No JVD Respiratory: Normal respiratory effort, Lungs clear bilaterally GI: Abdomen is soft, nontender, nondistended, no abdominal masses GU: No CVA  tenderness Lymphatic: No lymphadenopathy Neurologic: Grossly intact, no focal deficits Psychiatric: Normal mood and affect  Laboratory Data:  Recent Labs    09/02/22 0632  HGB 11.9*  HCT 35.0*    Recent Labs    09/02/22 0632  NA 138  K 4.7  CL 102  GLUCOSE 110*  BUN 38*  CREATININE 1.80*     Results for orders placed or performed during the hospital encounter of 09/02/22 (from the past 24 hour(s))  I-STAT, chem 8     Status: Abnormal   Collection Time: 09/02/22  6:32 AM  Result Value  Ref Range   Sodium 138 135 - 145 mmol/L   Potassium 4.7 3.5 - 5.1 mmol/L   Chloride 102 98 - 111 mmol/L   BUN 38 (H) 8 - 23 mg/dL   Creatinine, Ser 1.61 (H) 0.61 - 1.24 mg/dL   Glucose, Bld 096 (H) 70 - 99 mg/dL   Calcium, Ion 0.45 (HH) 1.15 - 1.40 mmol/L   TCO2 30 22 - 32 mmol/L   Hemoglobin 11.9 (L) 13.0 - 17.0 g/dL   HCT 40.9 (L) 81.1 - 91.4 %   Comment MD NOTIFIED, REPEAT TEST    No results found for this or any previous visit (from the past 240 hour(s)).  Renal Function: Recent Labs    09/02/22 7829  CREATININE 1.80*   Estimated Creatinine Clearance: 38.7 mL/min (A) (by C-G formula based on SCr of 1.8 mg/dL (H)).  Radiologic Imaging: No results found.  I independently reviewed the above imaging studies.  Assessment and Plan [name] is a 77 y.o. male with grade 4 prostate cancer  The risk, benefits and alternatives of gold seed fiducial marker and SpaceOAR placement was discussed with the patient.  Risk include, but are not limited to, bleeding, urinary tract infection, perineal infection, urethral injury, rectal irritation/ulceration, MI, CVA, DVT and the inherent risk of general anesthesia.  He voices understanding and wishes to proceed.   Rhoderick Moody, MD 09/02/2022, 7:30 AM  [facility] Pager: [phone]

## 2022-09-02 NOTE — Op Note (Addendum)
Operative Note   Preoperative diagnosis:  1.  Grade 4 prostate cancer    Postoperative diagnosis: 1.  Same    Procedure(s): 1.  Transrectal ultrasound guided prostatic gold seed fiducial marker and Space OAR placement   Surgeon: Rhoderick Moody, MD   Assistants:  None    Anesthesia:  MAC   Complications:  None   EBL:  <5 mL   Specimens: 1. None   Drains/Catheters: 1.  None   Intraoperative findings:   Fiducial markers were placed at the prostatic base, mid-gland and apex.  SpaceOAR was placed with good separation of the prostate and rectum. Placement of the fiducial markers and SpaceOAR was technically challenging due to the patient's very small prostate size.   Indication: Shane Kim is a 77 y.o. male, currently followed by Dr. Annabell Howells, with grade 4 prostate cancer.  He has elected to proceed with XRT as primary treatment and is here today for gold seed fiducial marker and SpaceOAR placement.  He has been consented for the above procedures, voices understanding and wishes to proceed.    Description of procedure:   After informed consent the patient was brought to the major OR, placed on the table and administered general anesthesia. He was then moved to the modified lithotomy position with his perineum perpendicular to the floor. His perineum and genitalia were then sterilely prepped. An official timeout was then performed.    Real time transrectal ultrasonography was used visualize the prostate.  Gold seed fiducial markers were then placed transperineally at the prostatic base, mid gland and apex.   I then proceeded with placement of SpaceOAR by introducing a needle with the bevel angled inferiorly approximately 2 cm superior to the anus. This was angled downward and under direct ultrasound was placed within the space between the prostatic capsule and rectum. This was confirmed with a small amount of sterile saline injected and this was performed under direct ultrasound. I  then attached the SpaceOAR to the needle and injected this in the space between the prostate and rectum with good placement noted.  The patient tolerated the procedure well and was transferred to the postanesthesia in stable condition.   Plan: d/c home

## 2022-09-02 NOTE — Transfer of Care (Signed)
Immediate Anesthesia Transfer of Care Note  Patient: Shane Kim  Procedure(s) Performed: GOLD SEED IMPLANT (Prostate) SPACE OAR INSTILLATION (Prostate)  Patient Location: PACU  Anesthesia Type:MAC  Level of Consciousness: sedated  Airway & Oxygen Therapy: Patient Spontanous Breathing and Patient connected to face mask oxygen  Post-op Assessment: Report given to RN and Post -op Vital signs reviewed and stable  Post vital signs: Reviewed and stable  Last Vitals:  Vitals Value Taken Time  BP    Temp    Pulse 69 09/02/22 0844  Resp 13 09/02/22 0844  SpO2 100 % 09/02/22 0844  Vitals shown include unvalidated device data.  Last Pain:  Vitals:   09/02/22 0619  TempSrc: Oral  PainSc: 0-No pain      Patients Stated Pain Goal: 2 (09/02/22 6045)  Complications: No notable events documented.

## 2022-09-03 ENCOUNTER — Encounter (HOSPITAL_BASED_OUTPATIENT_CLINIC_OR_DEPARTMENT_OTHER): Payer: Self-pay | Admitting: Urology

## 2022-09-03 ENCOUNTER — Telehealth: Payer: Self-pay | Admitting: Primary Care

## 2022-09-03 DIAGNOSIS — Z191 Hormone sensitive malignancy status: Secondary | ICD-10-CM | POA: Diagnosis not present

## 2022-09-03 DIAGNOSIS — C61 Malignant neoplasm of prostate: Secondary | ICD-10-CM | POA: Diagnosis not present

## 2022-09-03 NOTE — Anesthesia Postprocedure Evaluation (Signed)
Anesthesia Post Note  Patient: Shane Kim  Procedure(s) Performed: GOLD SEED IMPLANT (Prostate) SPACE OAR INSTILLATION (Prostate)     Patient location during evaluation: PACU Anesthesia Type: MAC Level of consciousness: awake and alert Pain management: pain level controlled Vital Signs Assessment: post-procedure vital signs reviewed and stable Respiratory status: spontaneous breathing Cardiovascular status: stable Anesthetic complications: no   No notable events documented.  Last Vitals:  Vitals:   09/02/22 1020 09/02/22 1054  BP:  120/76  Pulse: (!) 58 (!) 57  Resp: 13 18  Temp:  36.7 C  SpO2: 98% 100%    Last Pain:  Vitals:   09/02/22 1054  TempSrc: Oral  PainSc: 0-No pain                 Lewie Loron

## 2022-09-03 NOTE — Telephone Encounter (Signed)
Prescription Request  09/03/2022  LOV: 06/13/2022  What is the name of the medication or equipment?  spironolactone (ALDACTONE) 25 MG tablet  Have you contacted your pharmacy to request a refill? Yes   Which pharmacy would you like this sent to?   [id number] [place], [place] - [address] AT [facility] OF [address] [place] [address] Phone: [phone] Fax: [phone]    Patient notified that their request is being sent to the clinical staff for review and that they should receive a response within 2 business days.   Please advise at Mobile [phone] (mobile)

## 2022-09-03 NOTE — Telephone Encounter (Signed)
Called and spoke with patients wife, per dpr. Advised spironolactone is prescribed by cardiologist.

## 2022-09-04 ENCOUNTER — Ambulatory Visit
Admission: RE | Admit: 2022-09-04 | Discharge: 2022-09-04 | Disposition: A | Discharge status: Home / Self Care | Payer: Medicare HMO | Source: Ambulatory Visit | Attending: Radiation Oncology | Admitting: Radiation Oncology

## 2022-09-04 DIAGNOSIS — Z8546 Personal history of malignant neoplasm of prostate: Secondary | ICD-10-CM | POA: Insufficient documentation

## 2022-09-04 DIAGNOSIS — C61 Malignant neoplasm of prostate: Secondary | ICD-10-CM | POA: Diagnosis not present

## 2022-09-04 DIAGNOSIS — Z87440 Personal history of urinary (tract) infections: Secondary | ICD-10-CM | POA: Diagnosis not present

## 2022-09-04 DIAGNOSIS — E785 Hyperlipidemia, unspecified: Secondary | ICD-10-CM | POA: Insufficient documentation

## 2022-09-04 DIAGNOSIS — I2510 Atherosclerotic heart disease of native coronary artery without angina pectoris: Secondary | ICD-10-CM | POA: Insufficient documentation

## 2022-09-04 DIAGNOSIS — Z7982 Long term (current) use of aspirin: Secondary | ICD-10-CM | POA: Diagnosis not present

## 2022-09-04 DIAGNOSIS — R911 Solitary pulmonary nodule: Secondary | ICD-10-CM | POA: Insufficient documentation

## 2022-09-04 DIAGNOSIS — R351 Nocturia: Secondary | ICD-10-CM | POA: Insufficient documentation

## 2022-09-04 DIAGNOSIS — I5042 Chronic combined systolic (congestive) and diastolic (congestive) heart failure: Secondary | ICD-10-CM | POA: Diagnosis not present

## 2022-09-04 DIAGNOSIS — Z923 Personal history of irradiation: Secondary | ICD-10-CM | POA: Insufficient documentation

## 2022-09-04 DIAGNOSIS — I110 Hypertensive heart disease with heart failure: Secondary | ICD-10-CM | POA: Insufficient documentation

## 2022-09-04 DIAGNOSIS — Z79899 Other long term (current) drug therapy: Secondary | ICD-10-CM | POA: Diagnosis not present

## 2022-09-04 DIAGNOSIS — Z191 Hormone sensitive malignancy status: Secondary | ICD-10-CM | POA: Diagnosis not present

## 2022-09-08 ENCOUNTER — Other Ambulatory Visit: Payer: Self-pay | Admitting: Primary Care

## 2022-09-08 DIAGNOSIS — N320 Bladder-neck obstruction: Secondary | ICD-10-CM

## 2022-09-08 DIAGNOSIS — E785 Hyperlipidemia, unspecified: Secondary | ICD-10-CM

## 2022-09-10 DIAGNOSIS — E785 Hyperlipidemia, unspecified: Secondary | ICD-10-CM | POA: Diagnosis not present

## 2022-09-10 DIAGNOSIS — I110 Hypertensive heart disease with heart failure: Secondary | ICD-10-CM | POA: Diagnosis not present

## 2022-09-10 DIAGNOSIS — Z191 Hormone sensitive malignancy status: Secondary | ICD-10-CM | POA: Diagnosis not present

## 2022-09-10 DIAGNOSIS — I5042 Chronic combined systolic (congestive) and diastolic (congestive) heart failure: Secondary | ICD-10-CM | POA: Diagnosis not present

## 2022-09-10 DIAGNOSIS — Z87440 Personal history of urinary (tract) infections: Secondary | ICD-10-CM | POA: Diagnosis not present

## 2022-09-10 DIAGNOSIS — R911 Solitary pulmonary nodule: Secondary | ICD-10-CM | POA: Diagnosis not present

## 2022-09-10 DIAGNOSIS — C61 Malignant neoplasm of prostate: Secondary | ICD-10-CM | POA: Diagnosis not present

## 2022-09-10 DIAGNOSIS — Z8546 Personal history of malignant neoplasm of prostate: Secondary | ICD-10-CM | POA: Diagnosis not present

## 2022-09-10 DIAGNOSIS — I2510 Atherosclerotic heart disease of native coronary artery without angina pectoris: Secondary | ICD-10-CM | POA: Diagnosis not present

## 2022-09-10 DIAGNOSIS — R351 Nocturia: Secondary | ICD-10-CM | POA: Diagnosis not present

## 2022-09-12 ENCOUNTER — Other Ambulatory Visit: Payer: Self-pay | Admitting: *Deleted

## 2022-09-12 DIAGNOSIS — C61 Malignant neoplasm of prostate: Secondary | ICD-10-CM

## 2022-09-15 ENCOUNTER — Ambulatory Visit
Admission: RE | Admit: 2022-09-15 | Discharge: 2022-09-15 | Disposition: A | Discharge status: Home / Self Care | Payer: Medicare HMO | Source: Ambulatory Visit | Attending: Radiation Oncology | Admitting: Radiation Oncology

## 2022-09-16 ENCOUNTER — Other Ambulatory Visit: Payer: Self-pay

## 2022-09-16 ENCOUNTER — Ambulatory Visit
Admission: RE | Admit: 2022-09-16 | Discharge: 2022-09-16 | Disposition: A | Discharge status: Home / Self Care | Payer: Medicare HMO | Source: Ambulatory Visit | Attending: Radiation Oncology | Admitting: Radiation Oncology

## 2022-09-16 DIAGNOSIS — R911 Solitary pulmonary nodule: Secondary | ICD-10-CM | POA: Diagnosis not present

## 2022-09-16 DIAGNOSIS — Z510 Encounter for antineoplastic radiation therapy: Secondary | ICD-10-CM | POA: Diagnosis not present

## 2022-09-16 DIAGNOSIS — I5042 Chronic combined systolic (congestive) and diastolic (congestive) heart failure: Secondary | ICD-10-CM | POA: Diagnosis not present

## 2022-09-16 DIAGNOSIS — E785 Hyperlipidemia, unspecified: Secondary | ICD-10-CM | POA: Diagnosis not present

## 2022-09-16 DIAGNOSIS — I110 Hypertensive heart disease with heart failure: Secondary | ICD-10-CM | POA: Diagnosis not present

## 2022-09-16 DIAGNOSIS — I2510 Atherosclerotic heart disease of native coronary artery without angina pectoris: Secondary | ICD-10-CM | POA: Diagnosis not present

## 2022-09-16 DIAGNOSIS — R351 Nocturia: Secondary | ICD-10-CM | POA: Diagnosis not present

## 2022-09-16 DIAGNOSIS — Z8546 Personal history of malignant neoplasm of prostate: Secondary | ICD-10-CM | POA: Diagnosis not present

## 2022-09-16 DIAGNOSIS — C61 Malignant neoplasm of prostate: Secondary | ICD-10-CM | POA: Diagnosis not present

## 2022-09-16 DIAGNOSIS — Z191 Hormone sensitive malignancy status: Secondary | ICD-10-CM | POA: Diagnosis not present

## 2022-09-16 DIAGNOSIS — Z87440 Personal history of urinary (tract) infections: Secondary | ICD-10-CM | POA: Diagnosis not present

## 2022-09-16 LAB — RAD ONC ARIA SESSION SUMMARY
Course Elapsed Days: 0
Plan Fractions Treated to Date: 1
Plan Prescribed Dose Per Fraction: 2.5 Gy
Plan Total Fractions Prescribed: 28
Plan Total Prescribed Dose: 70 Gy
Reference Point Dosage Given to Date: 2.5 Gy
Reference Point Session Dosage Given: 2.5 Gy
Session Number: 1

## 2022-09-17 ENCOUNTER — Ambulatory Visit
Admission: RE | Admit: 2022-09-17 | Discharge: 2022-09-17 | Disposition: A | Discharge status: Home / Self Care | Payer: Medicare HMO | Source: Ambulatory Visit | Attending: Radiation Oncology | Admitting: Radiation Oncology

## 2022-09-17 ENCOUNTER — Other Ambulatory Visit: Payer: Self-pay

## 2022-09-17 DIAGNOSIS — C61 Malignant neoplasm of prostate: Secondary | ICD-10-CM | POA: Diagnosis not present

## 2022-09-17 DIAGNOSIS — E785 Hyperlipidemia, unspecified: Secondary | ICD-10-CM | POA: Diagnosis not present

## 2022-09-17 DIAGNOSIS — I5042 Chronic combined systolic (congestive) and diastolic (congestive) heart failure: Secondary | ICD-10-CM | POA: Diagnosis not present

## 2022-09-17 DIAGNOSIS — I110 Hypertensive heart disease with heart failure: Secondary | ICD-10-CM | POA: Diagnosis not present

## 2022-09-17 DIAGNOSIS — Z87440 Personal history of urinary (tract) infections: Secondary | ICD-10-CM | POA: Diagnosis not present

## 2022-09-17 DIAGNOSIS — I2510 Atherosclerotic heart disease of native coronary artery without angina pectoris: Secondary | ICD-10-CM | POA: Diagnosis not present

## 2022-09-17 DIAGNOSIS — R911 Solitary pulmonary nodule: Secondary | ICD-10-CM | POA: Diagnosis not present

## 2022-09-17 DIAGNOSIS — Z510 Encounter for antineoplastic radiation therapy: Secondary | ICD-10-CM | POA: Diagnosis not present

## 2022-09-17 DIAGNOSIS — Z8546 Personal history of malignant neoplasm of prostate: Secondary | ICD-10-CM | POA: Diagnosis not present

## 2022-09-17 DIAGNOSIS — R351 Nocturia: Secondary | ICD-10-CM | POA: Diagnosis not present

## 2022-09-17 DIAGNOSIS — Z191 Hormone sensitive malignancy status: Secondary | ICD-10-CM | POA: Diagnosis not present

## 2022-09-17 LAB — RAD ONC ARIA SESSION SUMMARY
Course Elapsed Days: 1
Plan Fractions Treated to Date: 2
Plan Prescribed Dose Per Fraction: 2.5 Gy
Plan Total Fractions Prescribed: 28
Plan Total Prescribed Dose: 70 Gy
Reference Point Dosage Given to Date: 5 Gy
Reference Point Session Dosage Given: 2.5 Gy
Session Number: 2

## 2022-09-18 ENCOUNTER — Other Ambulatory Visit: Payer: Self-pay

## 2022-09-18 ENCOUNTER — Ambulatory Visit
Admission: RE | Admit: 2022-09-18 | Discharge: 2022-09-18 | Disposition: A | Discharge status: Home / Self Care | Payer: Medicare HMO | Source: Ambulatory Visit | Attending: Radiation Oncology | Admitting: Radiation Oncology

## 2022-09-18 DIAGNOSIS — Z191 Hormone sensitive malignancy status: Secondary | ICD-10-CM | POA: Diagnosis not present

## 2022-09-18 DIAGNOSIS — I2510 Atherosclerotic heart disease of native coronary artery without angina pectoris: Secondary | ICD-10-CM | POA: Diagnosis not present

## 2022-09-18 DIAGNOSIS — R911 Solitary pulmonary nodule: Secondary | ICD-10-CM | POA: Diagnosis not present

## 2022-09-18 DIAGNOSIS — C61 Malignant neoplasm of prostate: Secondary | ICD-10-CM | POA: Diagnosis not present

## 2022-09-18 DIAGNOSIS — I110 Hypertensive heart disease with heart failure: Secondary | ICD-10-CM | POA: Diagnosis not present

## 2022-09-18 DIAGNOSIS — Z510 Encounter for antineoplastic radiation therapy: Secondary | ICD-10-CM | POA: Diagnosis not present

## 2022-09-18 DIAGNOSIS — I5042 Chronic combined systolic (congestive) and diastolic (congestive) heart failure: Secondary | ICD-10-CM | POA: Diagnosis not present

## 2022-09-18 DIAGNOSIS — R351 Nocturia: Secondary | ICD-10-CM | POA: Diagnosis not present

## 2022-09-18 DIAGNOSIS — Z87440 Personal history of urinary (tract) infections: Secondary | ICD-10-CM | POA: Diagnosis not present

## 2022-09-18 DIAGNOSIS — E785 Hyperlipidemia, unspecified: Secondary | ICD-10-CM | POA: Diagnosis not present

## 2022-09-18 DIAGNOSIS — Z8546 Personal history of malignant neoplasm of prostate: Secondary | ICD-10-CM | POA: Diagnosis not present

## 2022-09-18 LAB — RAD ONC ARIA SESSION SUMMARY
Course Elapsed Days: 2
Plan Fractions Treated to Date: 3
Plan Prescribed Dose Per Fraction: 2.5 Gy
Plan Total Fractions Prescribed: 28
Plan Total Prescribed Dose: 70 Gy
Reference Point Dosage Given to Date: 7.5 Gy
Reference Point Session Dosage Given: 2.5 Gy
Session Number: 3

## 2022-09-19 ENCOUNTER — Other Ambulatory Visit: Payer: Self-pay

## 2022-09-19 ENCOUNTER — Ambulatory Visit: Admission: RE | Admit: 2022-09-19 | Payer: Medicare HMO | Source: Ambulatory Visit

## 2022-09-19 DIAGNOSIS — Z191 Hormone sensitive malignancy status: Secondary | ICD-10-CM | POA: Diagnosis not present

## 2022-09-19 DIAGNOSIS — R911 Solitary pulmonary nodule: Secondary | ICD-10-CM | POA: Diagnosis not present

## 2022-09-19 DIAGNOSIS — C61 Malignant neoplasm of prostate: Secondary | ICD-10-CM | POA: Diagnosis not present

## 2022-09-19 DIAGNOSIS — I2510 Atherosclerotic heart disease of native coronary artery without angina pectoris: Secondary | ICD-10-CM | POA: Diagnosis not present

## 2022-09-19 DIAGNOSIS — E785 Hyperlipidemia, unspecified: Secondary | ICD-10-CM | POA: Diagnosis not present

## 2022-09-19 DIAGNOSIS — Z510 Encounter for antineoplastic radiation therapy: Secondary | ICD-10-CM | POA: Diagnosis not present

## 2022-09-19 DIAGNOSIS — I5042 Chronic combined systolic (congestive) and diastolic (congestive) heart failure: Secondary | ICD-10-CM | POA: Diagnosis not present

## 2022-09-19 DIAGNOSIS — I110 Hypertensive heart disease with heart failure: Secondary | ICD-10-CM | POA: Diagnosis not present

## 2022-09-19 DIAGNOSIS — Z87440 Personal history of urinary (tract) infections: Secondary | ICD-10-CM | POA: Diagnosis not present

## 2022-09-19 DIAGNOSIS — Z8546 Personal history of malignant neoplasm of prostate: Secondary | ICD-10-CM | POA: Diagnosis not present

## 2022-09-19 DIAGNOSIS — R351 Nocturia: Secondary | ICD-10-CM | POA: Diagnosis not present

## 2022-09-19 LAB — RAD ONC ARIA SESSION SUMMARY
Course Elapsed Days: 3
Plan Fractions Treated to Date: 4
Plan Prescribed Dose Per Fraction: 2.5 Gy
Plan Total Fractions Prescribed: 28
Plan Total Prescribed Dose: 70 Gy
Reference Point Dosage Given to Date: 10 Gy
Reference Point Session Dosage Given: 2.5 Gy
Session Number: 4

## 2022-09-22 ENCOUNTER — Inpatient Hospital Stay: Payer: Medicare HMO | Attending: Radiation Oncology

## 2022-09-22 ENCOUNTER — Other Ambulatory Visit: Payer: Self-pay

## 2022-09-22 ENCOUNTER — Ambulatory Visit
Admission: RE | Admit: 2022-09-22 | Discharge: 2022-09-22 | Disposition: A | Discharge status: Home / Self Care | Payer: Medicare HMO | Source: Ambulatory Visit | Attending: Radiation Oncology | Admitting: Radiation Oncology

## 2022-09-22 DIAGNOSIS — I5042 Chronic combined systolic (congestive) and diastolic (congestive) heart failure: Secondary | ICD-10-CM | POA: Diagnosis not present

## 2022-09-22 DIAGNOSIS — Z8546 Personal history of malignant neoplasm of prostate: Secondary | ICD-10-CM | POA: Diagnosis not present

## 2022-09-22 DIAGNOSIS — I110 Hypertensive heart disease with heart failure: Secondary | ICD-10-CM | POA: Diagnosis not present

## 2022-09-22 DIAGNOSIS — R351 Nocturia: Secondary | ICD-10-CM | POA: Diagnosis not present

## 2022-09-22 DIAGNOSIS — Z87440 Personal history of urinary (tract) infections: Secondary | ICD-10-CM | POA: Diagnosis not present

## 2022-09-22 DIAGNOSIS — I2510 Atherosclerotic heart disease of native coronary artery without angina pectoris: Secondary | ICD-10-CM | POA: Diagnosis not present

## 2022-09-22 DIAGNOSIS — R911 Solitary pulmonary nodule: Secondary | ICD-10-CM | POA: Diagnosis not present

## 2022-09-22 DIAGNOSIS — E785 Hyperlipidemia, unspecified: Secondary | ICD-10-CM | POA: Diagnosis not present

## 2022-09-22 DIAGNOSIS — C61 Malignant neoplasm of prostate: Secondary | ICD-10-CM | POA: Diagnosis not present

## 2022-09-22 DIAGNOSIS — Z191 Hormone sensitive malignancy status: Secondary | ICD-10-CM | POA: Diagnosis not present

## 2022-09-22 DIAGNOSIS — Z510 Encounter for antineoplastic radiation therapy: Secondary | ICD-10-CM | POA: Diagnosis not present

## 2022-09-22 LAB — RAD ONC ARIA SESSION SUMMARY
Course Elapsed Days: 6
Plan Fractions Treated to Date: 5
Plan Prescribed Dose Per Fraction: 2.5 Gy
Plan Total Fractions Prescribed: 28
Plan Total Prescribed Dose: 70 Gy
Reference Point Dosage Given to Date: 12.5 Gy
Reference Point Session Dosage Given: 2.5 Gy
Session Number: 5

## 2022-09-23 ENCOUNTER — Other Ambulatory Visit: Payer: Self-pay

## 2022-09-23 ENCOUNTER — Ambulatory Visit
Admission: RE | Admit: 2022-09-23 | Discharge: 2022-09-23 | Disposition: A | Discharge status: Home / Self Care | Payer: Medicare HMO | Source: Ambulatory Visit | Attending: Radiation Oncology | Admitting: Radiation Oncology

## 2022-09-23 DIAGNOSIS — E785 Hyperlipidemia, unspecified: Secondary | ICD-10-CM | POA: Diagnosis not present

## 2022-09-23 DIAGNOSIS — Z191 Hormone sensitive malignancy status: Secondary | ICD-10-CM | POA: Diagnosis not present

## 2022-09-23 DIAGNOSIS — I2510 Atherosclerotic heart disease of native coronary artery without angina pectoris: Secondary | ICD-10-CM | POA: Diagnosis not present

## 2022-09-23 DIAGNOSIS — I110 Hypertensive heart disease with heart failure: Secondary | ICD-10-CM | POA: Diagnosis not present

## 2022-09-23 DIAGNOSIS — R911 Solitary pulmonary nodule: Secondary | ICD-10-CM | POA: Diagnosis not present

## 2022-09-23 DIAGNOSIS — C61 Malignant neoplasm of prostate: Secondary | ICD-10-CM | POA: Diagnosis not present

## 2022-09-23 DIAGNOSIS — R351 Nocturia: Secondary | ICD-10-CM | POA: Diagnosis not present

## 2022-09-23 DIAGNOSIS — Z8546 Personal history of malignant neoplasm of prostate: Secondary | ICD-10-CM | POA: Diagnosis not present

## 2022-09-23 DIAGNOSIS — I5042 Chronic combined systolic (congestive) and diastolic (congestive) heart failure: Secondary | ICD-10-CM | POA: Diagnosis not present

## 2022-09-23 DIAGNOSIS — Z510 Encounter for antineoplastic radiation therapy: Secondary | ICD-10-CM | POA: Diagnosis not present

## 2022-09-23 DIAGNOSIS — Z87440 Personal history of urinary (tract) infections: Secondary | ICD-10-CM | POA: Diagnosis not present

## 2022-09-23 LAB — RAD ONC ARIA SESSION SUMMARY
Course Elapsed Days: 7
Plan Fractions Treated to Date: 6
Plan Prescribed Dose Per Fraction: 2.5 Gy
Plan Total Fractions Prescribed: 28
Plan Total Prescribed Dose: 70 Gy
Reference Point Dosage Given to Date: 15 Gy
Reference Point Session Dosage Given: 2.5 Gy
Session Number: 6

## 2022-09-24 ENCOUNTER — Ambulatory Visit
Admission: RE | Admit: 2022-09-24 | Discharge: 2022-09-24 | Disposition: A | Discharge status: Home / Self Care | Payer: Medicare HMO | Source: Ambulatory Visit | Attending: Radiation Oncology | Admitting: Radiation Oncology

## 2022-09-24 ENCOUNTER — Other Ambulatory Visit: Payer: Self-pay

## 2022-09-24 DIAGNOSIS — I110 Hypertensive heart disease with heart failure: Secondary | ICD-10-CM | POA: Diagnosis not present

## 2022-09-24 DIAGNOSIS — R911 Solitary pulmonary nodule: Secondary | ICD-10-CM | POA: Diagnosis not present

## 2022-09-24 DIAGNOSIS — Z87440 Personal history of urinary (tract) infections: Secondary | ICD-10-CM | POA: Diagnosis not present

## 2022-09-24 DIAGNOSIS — I5042 Chronic combined systolic (congestive) and diastolic (congestive) heart failure: Secondary | ICD-10-CM | POA: Diagnosis not present

## 2022-09-24 DIAGNOSIS — Z510 Encounter for antineoplastic radiation therapy: Secondary | ICD-10-CM | POA: Diagnosis not present

## 2022-09-24 DIAGNOSIS — Z191 Hormone sensitive malignancy status: Secondary | ICD-10-CM | POA: Diagnosis not present

## 2022-09-24 DIAGNOSIS — R351 Nocturia: Secondary | ICD-10-CM | POA: Diagnosis not present

## 2022-09-24 DIAGNOSIS — C61 Malignant neoplasm of prostate: Secondary | ICD-10-CM | POA: Diagnosis not present

## 2022-09-24 DIAGNOSIS — Z8546 Personal history of malignant neoplasm of prostate: Secondary | ICD-10-CM | POA: Diagnosis not present

## 2022-09-24 DIAGNOSIS — E785 Hyperlipidemia, unspecified: Secondary | ICD-10-CM | POA: Diagnosis not present

## 2022-09-24 DIAGNOSIS — I2510 Atherosclerotic heart disease of native coronary artery without angina pectoris: Secondary | ICD-10-CM | POA: Diagnosis not present

## 2022-09-24 LAB — RAD ONC ARIA SESSION SUMMARY
Course Elapsed Days: 8
Plan Fractions Treated to Date: 7
Plan Prescribed Dose Per Fraction: 2.5 Gy
Plan Total Fractions Prescribed: 28
Plan Total Prescribed Dose: 70 Gy
Reference Point Dosage Given to Date: 17.5 Gy
Reference Point Session Dosage Given: 2.5 Gy
Session Number: 7

## 2022-09-25 ENCOUNTER — Ambulatory Visit
Admission: RE | Admit: 2022-09-25 | Discharge: 2022-09-25 | Disposition: A | Discharge status: Home / Self Care | Payer: Medicare HMO | Source: Ambulatory Visit | Attending: Radiation Oncology | Admitting: Radiation Oncology

## 2022-09-25 ENCOUNTER — Other Ambulatory Visit: Payer: Self-pay

## 2022-09-25 DIAGNOSIS — C61 Malignant neoplasm of prostate: Secondary | ICD-10-CM | POA: Insufficient documentation

## 2022-09-25 DIAGNOSIS — Z191 Hormone sensitive malignancy status: Secondary | ICD-10-CM | POA: Diagnosis not present

## 2022-09-25 DIAGNOSIS — Z510 Encounter for antineoplastic radiation therapy: Secondary | ICD-10-CM | POA: Insufficient documentation

## 2022-09-25 LAB — RAD ONC ARIA SESSION SUMMARY
Course Elapsed Days: 9
Plan Fractions Treated to Date: 8
Plan Prescribed Dose Per Fraction: 2.5 Gy
Plan Total Fractions Prescribed: 28
Plan Total Prescribed Dose: 70 Gy
Reference Point Dosage Given to Date: 20 Gy
Reference Point Session Dosage Given: 2.5 Gy
Session Number: 8

## 2022-09-26 ENCOUNTER — Ambulatory Visit
Admission: RE | Admit: 2022-09-26 | Discharge: 2022-09-26 | Disposition: A | Discharge status: Home / Self Care | Payer: Medicare HMO | Source: Ambulatory Visit | Attending: Radiation Oncology | Admitting: Radiation Oncology

## 2022-09-26 ENCOUNTER — Other Ambulatory Visit: Payer: Self-pay

## 2022-09-26 DIAGNOSIS — Z191 Hormone sensitive malignancy status: Secondary | ICD-10-CM | POA: Diagnosis not present

## 2022-09-26 DIAGNOSIS — Z510 Encounter for antineoplastic radiation therapy: Secondary | ICD-10-CM | POA: Diagnosis not present

## 2022-09-26 DIAGNOSIS — C61 Malignant neoplasm of prostate: Secondary | ICD-10-CM | POA: Diagnosis not present

## 2022-09-26 LAB — RAD ONC ARIA SESSION SUMMARY
Course Elapsed Days: 10
Plan Fractions Treated to Date: 9
Plan Prescribed Dose Per Fraction: 2.5 Gy
Plan Total Fractions Prescribed: 28
Plan Total Prescribed Dose: 70 Gy
Reference Point Dosage Given to Date: 22.5 Gy
Reference Point Session Dosage Given: 2.5 Gy
Session Number: 9

## 2022-09-29 ENCOUNTER — Other Ambulatory Visit: Payer: Self-pay

## 2022-09-29 ENCOUNTER — Ambulatory Visit: Admission: RE | Admit: 2022-09-29 | Payer: Medicare HMO | Source: Ambulatory Visit

## 2022-09-29 DIAGNOSIS — C61 Malignant neoplasm of prostate: Secondary | ICD-10-CM | POA: Diagnosis not present

## 2022-09-29 DIAGNOSIS — Z510 Encounter for antineoplastic radiation therapy: Secondary | ICD-10-CM | POA: Diagnosis not present

## 2022-09-29 DIAGNOSIS — Z191 Hormone sensitive malignancy status: Secondary | ICD-10-CM | POA: Diagnosis not present

## 2022-09-29 LAB — RAD ONC ARIA SESSION SUMMARY
Course Elapsed Days: 13
Plan Fractions Treated to Date: 10
Plan Prescribed Dose Per Fraction: 2.5 Gy
Plan Total Fractions Prescribed: 28
Plan Total Prescribed Dose: 70 Gy
Reference Point Dosage Given to Date: 25 Gy
Reference Point Session Dosage Given: 2.5 Gy
Session Number: 10

## 2022-09-30 ENCOUNTER — Ambulatory Visit
Admission: RE | Admit: 2022-09-30 | Discharge: 2022-09-30 | Disposition: A | Discharge status: Home / Self Care | Payer: Medicare HMO | Source: Ambulatory Visit | Attending: Radiation Oncology | Admitting: Radiation Oncology

## 2022-09-30 ENCOUNTER — Other Ambulatory Visit: Payer: Self-pay

## 2022-09-30 DIAGNOSIS — Z191 Hormone sensitive malignancy status: Secondary | ICD-10-CM | POA: Diagnosis not present

## 2022-09-30 DIAGNOSIS — C61 Malignant neoplasm of prostate: Secondary | ICD-10-CM | POA: Diagnosis not present

## 2022-09-30 DIAGNOSIS — Z510 Encounter for antineoplastic radiation therapy: Secondary | ICD-10-CM | POA: Diagnosis not present

## 2022-09-30 LAB — RAD ONC ARIA SESSION SUMMARY
Course Elapsed Days: 14
Plan Fractions Treated to Date: 11
Plan Prescribed Dose Per Fraction: 2.5 Gy
Plan Total Fractions Prescribed: 28
Plan Total Prescribed Dose: 70 Gy
Reference Point Dosage Given to Date: 27.5 Gy
Reference Point Session Dosage Given: 2.5 Gy
Session Number: 11

## 2022-10-01 ENCOUNTER — Other Ambulatory Visit: Payer: Self-pay

## 2022-10-01 ENCOUNTER — Ambulatory Visit
Admission: RE | Admit: 2022-10-01 | Discharge: 2022-10-01 | Disposition: A | Discharge status: Home / Self Care | Payer: Medicare HMO | Source: Ambulatory Visit | Attending: Radiation Oncology | Admitting: Radiation Oncology

## 2022-10-01 DIAGNOSIS — Z510 Encounter for antineoplastic radiation therapy: Secondary | ICD-10-CM | POA: Diagnosis not present

## 2022-10-01 DIAGNOSIS — Z191 Hormone sensitive malignancy status: Secondary | ICD-10-CM | POA: Diagnosis not present

## 2022-10-01 DIAGNOSIS — C61 Malignant neoplasm of prostate: Secondary | ICD-10-CM | POA: Diagnosis not present

## 2022-10-01 LAB — RAD ONC ARIA SESSION SUMMARY
Course Elapsed Days: 15
Plan Fractions Treated to Date: 12
Plan Prescribed Dose Per Fraction: 2.5 Gy
Plan Total Fractions Prescribed: 28
Plan Total Prescribed Dose: 70 Gy
Reference Point Dosage Given to Date: 30 Gy
Reference Point Session Dosage Given: 2.5 Gy
Session Number: 12

## 2022-10-02 ENCOUNTER — Other Ambulatory Visit: Payer: Self-pay

## 2022-10-02 ENCOUNTER — Ambulatory Visit
Admission: RE | Admit: 2022-10-02 | Discharge: 2022-10-02 | Disposition: A | Discharge status: Home / Self Care | Payer: Medicare HMO | Source: Ambulatory Visit | Attending: Radiation Oncology | Admitting: Radiation Oncology

## 2022-10-02 DIAGNOSIS — Z510 Encounter for antineoplastic radiation therapy: Secondary | ICD-10-CM | POA: Diagnosis not present

## 2022-10-02 DIAGNOSIS — C61 Malignant neoplasm of prostate: Secondary | ICD-10-CM | POA: Diagnosis not present

## 2022-10-02 DIAGNOSIS — Z191 Hormone sensitive malignancy status: Secondary | ICD-10-CM | POA: Diagnosis not present

## 2022-10-02 LAB — RAD ONC ARIA SESSION SUMMARY
Course Elapsed Days: 16
Plan Fractions Treated to Date: 13
Plan Prescribed Dose Per Fraction: 2.5 Gy
Plan Total Fractions Prescribed: 28
Plan Total Prescribed Dose: 70 Gy
Reference Point Dosage Given to Date: 32.5 Gy
Reference Point Session Dosage Given: 2.5 Gy
Session Number: 13

## 2022-10-03 ENCOUNTER — Ambulatory Visit: Admission: RE | Admit: 2022-10-03 | Payer: Medicare HMO | Source: Ambulatory Visit

## 2022-10-03 ENCOUNTER — Other Ambulatory Visit: Payer: Self-pay

## 2022-10-03 DIAGNOSIS — C61 Malignant neoplasm of prostate: Secondary | ICD-10-CM | POA: Diagnosis not present

## 2022-10-03 DIAGNOSIS — Z510 Encounter for antineoplastic radiation therapy: Secondary | ICD-10-CM | POA: Diagnosis not present

## 2022-10-03 DIAGNOSIS — Z191 Hormone sensitive malignancy status: Secondary | ICD-10-CM | POA: Diagnosis not present

## 2022-10-03 LAB — RAD ONC ARIA SESSION SUMMARY
Course Elapsed Days: 17
Plan Fractions Treated to Date: 14
Plan Prescribed Dose Per Fraction: 2.5 Gy
Plan Total Fractions Prescribed: 28
Plan Total Prescribed Dose: 70 Gy
Reference Point Dosage Given to Date: 35 Gy
Reference Point Session Dosage Given: 2.5 Gy
Session Number: 14

## 2022-10-06 ENCOUNTER — Other Ambulatory Visit: Payer: Self-pay

## 2022-10-06 ENCOUNTER — Inpatient Hospital Stay: Payer: Medicare HMO | Attending: Radiation Oncology

## 2022-10-06 ENCOUNTER — Ambulatory Visit
Admission: RE | Admit: 2022-10-06 | Discharge: 2022-10-06 | Disposition: A | Discharge status: Home / Self Care | Payer: Medicare HMO | Source: Ambulatory Visit | Attending: Radiation Oncology | Admitting: Radiation Oncology

## 2022-10-06 DIAGNOSIS — Z191 Hormone sensitive malignancy status: Secondary | ICD-10-CM | POA: Diagnosis not present

## 2022-10-06 DIAGNOSIS — Z510 Encounter for antineoplastic radiation therapy: Secondary | ICD-10-CM | POA: Diagnosis not present

## 2022-10-06 DIAGNOSIS — C61 Malignant neoplasm of prostate: Secondary | ICD-10-CM | POA: Diagnosis not present

## 2022-10-06 LAB — RAD ONC ARIA SESSION SUMMARY
Course Elapsed Days: 20
Plan Fractions Treated to Date: 15
Plan Prescribed Dose Per Fraction: 2.5 Gy
Plan Total Fractions Prescribed: 28
Plan Total Prescribed Dose: 70 Gy
Reference Point Dosage Given to Date: 37.5 Gy
Reference Point Session Dosage Given: 2.5 Gy
Session Number: 15

## 2022-10-07 ENCOUNTER — Other Ambulatory Visit: Payer: Self-pay

## 2022-10-07 ENCOUNTER — Ambulatory Visit
Admission: RE | Admit: 2022-10-07 | Discharge: 2022-10-07 | Disposition: A | Discharge status: Home / Self Care | Payer: Medicare HMO | Source: Ambulatory Visit | Attending: Radiation Oncology | Admitting: Radiation Oncology

## 2022-10-07 DIAGNOSIS — Z191 Hormone sensitive malignancy status: Secondary | ICD-10-CM | POA: Diagnosis not present

## 2022-10-07 DIAGNOSIS — C61 Malignant neoplasm of prostate: Secondary | ICD-10-CM | POA: Diagnosis not present

## 2022-10-07 DIAGNOSIS — Z510 Encounter for antineoplastic radiation therapy: Secondary | ICD-10-CM | POA: Diagnosis not present

## 2022-10-07 LAB — RAD ONC ARIA SESSION SUMMARY
Course Elapsed Days: 21
Plan Fractions Treated to Date: 16
Plan Prescribed Dose Per Fraction: 2.5 Gy
Plan Total Fractions Prescribed: 28
Plan Total Prescribed Dose: 70 Gy
Reference Point Dosage Given to Date: 40 Gy
Reference Point Session Dosage Given: 2.5 Gy
Session Number: 16

## 2022-10-08 ENCOUNTER — Other Ambulatory Visit: Payer: Self-pay

## 2022-10-08 ENCOUNTER — Ambulatory Visit
Admission: RE | Admit: 2022-10-08 | Discharge: 2022-10-08 | Disposition: A | Discharge status: Home / Self Care | Payer: Medicare HMO | Source: Ambulatory Visit | Attending: Radiation Oncology | Admitting: Radiation Oncology

## 2022-10-08 ENCOUNTER — Encounter: Payer: Self-pay | Admitting: Pharmacist

## 2022-10-08 DIAGNOSIS — C61 Malignant neoplasm of prostate: Secondary | ICD-10-CM | POA: Diagnosis not present

## 2022-10-08 DIAGNOSIS — Z510 Encounter for antineoplastic radiation therapy: Secondary | ICD-10-CM | POA: Diagnosis not present

## 2022-10-08 DIAGNOSIS — Z191 Hormone sensitive malignancy status: Secondary | ICD-10-CM | POA: Diagnosis not present

## 2022-10-08 LAB — RAD ONC ARIA SESSION SUMMARY
Course Elapsed Days: 22
Plan Fractions Treated to Date: 17
Plan Prescribed Dose Per Fraction: 2.5 Gy
Plan Total Fractions Prescribed: 28
Plan Total Prescribed Dose: 70 Gy
Reference Point Dosage Given to Date: 42.5 Gy
Reference Point Session Dosage Given: 2.5 Gy
Session Number: 17

## 2022-10-08 NOTE — Progress Notes (Signed)
Pharmacy Quality Measure Review  This patient is appearing on a report for being at risk of failing the adherence measure for cholesterol (statin) medications this calendar year.   Medication: rosuvastatin 40 mg  Last fill date: 7/16 for 90 day supply  Insurance report was not up to date. No action needed at this time.   Catie Eppie Gibson, PharmD, BCACP, CPP Clinical Pharmacist [facility] [phone]

## 2022-10-09 ENCOUNTER — Ambulatory Visit
Admission: RE | Admit: 2022-10-09 | Discharge: 2022-10-09 | Disposition: A | Discharge status: Home / Self Care | Payer: Medicare HMO | Source: Ambulatory Visit | Attending: Radiation Oncology | Admitting: Radiation Oncology

## 2022-10-09 ENCOUNTER — Other Ambulatory Visit: Payer: Self-pay

## 2022-10-09 DIAGNOSIS — Z510 Encounter for antineoplastic radiation therapy: Secondary | ICD-10-CM | POA: Diagnosis not present

## 2022-10-09 DIAGNOSIS — Z191 Hormone sensitive malignancy status: Secondary | ICD-10-CM | POA: Diagnosis not present

## 2022-10-09 DIAGNOSIS — C61 Malignant neoplasm of prostate: Secondary | ICD-10-CM | POA: Diagnosis not present

## 2022-10-09 LAB — RAD ONC ARIA SESSION SUMMARY
Course Elapsed Days: 23
Plan Fractions Treated to Date: 18
Plan Prescribed Dose Per Fraction: 2.5 Gy
Plan Total Fractions Prescribed: 28
Plan Total Prescribed Dose: 70 Gy
Reference Point Dosage Given to Date: 45 Gy
Reference Point Session Dosage Given: 2.5 Gy
Session Number: 18

## 2022-10-10 ENCOUNTER — Other Ambulatory Visit: Payer: Self-pay

## 2022-10-10 ENCOUNTER — Ambulatory Visit
Admission: RE | Admit: 2022-10-10 | Discharge: 2022-10-10 | Disposition: A | Discharge status: Home / Self Care | Payer: Medicare HMO | Source: Ambulatory Visit | Attending: Radiation Oncology | Admitting: Radiation Oncology

## 2022-10-10 DIAGNOSIS — Z191 Hormone sensitive malignancy status: Secondary | ICD-10-CM | POA: Diagnosis not present

## 2022-10-10 DIAGNOSIS — C61 Malignant neoplasm of prostate: Secondary | ICD-10-CM | POA: Diagnosis not present

## 2022-10-10 DIAGNOSIS — Z510 Encounter for antineoplastic radiation therapy: Secondary | ICD-10-CM | POA: Diagnosis not present

## 2022-10-10 LAB — RAD ONC ARIA SESSION SUMMARY
Course Elapsed Days: 24
Plan Fractions Treated to Date: 19
Plan Prescribed Dose Per Fraction: 2.5 Gy
Plan Total Fractions Prescribed: 28
Plan Total Prescribed Dose: 70 Gy
Reference Point Dosage Given to Date: 47.5 Gy
Reference Point Session Dosage Given: 2.5 Gy
Session Number: 19

## 2022-10-13 ENCOUNTER — Ambulatory Visit
Admission: RE | Admit: 2022-10-13 | Discharge: 2022-10-13 | Disposition: A | Discharge status: Home / Self Care | Payer: Medicare HMO | Source: Ambulatory Visit | Attending: Radiation Oncology | Admitting: Radiation Oncology

## 2022-10-13 ENCOUNTER — Other Ambulatory Visit: Payer: Self-pay

## 2022-10-13 DIAGNOSIS — Z510 Encounter for antineoplastic radiation therapy: Secondary | ICD-10-CM | POA: Diagnosis not present

## 2022-10-13 DIAGNOSIS — Z191 Hormone sensitive malignancy status: Secondary | ICD-10-CM | POA: Diagnosis not present

## 2022-10-13 DIAGNOSIS — C61 Malignant neoplasm of prostate: Secondary | ICD-10-CM | POA: Diagnosis not present

## 2022-10-13 LAB — RAD ONC ARIA SESSION SUMMARY
Course Elapsed Days: 27
Plan Fractions Treated to Date: 20
Plan Prescribed Dose Per Fraction: 2.5 Gy
Plan Total Fractions Prescribed: 28
Plan Total Prescribed Dose: 70 Gy
Reference Point Dosage Given to Date: 50 Gy
Reference Point Session Dosage Given: 2.5 Gy
Session Number: 20

## 2022-10-14 ENCOUNTER — Ambulatory Visit: Admission: RE | Admit: 2022-10-14 | Payer: Medicare HMO | Source: Ambulatory Visit

## 2022-10-14 ENCOUNTER — Other Ambulatory Visit: Payer: Self-pay

## 2022-10-14 DIAGNOSIS — Z510 Encounter for antineoplastic radiation therapy: Secondary | ICD-10-CM | POA: Diagnosis not present

## 2022-10-14 DIAGNOSIS — C61 Malignant neoplasm of prostate: Secondary | ICD-10-CM | POA: Diagnosis not present

## 2022-10-14 DIAGNOSIS — Z191 Hormone sensitive malignancy status: Secondary | ICD-10-CM | POA: Diagnosis not present

## 2022-10-14 LAB — RAD ONC ARIA SESSION SUMMARY
Course Elapsed Days: 28
Plan Fractions Treated to Date: 21
Plan Prescribed Dose Per Fraction: 2.5 Gy
Plan Total Fractions Prescribed: 28
Plan Total Prescribed Dose: 70 Gy
Reference Point Dosage Given to Date: 52.5 Gy
Reference Point Session Dosage Given: 2.5 Gy
Session Number: 21

## 2022-10-15 ENCOUNTER — Ambulatory Visit
Admission: RE | Admit: 2022-10-15 | Discharge: 2022-10-15 | Disposition: A | Discharge status: Home / Self Care | Payer: Medicare HMO | Source: Ambulatory Visit | Attending: Radiation Oncology | Admitting: Radiation Oncology

## 2022-10-15 ENCOUNTER — Other Ambulatory Visit: Payer: Self-pay

## 2022-10-15 DIAGNOSIS — C61 Malignant neoplasm of prostate: Secondary | ICD-10-CM | POA: Diagnosis not present

## 2022-10-15 DIAGNOSIS — Z510 Encounter for antineoplastic radiation therapy: Secondary | ICD-10-CM | POA: Diagnosis not present

## 2022-10-15 DIAGNOSIS — Z191 Hormone sensitive malignancy status: Secondary | ICD-10-CM | POA: Diagnosis not present

## 2022-10-15 LAB — RAD ONC ARIA SESSION SUMMARY
Course Elapsed Days: 29
Plan Fractions Treated to Date: 22
Plan Prescribed Dose Per Fraction: 2.5 Gy
Plan Total Fractions Prescribed: 28
Plan Total Prescribed Dose: 70 Gy
Reference Point Dosage Given to Date: 55 Gy
Reference Point Session Dosage Given: 2.5 Gy
Session Number: 22

## 2022-10-16 ENCOUNTER — Other Ambulatory Visit: Payer: Self-pay

## 2022-10-16 ENCOUNTER — Ambulatory Visit
Admission: RE | Admit: 2022-10-16 | Discharge: 2022-10-16 | Disposition: A | Discharge status: Home / Self Care | Payer: Medicare HMO | Source: Ambulatory Visit | Attending: Radiation Oncology | Admitting: Radiation Oncology

## 2022-10-16 DIAGNOSIS — C61 Malignant neoplasm of prostate: Secondary | ICD-10-CM | POA: Diagnosis not present

## 2022-10-16 DIAGNOSIS — Z510 Encounter for antineoplastic radiation therapy: Secondary | ICD-10-CM | POA: Diagnosis not present

## 2022-10-16 DIAGNOSIS — Z191 Hormone sensitive malignancy status: Secondary | ICD-10-CM | POA: Diagnosis not present

## 2022-10-16 LAB — RAD ONC ARIA SESSION SUMMARY
Course Elapsed Days: 30
Plan Fractions Treated to Date: 23
Plan Prescribed Dose Per Fraction: 2.5 Gy
Plan Total Fractions Prescribed: 28
Plan Total Prescribed Dose: 70 Gy
Reference Point Dosage Given to Date: 57.5 Gy
Reference Point Session Dosage Given: 2.5 Gy
Session Number: 23

## 2022-10-17 ENCOUNTER — Ambulatory Visit
Admission: RE | Admit: 2022-10-17 | Discharge: 2022-10-17 | Disposition: A | Discharge status: Home / Self Care | Payer: Medicare HMO | Source: Ambulatory Visit | Attending: Radiation Oncology | Admitting: Radiation Oncology

## 2022-10-17 ENCOUNTER — Other Ambulatory Visit: Payer: Self-pay

## 2022-10-17 DIAGNOSIS — Z510 Encounter for antineoplastic radiation therapy: Secondary | ICD-10-CM | POA: Diagnosis not present

## 2022-10-17 DIAGNOSIS — Z191 Hormone sensitive malignancy status: Secondary | ICD-10-CM | POA: Diagnosis not present

## 2022-10-17 DIAGNOSIS — C61 Malignant neoplasm of prostate: Secondary | ICD-10-CM | POA: Diagnosis not present

## 2022-10-17 LAB — RAD ONC ARIA SESSION SUMMARY
Course Elapsed Days: 31
Plan Fractions Treated to Date: 24
Plan Prescribed Dose Per Fraction: 2.5 Gy
Plan Total Fractions Prescribed: 28
Plan Total Prescribed Dose: 70 Gy
Reference Point Dosage Given to Date: 60 Gy
Reference Point Session Dosage Given: 2.5 Gy
Session Number: 24

## 2022-10-20 ENCOUNTER — Other Ambulatory Visit: Payer: Self-pay | Admitting: *Deleted

## 2022-10-20 ENCOUNTER — Other Ambulatory Visit: Payer: Self-pay

## 2022-10-20 ENCOUNTER — Inpatient Hospital Stay: Payer: Medicare HMO

## 2022-10-20 ENCOUNTER — Ambulatory Visit: Admission: RE | Admit: 2022-10-20 | Payer: Medicare HMO | Source: Ambulatory Visit

## 2022-10-20 DIAGNOSIS — Z191 Hormone sensitive malignancy status: Secondary | ICD-10-CM | POA: Diagnosis not present

## 2022-10-20 DIAGNOSIS — Z510 Encounter for antineoplastic radiation therapy: Secondary | ICD-10-CM | POA: Diagnosis not present

## 2022-10-20 DIAGNOSIS — C61 Malignant neoplasm of prostate: Secondary | ICD-10-CM | POA: Diagnosis not present

## 2022-10-20 LAB — RAD ONC ARIA SESSION SUMMARY
Course Elapsed Days: 34
Plan Fractions Treated to Date: 25
Plan Prescribed Dose Per Fraction: 2.5 Gy
Plan Total Fractions Prescribed: 28
Plan Total Prescribed Dose: 70 Gy
Reference Point Dosage Given to Date: 62.5 Gy
Reference Point Session Dosage Given: 2.5 Gy
Session Number: 25

## 2022-10-20 MED ORDER — TAMSULOSIN HCL 0.4 MG PO CAPS: 0.4000 mg | ORAL_CAPSULE | Freq: Every day | ORAL | 3 refills | Status: DC

## 2022-10-21 ENCOUNTER — Other Ambulatory Visit: Payer: Self-pay

## 2022-10-21 ENCOUNTER — Ambulatory Visit: Admission: RE | Admit: 2022-10-21 | Payer: Medicare HMO | Source: Ambulatory Visit

## 2022-10-21 DIAGNOSIS — Z5111 Encounter for antineoplastic chemotherapy: Secondary | ICD-10-CM | POA: Diagnosis not present

## 2022-10-21 DIAGNOSIS — Z191 Hormone sensitive malignancy status: Secondary | ICD-10-CM | POA: Diagnosis not present

## 2022-10-21 DIAGNOSIS — Z510 Encounter for antineoplastic radiation therapy: Secondary | ICD-10-CM | POA: Diagnosis not present

## 2022-10-21 DIAGNOSIS — C61 Malignant neoplasm of prostate: Secondary | ICD-10-CM | POA: Diagnosis not present

## 2022-10-21 LAB — RAD ONC ARIA SESSION SUMMARY
Course Elapsed Days: 35
Plan Fractions Treated to Date: 26
Plan Prescribed Dose Per Fraction: 2.5 Gy
Plan Total Fractions Prescribed: 28
Plan Total Prescribed Dose: 70 Gy
Reference Point Dosage Given to Date: 65 Gy
Reference Point Session Dosage Given: 2.5 Gy
Session Number: 26

## 2022-10-22 ENCOUNTER — Ambulatory Visit
Admission: RE | Admit: 2022-10-22 | Discharge: 2022-10-22 | Disposition: A | Discharge status: Home / Self Care | Payer: Medicare HMO | Source: Ambulatory Visit | Attending: Radiation Oncology | Admitting: Radiation Oncology

## 2022-10-22 ENCOUNTER — Other Ambulatory Visit: Payer: Self-pay

## 2022-10-22 DIAGNOSIS — Z191 Hormone sensitive malignancy status: Secondary | ICD-10-CM | POA: Diagnosis not present

## 2022-10-22 DIAGNOSIS — Z510 Encounter for antineoplastic radiation therapy: Secondary | ICD-10-CM | POA: Diagnosis not present

## 2022-10-22 DIAGNOSIS — C61 Malignant neoplasm of prostate: Secondary | ICD-10-CM | POA: Diagnosis not present

## 2022-10-22 LAB — RAD ONC ARIA SESSION SUMMARY
Course Elapsed Days: 36
Plan Fractions Treated to Date: 27
Plan Prescribed Dose Per Fraction: 2.5 Gy
Plan Total Fractions Prescribed: 28
Plan Total Prescribed Dose: 70 Gy
Reference Point Dosage Given to Date: 67.5 Gy
Reference Point Session Dosage Given: 2.5 Gy
Session Number: 27

## 2022-10-23 ENCOUNTER — Ambulatory Visit
Admission: RE | Admit: 2022-10-23 | Discharge: 2022-10-23 | Disposition: A | Discharge status: Home / Self Care | Payer: Medicare HMO | Source: Ambulatory Visit | Attending: Radiation Oncology | Admitting: Radiation Oncology

## 2022-10-23 ENCOUNTER — Other Ambulatory Visit: Payer: Self-pay

## 2022-10-23 DIAGNOSIS — Z510 Encounter for antineoplastic radiation therapy: Secondary | ICD-10-CM | POA: Diagnosis not present

## 2022-10-23 DIAGNOSIS — C61 Malignant neoplasm of prostate: Secondary | ICD-10-CM | POA: Diagnosis not present

## 2022-10-23 DIAGNOSIS — Z191 Hormone sensitive malignancy status: Secondary | ICD-10-CM | POA: Diagnosis not present

## 2022-10-23 LAB — RAD ONC ARIA SESSION SUMMARY
Course Elapsed Days: 37
Plan Fractions Treated to Date: 28
Plan Prescribed Dose Per Fraction: 2.5 Gy
Plan Total Fractions Prescribed: 28
Plan Total Prescribed Dose: 70 Gy
Reference Point Dosage Given to Date: 70 Gy
Reference Point Session Dosage Given: 2.5 Gy
Session Number: 28

## 2022-10-24 ENCOUNTER — Emergency Department
Admission: EM | Admit: 2022-10-24 | Discharge: 2022-10-24 | Disposition: A | Discharge status: Home / Self Care | Payer: Medicare HMO | Attending: Emergency Medicine | Admitting: Emergency Medicine

## 2022-10-24 ENCOUNTER — Other Ambulatory Visit: Payer: Self-pay

## 2022-10-24 ENCOUNTER — Emergency Department: Payer: Medicare HMO

## 2022-10-24 ENCOUNTER — Ambulatory Visit: Payer: Medicare HMO

## 2022-10-24 DIAGNOSIS — R918 Other nonspecific abnormal finding of lung field: Secondary | ICD-10-CM | POA: Diagnosis not present

## 2022-10-24 DIAGNOSIS — D72819 Decreased white blood cell count, unspecified: Secondary | ICD-10-CM | POA: Insufficient documentation

## 2022-10-24 DIAGNOSIS — Z1152 Encounter for screening for COVID-19: Secondary | ICD-10-CM | POA: Diagnosis not present

## 2022-10-24 DIAGNOSIS — R55 Syncope and collapse: Secondary | ICD-10-CM

## 2022-10-24 DIAGNOSIS — I2510 Atherosclerotic heart disease of native coronary artery without angina pectoris: Secondary | ICD-10-CM | POA: Diagnosis not present

## 2022-10-24 DIAGNOSIS — R42 Dizziness and giddiness: Secondary | ICD-10-CM | POA: Diagnosis present

## 2022-10-24 LAB — COMPREHENSIVE METABOLIC PANEL
ALT: 14 U/L (ref 0–44)
AST: 20 U/L (ref 15–41)
Albumin: 3.3 g/dL — ABNORMAL LOW (ref 3.5–5.0)
Alkaline Phosphatase: 46 U/L (ref 38–126)
Anion gap: 9 (ref 5–15)
BUN: 23 mg/dL (ref 8–23)
CO2: 22 mmol/L (ref 22–32)
Calcium: 9.5 mg/dL (ref 8.9–10.3)
Chloride: 108 mmol/L (ref 98–111)
Creatinine, Ser: 1.42 mg/dL — ABNORMAL HIGH (ref 0.61–1.24)
GFR, Estimated: 50 mL/min — ABNORMAL LOW (ref >60)
Glucose, Bld: 112 mg/dL — ABNORMAL HIGH (ref 70–99)
Potassium: 4.2 mmol/L (ref 3.5–5.1)
Sodium: 139 mmol/L (ref 135–145)
Total Bilirubin: 0.7 mg/dL (ref 0.3–1.2)
Total Protein: 6.3 g/dL — ABNORMAL LOW (ref 6.5–8.1)

## 2022-10-24 LAB — URINALYSIS, ROUTINE W REFLEX MICROSCOPIC
Bilirubin Urine: NEGATIVE
Glucose, UA: NEGATIVE mg/dL
Hgb urine dipstick: NEGATIVE
Ketones, ur: NEGATIVE mg/dL
Leukocytes,Ua: NEGATIVE
Nitrite: NEGATIVE
Protein, ur: NEGATIVE mg/dL
Specific Gravity, Urine: 1.016 (ref 1.005–1.030)
pH: 7 (ref 5.0–8.0)

## 2022-10-24 LAB — SARS CORONAVIRUS 2 BY RT PCR: SARS Coronavirus 2 by RT PCR: NEGATIVE

## 2022-10-24 LAB — CBC
HCT: 29.4 % — ABNORMAL LOW (ref 39.0–52.0)
Hemoglobin: 9.4 g/dL — ABNORMAL LOW (ref 13.0–17.0)
MCH: 31.6 pg (ref 26.0–34.0)
MCHC: 32 g/dL (ref 30.0–36.0)
MCV: 99 fL (ref 80.0–100.0)
Platelets: 139 10*3/uL — ABNORMAL LOW (ref 150–400)
RBC: 2.97 MIL/uL — ABNORMAL LOW (ref 4.22–5.81)
RDW: 13.2 % (ref 11.5–15.5)
WBC: 2.1 10*3/uL — ABNORMAL LOW (ref 4.0–10.5)
nRBC: 0 % (ref 0.0–0.2)

## 2022-10-24 LAB — TROPONIN I (HIGH SENSITIVITY)
Troponin I (High Sensitivity): 8 ng/L (ref <18)
Troponin I (High Sensitivity): 9 ng/L (ref <18)

## 2022-10-24 MED ORDER — IOHEXOL 350 MG/ML SOLN
75.0000 mL | Freq: Once | INTRAVENOUS | Status: AC | PRN
  Administered 2022-10-24: 75 mL via INTRAVENOUS

## 2022-10-24 MED ORDER — LACTATED RINGERS IV BOLUS
1000.0000 mL | Freq: Once | INTRAVENOUS | Status: AC
  Administered 2022-10-24: 1000 mL via INTRAVENOUS

## 2022-10-24 NOTE — ED Triage Notes (Signed)
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 EMS found pt alert but diaphoretic and BP 83/54. EMS gave 700 cc of NS and BP went to 88/58. EMS vital: HR 87 CBG 148 98.1 F oral 94% RA Pt alert, lethargic, sees three staff members when there were only two. Hx of HoTN

## 2022-10-24 NOTE — Discharge Instructions (Signed)
Please drink plenty of fluids and avoid heat for the next several days.  Return to the emergency department immediately if you have any further episodes of lightheadedness, weakness, any chest pain or any other symptom personally concerning to yourself.  As we discussed your CT scan does show several pulmonary nodules 1 of which appears to have enlarged from your last CT we do recommend that you follow-up with your doctor for repeat CT scan in approximately 3 to 6 months to continue to monitor this.

## 2022-10-24 NOTE — ED Notes (Signed)
Patient states he is feeling better.  Denies pain.

## 2022-10-24 NOTE — ED Notes (Signed)
Patient ambulated without assistance.  No signs of distress.  Ambulated to the bathroom, wife at the bedside.  Okay to discharge.

## 2022-10-24 NOTE — ED Provider Notes (Signed)
[facility] Provider Note    Event Date/Time   First MD Initiated Contact with Patient 10/24/22 1547     (approximate)  History   Chief Complaint: Near Syncope  HPI  Shane Kim is a 77 y.o. male who presents to the emergency department after a near syncopal episode.  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  EMS states upon their arrival they found the patient to be quite diaphoretic with blood pressure 83/54, received 700 cc of normal saline and blood pressure of 99/65 in the emergency department.  Patient states that receiving fluids he feels much better.  Denies any chest pain or shortness of breath at any point.  Denies any headache any weakness or numbness in any point.  Patient denies any recent infectious symptoms no cough congestion fever vomiting diarrhea or urinary symptoms.  Patient recalls the entire event, states he never passed out but he felt very weak and lightheaded and lowered himself to the ground.  Physical Exam   Triage Vital Signs: ED Triage Vitals  Encounter Vitals Group     BP 10/24/22 1542 96/65     Systolic BP Percentile --      Diastolic BP Percentile --      Pulse Rate 10/24/22 1542 73     Resp 10/24/22 1542 (!) 24     Temp 10/24/22 1542 97.7 F (36.5 C)     Temp Source 10/24/22 1542 Oral     SpO2 10/24/22 1542 98 %     Weight 10/24/22 1546 175 lb (79.4 kg)     Height 10/24/22 1546 6\' 2"  (1.88 m)     Head Circumference --      Peak Flow --      Pain Score 10/24/22 1543 0     Pain Loc --      Pain Education --      Exclude from Growth Chart --     Most recent vital signs: Vitals:   10/24/22 1641 10/24/22 1730  BP:  105/77  Pulse: 78 74  Resp: 20 15  Temp:    SpO2: 100% 100%    General: Awake, no distress.  CV:  Good peripheral perfusion.  Regular rate and rhythm  Resp:  Normal effort.  Equal breath sounds bilaterally.   Abd:  No distention.  Soft, nontender.  No rebound or guarding. Other:  Cranial nerves intact, equal grip strength bilaterally, 5/5 strength in all extremities no pronator drift.   ED Results / Procedures / Treatments   EKG  EKG viewed and interpreted by myself shows normal sinus rhythm at 77 bpm with a narrow QRS, normal axis, normal intervals, nonspecific but no concerning ST changes.  No ST elevation.   MEDICATIONS ORDERED IN ED: Medications  lactated ringers bolus 1,000 mL (0 mLs Intravenous Stopped 10/24/22 1703)     IMPRESSION / MDM / ASSESSMENT AND PLAN / ED COURSE  I reviewed the triage vital signs and the nursing notes.  Patient's presentation is most consistent with acute presentation with potential threat to life or bodily function.  Patient presents the emergency department for a near syncopal episode while working outside as a cross guard.  Patient states it was very hot throughout the day today.  He believes he could have got dehydrated.  Patient was diaphoretic with a low blood pressure upon arrival however the patient denies any history of syncope  in the past.  Denies any chest pain.  Will check labs including a CBC chemistry and we will obtain a troponin x 2.  We will obtain a urinalysis and continue to closely monitor.  Given the patient's near syncopal episode we will also check for COVID as precaution although the patient denies any infectious symptoms.  Patient's labs have resulted showing a negative COVID test.  Patient CBC does show a leukopenia with a white blood cell count of 2.1, no old labs for comparison.  Mild anemia with a hemoglobin of 9.4 but again no old labs for comparison.  Patient's urinalysis is normal chemistry shows mild renal insufficiency but again no old labs for comparison.  We will IV hydrate with additional liter of fluid in the emergency department.  We will repeat a troponin and continue to closely monitor.  Patient currently states he feels  well, blood pressure 117/76 pulse rate remains normal 100% on room air.  Patient's family is now here states patient is being treated by oncology.  I discussed the low white blood cell count and family was aware.  I asked that they follow-up with his oncologist.  CTA of the chest is negative for PE.  Does show pulmonary nodules recommend repeat CT in 3 to 6 months to evaluate.  I have discussed this with the patient.  FINAL CLINICAL IMPRESSION(S) / ED DIAGNOSES   Near syncopal episode  Note:  This document was prepared using Dragon voice recognition software and may include unintentional dictation errors.   Minna Antis, MD 10/24/22 [phone]

## 2022-10-26 ENCOUNTER — Other Ambulatory Visit: Payer: Self-pay | Admitting: Primary Care

## 2022-10-26 DIAGNOSIS — E785 Hyperlipidemia, unspecified: Secondary | ICD-10-CM

## 2022-10-28 ENCOUNTER — Ambulatory Visit: Payer: Medicare HMO

## 2022-10-29 ENCOUNTER — Ambulatory Visit: Payer: Medicare HMO

## 2022-10-29 ENCOUNTER — Telehealth: Payer: Self-pay

## 2022-10-29 DIAGNOSIS — E785 Hyperlipidemia, unspecified: Secondary | ICD-10-CM

## 2022-10-29 MED ORDER — ROSUVASTATIN CALCIUM 40 MG PO TABS: 40.0000 mg | ORAL_TABLET | Freq: Every day | ORAL | 0 refills | Status: DC

## 2022-10-29 NOTE — Telephone Encounter (Signed)
Prescription Request  10/29/2022  LOV: Visit date not found  What is the name of the medication or equipment? rosuvastatin (CRESTOR) 40 MG tablet   Have you contacted your pharmacy to request a refill? Yes   Which pharmacy would you like this sent to?   [facility] #[id number] [place], [place] - [address] AT [facility] OF [address] [place] [address] Phone: [phone] Fax: [phone]    Patient notified that their request is being sent to the clinical staff for review and that they should receive a response within 2 business days.   Please advise at Mobile [phone] (mobile)

## 2022-10-29 NOTE — Telephone Encounter (Signed)
Called and spoke with patients wife, she stated the patient was completely out of the pills in the bottle. She is unsure if the bottle is from the fill on 09/09/22 or not. She will check the bottle when she gets home and call us back.

## 2022-10-29 NOTE — Telephone Encounter (Signed)
Patient wife called in and stated that patient has been taking 2 pills a day instead of 1.

## 2022-10-29 NOTE — Telephone Encounter (Signed)
Rosuvastatin was refilled on 09/09/2022 for 90-day supply.  He should have enough medication to last until mid October.  Have him notify us when he is about 2 weeks from running out of his medication and we will refill it to the pharmacy.  He will need CPE scheduled for late April 2025.

## 2022-10-29 NOTE — Telephone Encounter (Signed)
Noted.  Labs ordered. Refill sent to pharmacy. It may be seen to fill in terms of insurance coverage

## 2022-10-29 NOTE — Telephone Encounter (Signed)
Called and spoke with patients spouse, she states he was doing two 40mg  tablets, one in the AM and one before bed. He misread the instructions on the bottle. He did for about 4 weeks Scheduled patient lab appt to come check liver enzymes on 09/06.

## 2022-10-29 NOTE — Addendum Note (Signed)
Addended by: Doreene Nest on: 10/29/2022 04:38 PM   Modules accepted: Orders

## 2022-10-29 NOTE — Telephone Encounter (Addendum)
He should not be taking 2 pills of rosuvastatin 40 mg tablets.  Please verify the dose of rosuvastatin that he has at home.  If he has a 40 mg dose then that is the max dose and he should only be taking 1 tablet once daily.  If he has been taking 2 of the 40 mg tablets (80 mg total), how long has he been doing this?  If more than 1 month then needs lab only appointment to check liver enzymes.   Let me know.

## 2022-10-30 ENCOUNTER — Ambulatory Visit: Payer: Medicare HMO

## 2022-10-31 ENCOUNTER — Other Ambulatory Visit (INDEPENDENT_AMBULATORY_CARE_PROVIDER_SITE_OTHER): Payer: Medicare HMO

## 2022-10-31 ENCOUNTER — Ambulatory Visit: Payer: Medicare HMO

## 2022-10-31 DIAGNOSIS — E785 Hyperlipidemia, unspecified: Secondary | ICD-10-CM | POA: Diagnosis not present

## 2022-10-31 LAB — HEPATIC FUNCTION PANEL
ALT: 17 U/L (ref 0–53)
AST: 21 U/L (ref 0–37)
Albumin: 3.9 g/dL (ref 3.5–5.2)
Alkaline Phosphatase: 69 U/L (ref 39–117)
Bilirubin, Direct: 0.1 mg/dL (ref 0.0–0.3)
Total Bilirubin: 0.7 mg/dL (ref 0.2–1.2)
Total Protein: 7.5 g/dL (ref 6.0–8.3)

## 2022-11-03 ENCOUNTER — Ambulatory Visit: Payer: Medicare HMO

## 2022-11-04 ENCOUNTER — Ambulatory Visit: Payer: Medicare HMO

## 2022-11-05 ENCOUNTER — Ambulatory Visit: Payer: Medicare HMO

## 2022-11-06 ENCOUNTER — Ambulatory Visit: Payer: Medicare HMO

## 2022-11-06 ENCOUNTER — Telehealth: Payer: Self-pay

## 2022-11-06 NOTE — Telephone Encounter (Signed)
Transition Care Management Unsuccessful Follow-up Telephone Call  Date of discharge and from where:  10/24/2022 [facility]  Attempts:  1st Attempt  Reason for unsuccessful TCM follow-up call:  Left voice message  Shane Kim [facility]  [facility], [facility] Resource Care Guide Direct Dial: [phone]  Website: [email or web address]

## 2022-11-06 NOTE — Telephone Encounter (Signed)
Transition Care Management Unsuccessful Follow-up Telephone Call  Date of discharge and from where:  10/24/2022 [facility]  Attempts:  2nd Attempt  Reason for unsuccessful TCM follow-up call:  Left voice message  Miller Edgington [facility]  [facility], [facility] Resource Care Guide Direct Dial: [phone]  Website: [email or web address]

## 2022-11-07 ENCOUNTER — Ambulatory Visit: Payer: Medicare HMO

## 2022-11-10 ENCOUNTER — Ambulatory Visit: Payer: Medicare HMO

## 2022-11-11 ENCOUNTER — Ambulatory Visit: Payer: Medicare HMO

## 2022-11-12 DIAGNOSIS — C61 Malignant neoplasm of prostate: Secondary | ICD-10-CM | POA: Diagnosis not present

## 2022-11-24 ENCOUNTER — Encounter: Payer: Self-pay | Admitting: Radiation Oncology

## 2022-11-24 ENCOUNTER — Ambulatory Visit
Admission: RE | Admit: 2022-11-24 | Discharge: 2022-11-24 | Disposition: A | Discharge status: Home / Self Care | Payer: Medicare HMO | Source: Ambulatory Visit | Attending: Radiation Oncology | Admitting: Radiation Oncology

## 2022-11-24 ENCOUNTER — Other Ambulatory Visit: Payer: Self-pay | Admitting: *Deleted

## 2022-11-24 VITALS — BP 89/66 | HR 87 | Temp 96.8°F | Resp 19 | Wt 200.6 lb

## 2022-11-24 DIAGNOSIS — C61 Malignant neoplasm of prostate: Secondary | ICD-10-CM | POA: Insufficient documentation

## 2022-11-24 DIAGNOSIS — Z5111 Encounter for antineoplastic chemotherapy: Secondary | ICD-10-CM | POA: Diagnosis not present

## 2022-11-24 NOTE — Progress Notes (Signed)
Radiation Oncology Follow up Note  Name: Shane Kim   Date:   11/24/2022 MRN:  [id number] DOB: Jun 08, 1945    This 77 y.o. male presents to the clinic today for 1 month follow-up status post image guided IMRT radiation therapy for Gleason 8 (4+4) in a patient status post laser TUR as well as cryoablation back in 2011 for adenocarcinoma the prostate.  REFERRING PROVIDER: Doreene Nest, NP  HPI: Patient is a 77 year old male now out 1 month having completed radiation therapy to his prostate and pelvic nodes for Gleason 8 (4+4) adenocarcinoma the prostate.  He had previously been treated with TUR by laser as well as cryoablation by Dr. Achilles Dunk.  He is seen today in routine follow-up is doing well specifically denies any increased lower urinary tract symptoms diarrhea or fatigue.  COMPLICATIONS OF TREATMENT: none  FOLLOW UP COMPLIANCE: keeps appointments   PHYSICAL EXAM:  BP (!) 89/66   Pulse 87   Temp (!) 96.8 F (36 C)   Resp 19   Wt 200 lb 9.6 oz (91 kg)   SpO2 99%   BMI 25.76 kg/m  Well-developed well-nourished patient in NAD. HEENT reveals PERLA, EOMI, discs not visualized.  Oral cavity is clear. No oral mucosal lesions are identified. Neck is clear without evidence of cervical or supraclavicular adenopathy. Lungs are clear to A&P. Cardiac examination is essentially unremarkable with regular rate and rhythm without murmur rub or thrill. Abdomen is benign with no organomegaly or masses noted. Motor sensory and DTR levels are equal and symmetric in the upper and lower extremities. Cranial nerves II through XII are grossly intact. Proprioception is intact. No peripheral adenopathy or edema is identified. No motor or sensory levels are noted. Crude visual fields are within normal range.  RADIOLOGY RESULTS: No current films for review  PLAN: Present patient has done well 1 month out from external beam radiation therapy with very little side effects.  Pleased with his overall  progress.  Of asked to see him back in 3 months with a PSA prior to that visit.  Patient knows to call with any concerns.  I would like to take this opportunity to thank you for allowing me to participate in the care of your patient.Carmina Miller, MD

## 2022-11-26 DIAGNOSIS — R351 Nocturia: Secondary | ICD-10-CM | POA: Diagnosis not present

## 2022-11-26 DIAGNOSIS — C61 Malignant neoplasm of prostate: Secondary | ICD-10-CM | POA: Diagnosis not present

## 2022-11-26 DIAGNOSIS — R350 Frequency of micturition: Secondary | ICD-10-CM | POA: Diagnosis not present

## 2022-11-26 DIAGNOSIS — R3915 Urgency of urination: Secondary | ICD-10-CM | POA: Diagnosis not present

## 2022-12-20 ENCOUNTER — Other Ambulatory Visit: Payer: Self-pay | Admitting: Primary Care

## 2022-12-20 DIAGNOSIS — E785 Hyperlipidemia, unspecified: Secondary | ICD-10-CM

## 2022-12-24 DIAGNOSIS — Z5111 Encounter for antineoplastic chemotherapy: Secondary | ICD-10-CM | POA: Diagnosis not present

## 2022-12-24 DIAGNOSIS — C61 Malignant neoplasm of prostate: Secondary | ICD-10-CM | POA: Diagnosis not present

## 2023-01-27 DIAGNOSIS — Z5111 Encounter for antineoplastic chemotherapy: Secondary | ICD-10-CM | POA: Diagnosis not present

## 2023-01-27 DIAGNOSIS — C61 Malignant neoplasm of prostate: Secondary | ICD-10-CM | POA: Diagnosis not present

## 2023-02-18 ENCOUNTER — Other Ambulatory Visit: Payer: Self-pay | Admitting: Primary Care

## 2023-02-18 ENCOUNTER — Other Ambulatory Visit: Payer: Self-pay | Admitting: Cardiology

## 2023-02-18 DIAGNOSIS — I420 Dilated cardiomyopathy: Secondary | ICD-10-CM

## 2023-02-23 ENCOUNTER — Inpatient Hospital Stay: Payer: Medicare HMO | Attending: Radiation Oncology

## 2023-03-03 DIAGNOSIS — Z5111 Encounter for antineoplastic chemotherapy: Secondary | ICD-10-CM | POA: Diagnosis not present

## 2023-03-03 DIAGNOSIS — C61 Malignant neoplasm of prostate: Secondary | ICD-10-CM | POA: Diagnosis not present

## 2023-03-04 ENCOUNTER — Ambulatory Visit: Payer: Medicare HMO | Admitting: Radiation Oncology

## 2023-03-09 ENCOUNTER — Other Ambulatory Visit: Payer: Self-pay | Admitting: Primary Care

## 2023-03-09 DIAGNOSIS — E785 Hyperlipidemia, unspecified: Secondary | ICD-10-CM

## 2023-03-10 NOTE — Telephone Encounter (Signed)
 Spoke to pt, scheduledd cpe for 06/23/23

## 2023-03-10 NOTE — Telephone Encounter (Signed)
 Patient is due for CPE/follow up in late April, this will be required prior to any further refills.  Please schedule, thank you!

## 2023-03-18 ENCOUNTER — Inpatient Hospital Stay: Payer: Medicare HMO

## 2023-03-19 ENCOUNTER — Inpatient Hospital Stay: Payer: Medicare HMO | Attending: Radiation Oncology

## 2023-03-19 DIAGNOSIS — C61 Malignant neoplasm of prostate: Secondary | ICD-10-CM | POA: Diagnosis not present

## 2023-03-19 LAB — CBC (CANCER CENTER ONLY)
HCT: 33.9 % — ABNORMAL LOW (ref 39.0–52.0)
Hemoglobin: 10.9 g/dL — ABNORMAL LOW (ref 13.0–17.0)
MCH: 31.1 pg (ref 26.0–34.0)
MCHC: 32.2 g/dL (ref 30.0–36.0)
MCV: 96.9 fL (ref 80.0–100.0)
Platelet Count: 161 10*3/uL (ref 150–400)
RBC: 3.5 MIL/uL — ABNORMAL LOW (ref 4.22–5.81)
RDW: 13.1 % (ref 11.5–15.5)
WBC Count: 2.2 10*3/uL — ABNORMAL LOW (ref 4.0–10.5)
nRBC: 0 % (ref 0.0–0.2)

## 2023-03-20 LAB — PSA: Prostatic Specific Antigen: 0.06 ng/mL (ref 0.00–4.00)

## 2023-03-23 ENCOUNTER — Telehealth: Payer: Self-pay | Admitting: *Deleted

## 2023-03-23 NOTE — Telephone Encounter (Signed)
The patient's wife called and said that the pt. Middle name T is incorrect and it should be the letter C.she wants this corrected.I sent a message and left message to medical records person to see

## 2023-03-25 ENCOUNTER — Ambulatory Visit
Admission: RE | Admit: 2023-03-25 | Discharge: 2023-03-25 | Disposition: A | Discharge status: Home / Self Care | Payer: Medicare HMO | Source: Ambulatory Visit | Attending: Radiation Oncology | Admitting: Radiation Oncology

## 2023-03-25 ENCOUNTER — Other Ambulatory Visit: Payer: Self-pay | Admitting: *Deleted

## 2023-03-25 ENCOUNTER — Encounter: Payer: Self-pay | Admitting: Radiation Oncology

## 2023-03-25 VITALS — BP 103/77 | HR 62 | Temp 96.5°F | Resp 16 | Ht 74.0 in | Wt 199.6 lb

## 2023-03-25 DIAGNOSIS — Z191 Hormone sensitive malignancy status: Secondary | ICD-10-CM | POA: Diagnosis not present

## 2023-03-25 DIAGNOSIS — Z923 Personal history of irradiation: Secondary | ICD-10-CM | POA: Diagnosis not present

## 2023-03-25 DIAGNOSIS — C61 Malignant neoplasm of prostate: Secondary | ICD-10-CM | POA: Insufficient documentation

## 2023-03-25 MED ORDER — TAMSULOSIN HCL 0.4 MG PO CAPS: 0.4000 mg | ORAL_CAPSULE | Freq: Every day | ORAL | 2 refills | Status: DC

## 2023-03-25 NOTE — Progress Notes (Signed)
Radiation Oncology Follow up Note  Name: Shane Kim   Date:   03/25/2023 MRN:  [id number] DOB: 1946-01-22    This 78 y.o. male presents to the clinic today for 15-month follow-up status post image guided IMRT radiation therapy for Gleason 8 (4+4) adenocarcinoma the prostate status post laser TUR as well as cryoablation back in 2011 for adenocarcinoma the prostate.  REFERRING PROVIDER: Doreene Nest, NP  HPI: Patient is a 78 year old male now seen out for months having completed salvage radiation therapy to his prostate for Gleason 8 adenocarcinoma in a patient who previously received laser TUR as well as cryoablation back in 2011 for adenocarcinoma.  Seen today in routine follow-up he is doing well specifically denies any increased lower urinary tract symptoms diarrhea or fatigue his most recent PSA is 0.06 showing excellent biochemical control..  COMPLICATIONS OF TREATMENT: none  FOLLOW UP COMPLIANCE: keeps appointments   PHYSICAL EXAM:  BP 103/77   Pulse 62   Temp (!) 96.5 F (35.8 C) (Tympanic)   Resp 16   Ht 6\' 2"  (1.88 m)   Wt 199 lb 9.6 oz (90.5 kg)   BMI 25.63 kg/m  Well-developed well-nourished patient in NAD. HEENT reveals PERLA, EOMI, discs not visualized.  Oral cavity is clear. No oral mucosal lesions are identified. Neck is clear without evidence of cervical or supraclavicular adenopathy. Lungs are clear to A&P. Cardiac examination is essentially unremarkable with regular rate and rhythm without murmur rub or thrill. Abdomen is benign with no organomegaly or masses noted. Motor sensory and DTR levels are equal and symmetric in the upper and lower extremities. Cranial nerves II through XII are grossly intact. Proprioception is intact. No peripheral adenopathy or edema is identified. No motor or sensory levels are noted. Crude visual fields are within normal range.  RADIOLOGY RESULTS: No current films for review  PLAN: Present time patient is doing well is under  excellent biochemical control of his prostate cancer.  On pleased with his overall progress.  He has a very low side effect profile.  Of asked to see him back in 6 months with a repeat PSA.  Patient knows to call with any concerns.  I would like to take this opportunity to thank you for allowing me to participate in the care of your patient.Carmina Miller, MD

## 2023-03-30 ENCOUNTER — Other Ambulatory Visit: Payer: Self-pay

## 2023-03-30 DIAGNOSIS — I420 Dilated cardiomyopathy: Secondary | ICD-10-CM

## 2023-03-30 MED ORDER — LOSARTAN POTASSIUM 25 MG PO TABS: 25.0000 mg | ORAL_TABLET | Freq: Every day | ORAL | 0 refills | Status: DC

## 2023-04-09 DIAGNOSIS — Z5111 Encounter for antineoplastic chemotherapy: Secondary | ICD-10-CM | POA: Diagnosis not present

## 2023-04-09 DIAGNOSIS — C61 Malignant neoplasm of prostate: Secondary | ICD-10-CM | POA: Diagnosis not present

## 2023-05-11 DIAGNOSIS — C61 Malignant neoplasm of prostate: Secondary | ICD-10-CM | POA: Diagnosis not present

## 2023-05-18 ENCOUNTER — Other Ambulatory Visit: Payer: Self-pay | Admitting: Cardiology

## 2023-05-18 DIAGNOSIS — I420 Dilated cardiomyopathy: Secondary | ICD-10-CM

## 2023-05-18 MED ORDER — SPIRONOLACTONE 25 MG PO TABS: 12.5000 mg | ORAL_TABLET | Freq: Every day | ORAL | 0 refills | Status: DC

## 2023-05-26 ENCOUNTER — Other Ambulatory Visit: Payer: Self-pay | Admitting: Cardiology

## 2023-06-11 DIAGNOSIS — C61 Malignant neoplasm of prostate: Secondary | ICD-10-CM | POA: Diagnosis not present

## 2023-06-11 DIAGNOSIS — Z5111 Encounter for antineoplastic chemotherapy: Secondary | ICD-10-CM | POA: Diagnosis not present

## 2023-06-13 ENCOUNTER — Other Ambulatory Visit: Payer: Self-pay | Admitting: Cardiology

## 2023-06-13 DIAGNOSIS — I420 Dilated cardiomyopathy: Secondary | ICD-10-CM

## 2023-06-23 ENCOUNTER — Encounter: Payer: Medicare HMO | Admitting: Primary Care

## 2023-06-29 ENCOUNTER — Other Ambulatory Visit: Payer: Self-pay | Admitting: Cardiology

## 2023-06-29 ENCOUNTER — Other Ambulatory Visit: Payer: Self-pay | Admitting: Primary Care

## 2023-06-29 DIAGNOSIS — I420 Dilated cardiomyopathy: Secondary | ICD-10-CM

## 2023-06-30 NOTE — Telephone Encounter (Signed)
 Pt of Dr. Renna Cary. Passed his 3rd attempt. Does Dr. Renna Cary want to refill? Please advise

## 2023-06-30 NOTE — Telephone Encounter (Signed)
Please obtain from PCP

## 2023-07-02 ENCOUNTER — Other Ambulatory Visit: Payer: Self-pay | Admitting: Radiation Oncology

## 2023-07-02 ENCOUNTER — Other Ambulatory Visit: Payer: Self-pay | Admitting: Cardiology

## 2023-07-02 DIAGNOSIS — I420 Dilated cardiomyopathy: Secondary | ICD-10-CM

## 2023-07-02 NOTE — Telephone Encounter (Signed)
 Dr. Dorathy Gals pt. He is passed his 3rd attempt. Does Dr. Renna Cary want to refill? Please advise.

## 2023-07-02 NOTE — Telephone Encounter (Signed)
 Please obtain from PCP - overdue to f/u and no appt scheduled

## 2023-07-10 ENCOUNTER — Telehealth: Payer: Self-pay | Admitting: Primary Care

## 2023-07-10 ENCOUNTER — Telehealth: Payer: Self-pay | Admitting: Cardiology

## 2023-07-10 DIAGNOSIS — I420 Dilated cardiomyopathy: Secondary | ICD-10-CM

## 2023-07-10 MED ORDER — SPIRONOLACTONE 25 MG PO TABS: 12.5000 mg | ORAL_TABLET | Freq: Every day | ORAL | 1 refills | Status: DC

## 2023-07-10 MED ORDER — EZETIMIBE 10 MG PO TABS: 10.0000 mg | ORAL_TABLET | Freq: Every day | ORAL | 1 refills | Status: DC

## 2023-07-10 NOTE — Telephone Encounter (Signed)
 Prescription Request  07/10/2023  LOV: Visit date not found  What is the name of the medication or equipment? losartan  (COZAAR ) 25 MG tablet,    Have you contacted your pharmacy to request a refill? Yes   Which pharmacy would you like this sent to?  [facility] #[id number] [place], [place] - [address] AT [facility] [address] [place] [address] Phone: [phone] Fax: [phone]    Patient notified that their request is being sent to the clinical staff for review and that they should receive a response within 2 business days.   Please advise at Mobile [phone] (mobile)

## 2023-07-10 NOTE — Telephone Encounter (Signed)
*  STAT* If patient is at the pharmacy, call can be transferred to refill team.   1. Which medications need to be refilled? (please list name of each medication and dose if known)   spironolactone  (ALDACTONE ) 25 MG tablet    ezetimibe  (ZETIA ) 10 MG tablet   2. Which pharmacy/location (including street and city if local pharmacy) is medication to be sent to? [facility] #[id number] [place], [place] - [address] AT [facility] OF [address] [facility] Phone: [phone]  Fax: [phone]     3. Do they need a 30 day or 90 day supply? Pt has an appt on 6/20 please refill til then

## 2023-07-10 NOTE — Telephone Encounter (Signed)
 Pt's medications was sent to pt's pharmacy as requested. Confirmation received.

## 2023-07-13 ENCOUNTER — Other Ambulatory Visit: Payer: Self-pay

## 2023-07-13 DIAGNOSIS — I420 Dilated cardiomyopathy: Secondary | ICD-10-CM

## 2023-07-13 NOTE — Telephone Encounter (Signed)
 Patient has not been seen since April 2024. Will need office visit.

## 2023-07-14 NOTE — Telephone Encounter (Signed)
 Spoke to pt's daughter, scheduled ov for 07/16/23

## 2023-07-16 ENCOUNTER — Ambulatory Visit (INDEPENDENT_AMBULATORY_CARE_PROVIDER_SITE_OTHER): Admitting: Primary Care

## 2023-07-16 ENCOUNTER — Encounter: Payer: Self-pay | Admitting: Primary Care

## 2023-07-16 ENCOUNTER — Ambulatory Visit: Payer: Self-pay | Admitting: Primary Care

## 2023-07-16 VITALS — BP 102/58 | HR 75 | Temp 97.0°F | Ht 74.0 in | Wt 198.0 lb

## 2023-07-16 DIAGNOSIS — R7303 Prediabetes: Secondary | ICD-10-CM | POA: Diagnosis not present

## 2023-07-16 DIAGNOSIS — I420 Dilated cardiomyopathy: Secondary | ICD-10-CM

## 2023-07-16 DIAGNOSIS — R351 Nocturia: Secondary | ICD-10-CM

## 2023-07-16 DIAGNOSIS — E785 Hyperlipidemia, unspecified: Secondary | ICD-10-CM

## 2023-07-16 DIAGNOSIS — H6123 Impacted cerumen, bilateral: Secondary | ICD-10-CM | POA: Diagnosis not present

## 2023-07-16 DIAGNOSIS — R3915 Urgency of urination: Secondary | ICD-10-CM

## 2023-07-16 DIAGNOSIS — Z0001 Encounter for general adult medical examination with abnormal findings: Secondary | ICD-10-CM

## 2023-07-16 DIAGNOSIS — Z0000 Encounter for general adult medical examination without abnormal findings: Secondary | ICD-10-CM

## 2023-07-16 DIAGNOSIS — Z8546 Personal history of malignant neoplasm of prostate: Secondary | ICD-10-CM

## 2023-07-16 DIAGNOSIS — H6120 Impacted cerumen, unspecified ear: Secondary | ICD-10-CM | POA: Insufficient documentation

## 2023-07-16 DIAGNOSIS — I5022 Chronic systolic (congestive) heart failure: Secondary | ICD-10-CM

## 2023-07-16 LAB — COMPREHENSIVE METABOLIC PANEL WITH GFR
ALT: 8 U/L (ref 0–53)
AST: 16 U/L (ref 0–37)
Albumin: 4.2 g/dL (ref 3.5–5.2)
Alkaline Phosphatase: 46 U/L (ref 39–117)
BUN: 18 mg/dL (ref 6–23)
CO2: 31 meq/L (ref 19–32)
Calcium: 10 mg/dL (ref 8.4–10.5)
Chloride: 106 meq/L (ref 96–112)
Creatinine, Ser: 1.18 mg/dL (ref 0.40–1.50)
GFR: 58.47 mL/min — ABNORMAL LOW (ref >60.00)
Glucose, Bld: 102 mg/dL — ABNORMAL HIGH (ref 70–99)
Potassium: 4.2 meq/L (ref 3.5–5.1)
Sodium: 141 meq/L (ref 135–145)
Total Bilirubin: 0.6 mg/dL (ref 0.2–1.2)
Total Protein: 7.1 g/dL (ref 6.0–8.3)

## 2023-07-16 LAB — LIPID PANEL
Cholesterol: 163 mg/dL (ref 0–200)
HDL: 63.2 mg/dL (ref >39.00)
LDL Cholesterol: 92 mg/dL (ref 0–99)
NonHDL: 100.1
Total CHOL/HDL Ratio: 3
Triglycerides: 41 mg/dL (ref 0.0–149.0)
VLDL: 8.2 mg/dL (ref 0.0–40.0)

## 2023-07-16 LAB — HEMOGLOBIN A1C: Hgb A1c MFr Bld: 6.1 % (ref 4.6–6.5)

## 2023-07-16 MED ORDER — TAMSULOSIN HCL 0.4 MG PO CAPS: 0.4000 mg | ORAL_CAPSULE | Freq: Every day | ORAL | 3 refills | Status: AC

## 2023-07-16 MED ORDER — ROSUVASTATIN CALCIUM 40 MG PO TABS: 40.0000 mg | ORAL_TABLET | Freq: Every day | ORAL | 3 refills | Status: AC

## 2023-07-16 NOTE — Assessment & Plan Note (Addendum)
 Following with cardiology.  Office notes reviewed from January 2024.  Continue carvedilol  3.125 mg twice daily, losartan  25 mg daily, spironolactone  12.5 mg daily. BP is borderline too low, he will discuss his regimen with cardiology at upcoming visit.

## 2023-07-16 NOTE — Assessment & Plan Note (Signed)
 Bilateral cerumen impaction identified on exam. Patient consented to irrigation of canals bilaterally.  Bilateral canals irrigated. Patient tolerated well. TM's and canals post irrigation unremarkable.   Discussed home care instructions.

## 2023-07-16 NOTE — Assessment & Plan Note (Signed)
 Repeat lipid panel pending. Continue rosuvastatin  40 mg daily, Zetia  10 mg daily.

## 2023-07-16 NOTE — Assessment & Plan Note (Signed)
 Currently active, however has been out of tamsulosin  for months.  Resume tamsulosin  0.4 mg every evening. Refills provided.

## 2023-07-16 NOTE — Assessment & Plan Note (Signed)
 Repeat A1c pending

## 2023-07-16 NOTE — Patient Instructions (Signed)
 Stop by the lab prior to leaving today. I will notify you of your results once received.   Resume tamsulosin  0.4 mg capsules every evening for urinary frequency.  It was a pleasure to see you today!

## 2023-07-16 NOTE — Progress Notes (Addendum)
 Subjective:    Patient ID: Shane Kim, male    DOB: 23-Sep-1945, 78 y.o.   MRN: [id number]  HPI  Shane Kim is a very pleasant 78 y.o. male who presents today for complete physical and follow up of chronic conditions.  His granddaughter joins us  today.  She needs assistance getting into his MyChart portal.  Immunizations: -Shingles: Completed Shingrix series -Pneumonia: Completed Prevnar 13 in 2018, Pneumovax 23 in 2018  Diet: Fair diet.  Exercise: No regular exercise.  Eye exam: Completes annually  Dental exam: Complete annually   Colonoscopy: Completed in 2020,, no further screening needed given age.   PSA: UTD, follows with radiology oncology and urology   BP Readings from Last 3 Encounters:  07/16/23 (!) 102/58  03/25/23 103/77  11/24/22 (!) 89/66      Review of Systems  Constitutional:  Negative for unexpected weight change.  HENT:  Negative for rhinorrhea.   Respiratory:  Negative for cough and shortness of breath.   Cardiovascular:  Negative for chest pain.  Gastrointestinal:  Negative for constipation and diarrhea.  Genitourinary:  Positive for frequency. Negative for difficulty urinating.       Nocturia  Musculoskeletal:  Negative for arthralgias.  Skin:  Negative for rash.  Allergic/Immunologic: Negative for environmental allergies.  Neurological:  Negative for dizziness and headaches.  Psychiatric/Behavioral:  The patient is not nervous/anxious.          Past Medical History:  Diagnosis Date   Acute systolic CHF (congestive heart failure) (HCC)    a. echo 11/21/15: EF <20%, mildly dilated LV, severe diffuse global HK, GR3DD, mod MR, LA mildly dilated, RV sys fxn mildly reduced, PASP 52 mmHg   Bladder outlet obstruction 07/22/2013   CAD (coronary artery disease)    a. cardiac cath 11/22/15: dLM 40%, pLAD 80-90%, mLAD sequential 40%, D2 90%, mLCx 90% at takeoff of OM1, RCA appeared occluded proximally, LVEF 20%, PCWP 29    Chickenpox     as child   Chronic diastolic CHF (congestive heart failure) (HCC)    ED (erectile dysfunction)    Elevated PSA    History of UTI    HLD (hyperlipidemia)    Hypertension    Malignant neoplasm of prostate (HCC) 06/18/2005   Formatting of this note might be different from the original.  Last Assessment & Plan:   Recent PSA of 2.01. continue to monitor.     Pre-diabetes    Prostate cancer (HCC)    Urinary retention 10/16/2021   Urinary urgency    Weak urine stream    Wears glasses     Social History   Socioeconomic History   Marital status: Married    Spouse name: BRENDA   Number of children: 2   Years of education: Not on file   Highest education level: Not on file  Occupational History   Not on file  Tobacco Use   Smoking status: Never   Smokeless tobacco: Never  Vaping Use   Vaping status: Never Used  Substance and Sexual Activity   Alcohol use: Never   Drug use: Never   Sexual activity: Not Currently  Other Topics Concern   Not on file  Social History Narrative   Married. Lives with his wife.   2 children, 2 grandchildren.   Retired. Worked as a Chartered certified accountant.   Enjoys fishing.    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   Social Drivers of Corporate investment banker Strain: Low Risk  (  05/29/2022)   Overall Financial Resource Strain (CARDIA)    Difficulty of Paying Living Expenses: Not hard at all  Food Insecurity: No Food Insecurity (05/29/2022)   Hunger Vital Sign    Worried About Running Out of Food in the Last Year: Never true    Ran Out of Food in the Last Year: Never true  Transportation Needs: No Transportation Needs (05/29/2022)   PRAPARE - Administrator, Civil Service (Medical): No    Lack of Transportation (Non-Medical): No  Physical Activity: Sufficiently Active (05/29/2022)   Exercise Vital Sign    Days of Exercise per Week: 5 days    Minutes of Exercise per Session: 30 min  Stress: No Stress Concern Present (05/29/2022)   [employer] of Occupational Health - Occupational Stress Questionnaire    Feeling of Stress : Not at all  Social Connections: Socially Integrated (05/29/2022)   Social Connection and Isolation Panel [NHANES]    Frequency of Communication with Friends and Family: More than three times a week    Frequency of Social Gatherings with Friends and Family: More than three times a week    Attends Religious Services: More than 4 times per year    Active Member of Clubs or Organizations: Yes    Attends Banker Meetings: More than 4 times per year    Marital Status: Married  Catering manager Violence: Not At Risk (05/29/2022)   Humiliation, Afraid, Rape, and Kick questionnaire    Fear of Current or Ex-Partner: No    Emotionally Abused: No    Physically Abused: No    Sexually Abused: No    Past Surgical History:  Procedure Laterality Date   CARDIAC CATHETERIZATION N/A 11/22/2015   Procedure: Right/Left Heart Cath and Coronary Angiography;  Surgeon: Devorah Fonder, MD;  Location: [facility] INV[facility]IVE [facility];  Service: Cardiovascular;  Laterality: N/A;   CARDIAC CATHETERIZATION Left 11/29/2015   Procedure: LEFT FEMORAL ARTERIAL LINE INSERTION;  Surgeon: Bartley Lightning, MD;  Location: [facility] OR;  Service: Open Heart Surgery;  Laterality: Left;   COLONOSCOPY WITH PROPOFOL  N/A 02/14/2019   Procedure: COLONOSCOPY WITH PROPOFOL ;  Surgeon: Toledo, Alphonsus Jeans, MD;  Location: [facility] ENDOSCOPY;  Service: Gastroenterology;  Laterality: N/A;   CORONARY ARTERY BYPASS GRAFT N/A 11/29/2015   Procedure: CORONARY ARTERY BYPASS GRAFTING (CABG) x four, using left internal mammary artery and right leg saphenous vein harvested endoscopically;  Surgeon: Bartley Lightning, MD;  Location: [facility] OR;  Service: Open Heart Surgery;  Laterality: N/A;   GOLD SEED IMPLANT N/A 09/02/2022   Procedure: GOLD SEED IMPLANT;  Surgeon: Adelbert Homans, MD;  Location: [facility];  Service: Urology;  Laterality: N/A;   PROSTATE  BIOPSY     SPACE OAR INSTILLATION N/A 09/02/2022   Procedure: SPACE OAR INSTILLATION;  Surgeon: Adelbert Homans, MD;  Location: [facility];  Service: Urology;  Laterality: N/A;   TEE WITHOUT CARDIOVERSION N/A 11/29/2015   Procedure: TRANSESOPHAGEAL ECHOCARDIOGRAM (TEE);  Surgeon: Bartley Lightning, MD;  Location: [facility] OR;  Service: Open Heart Surgery;  Laterality: N/A;   TURP VAPORIZATION     prostate 15 yrs ago    Family History  Problem Relation Age of Onset   Hypertension Mother    Healthy Father     No Known Allergies  Current Outpatient Medications on File Prior to Visit  Medication Sig Dispense Refill   aspirin  EC (ASPIRIN  LOW DOSE) 81 MG tablet TAKE 1 TABLET(81 MG) BY  MOUTH DAILY. SWALLOW WHOLE 15 tablet 0   Calcium  Carb-Cholecalciferol (CALCIUM  PLUS VITAMIN D PO) Take by mouth 2 (two) times daily.     carvedilol  (COREG ) 3.125 MG tablet TAKE 1 TABLET(3.125 MG) BY MOUTH TWICE DAILY WITH A MEAL 180 tablet 3   ezetimibe  (ZETIA ) 10 MG tablet Take 1 tablet (10 mg total) by mouth daily. 30 tablet 1   losartan  (COZAAR ) 25 MG tablet Take 1 tablet (25 mg total) by mouth daily. for blood pressure. 90 tablet 0   rosuvastatin  (CRESTOR ) 40 MG tablet TAKE 1 TABLET(40 MG) BY MOUTH DAILY FOR CHOLESTEROL 90 tablet 0   spironolactone  (ALDACTONE ) 25 MG tablet Take 0.5 tablets (12.5 mg total) by mouth daily. 15 tablet 1   No current facility-administered medications on file prior to visit.    BP (!) 102/58   Pulse 75   Temp (!) 97 F (36.1 C) (Temporal)   Ht 6\' 2"  (1.88 m)   Wt 198 lb (89.8 kg)   SpO2 99%   BMI 25.42 kg/m  Objective:   Physical Exam HENT:     Right Ear: Tympanic membrane and ear canal normal. There is impacted cerumen.     Left Ear: Tympanic membrane and ear canal normal. There is impacted cerumen.  Eyes:     Pupils: Pupils are equal, round, and reactive to light.  Cardiovascular:     Rate and Rhythm: Normal rate and regular rhythm.  Pulmonary:      Effort: Pulmonary effort is normal.     Breath sounds: Normal breath sounds.  Abdominal:     General: Bowel sounds are normal.     Palpations: Abdomen is soft.     Tenderness: There is no abdominal tenderness.  Musculoskeletal:        General: Normal range of motion.     Cervical back: Neck supple.  Skin:    General: Skin is warm and dry.  Neurological:     Mental Status: He is alert and oriented to person, place, and time.     Cranial Nerves: No cranial nerve deficit.     Deep Tendon Reflexes:     Reflex Scores:      Patellar reflexes are 2+ on the right side and 2+ on the left side. Psychiatric:        Mood and Affect: Mood normal.           Assessment & Plan:  Preventative health care Assessment & Plan: Immunizations UTD. Colonoscopy N/A given age PSA UTD  Discussed the importance of a healthy diet and regular exercise in order for weight loss, and to reduce the risk of further co-morbidity.  Exam stable. Labs pending.  Follow up in 1 year for repeat physical.    Nocturia Assessment & Plan: Currently active, however has been out of tamsulosin  for months.  Resume tamsulosin  0.4 mg every evening. Refills provided.  Orders: -     Tamsulosin  HCl; Take 1 capsule (0.4 mg total) by mouth daily after supper. For urinary frequency  Dispense: 90 capsule; Refill: 3  Urinary urgency -     Tamsulosin  HCl; Take 1 capsule (0.4 mg total) by mouth daily after supper. For urinary frequency  Dispense: 90 capsule; Refill: 3  Prediabetes Assessment & Plan: Repeat A1c pending.  Orders: -     Hemoglobin A1c  Hyperlipidemia, unspecified hyperlipidemia type Assessment & Plan: Repeat lipid panel pending. Continue rosuvastatin  40 mg daily, Zetia  10 mg daily.  Orders: -     Lipid panel -  Comprehensive metabolic panel with GFR  Congestive dilated cardiomyopathy [facility]) Assessment & Plan: Following with cardiology.  Office notes reviewed from January  2024.  Continue carvedilol  3.125 mg twice daily, losartan  25 mg daily, spironolactone  12.5 mg daily. BP is borderline too low, he will discuss his regimen with cardiology at upcoming visit.   Chronic systolic congestive heart failure (HCC) Assessment & Plan: Appears euvolemic today.  Continue carvedilol  3.125 mg twice daily, losartan  25 mg daily, spironolactone  12.5 mg daily.   History of prostate cancer Assessment & Plan: Following with radiology oncology and urology.  Office notes and PSA reviewed from January 2025. Follow-up around mid July as scheduled.   Bilateral impacted cerumen Assessment & Plan: Bilateral cerumen impaction identified on exam. Patient consented to irrigation of canals bilaterally.  Bilateral canals irrigated. Patient tolerated well. TM's and canals post irrigation unremarkable.   Discussed home care instructions.           Dresden Lozito K Ilham Roughton, NP

## 2023-07-16 NOTE — Assessment & Plan Note (Signed)
 Immunizations UTD. Colonoscopy N/A given age PSA UTD  Discussed the importance of a healthy diet and regular exercise in order for weight loss, and to reduce the risk of further co-morbidity.  Exam stable. Labs pending.  Follow up in 1 year for repeat physical.

## 2023-07-16 NOTE — Assessment & Plan Note (Signed)
 Appears euvolemic today.  Continue carvedilol  3.125 mg twice daily, losartan  25 mg daily, spironolactone  12.5 mg daily.

## 2023-07-16 NOTE — Addendum Note (Signed)
 Addended by: Neesa Knapik K on: 07/16/2023 08:25 AM   Modules accepted: Orders

## 2023-07-16 NOTE — Assessment & Plan Note (Signed)
 Following with radiology oncology and urology.  Office notes and PSA reviewed from January 2025. Follow-up around mid July as scheduled.

## 2023-07-19 ENCOUNTER — Other Ambulatory Visit: Payer: Self-pay | Admitting: Cardiology

## 2023-07-22 ENCOUNTER — Other Ambulatory Visit: Payer: Self-pay

## 2023-07-22 DIAGNOSIS — I420 Dilated cardiomyopathy: Secondary | ICD-10-CM

## 2023-07-22 MED ORDER — LOSARTAN POTASSIUM 25 MG PO TABS: 25.0000 mg | ORAL_TABLET | Freq: Every day | ORAL | 3 refills | Status: DC

## 2023-08-12 ENCOUNTER — Encounter (HOSPITAL_BASED_OUTPATIENT_CLINIC_OR_DEPARTMENT_OTHER): Payer: Self-pay

## 2023-08-12 DIAGNOSIS — C61 Malignant neoplasm of prostate: Secondary | ICD-10-CM | POA: Diagnosis not present

## 2023-08-13 NOTE — Progress Notes (Unsigned)
 Cardiology Office Note    Patient Name: Shane Kim Date of Encounter: 08/13/2023  Primary Care Provider:  Gabriel John, NP Primary Cardiologist:  Dorothye Gathers, MD Primary Electrophysiologist: None   Past Medical History    Past Medical History:  Diagnosis Date   Acute systolic CHF (congestive heart failure) (HCC)    a. echo 11/21/15: EF <20%, mildly dilated LV, severe diffuse global HK, GR3DD, mod MR, LA mildly dilated, RV sys fxn mildly reduced, PASP 52 mmHg   Bladder outlet obstruction 07/22/2013   CAD (coronary artery disease)    a. cardiac cath 11/22/15: dLM 40%, pLAD 80-90%, mLAD sequential 40%, D2 90%, mLCx 90% at takeoff of OM1, RCA appeared occluded proximally, LVEF 20%, PCWP 29    Chickenpox    as child   Chronic diastolic CHF (congestive heart failure) (HCC)    ED (erectile dysfunction)    Elevated PSA    History of UTI    HLD (hyperlipidemia)    Hypertension    Malignant neoplasm of prostate (HCC) 06/18/2005   Formatting of this note might be different from the original.  Last Assessment & Plan:   Recent PSA of 2.01. continue to monitor.     Pre-diabetes    Prostate cancer (HCC)    Urinary retention 10/16/2021   Urinary urgency    Weak urine stream    Wears glasses     History of Present Illness  Shane Kim is a 78 y.o. male with a PMH of CAD s/p CABG x 4 in 2017 (LIMA to LAD sequential SVG to second diagonal and obtuse marginal, SVG to RCA) HFrEF, ICM, HLD, HTN, prostate CA s/p cryoablation, prediabetes who presents today for annual follow-up.  [name] was seen initially in 2017 in the ED at [facility] with complaint of shortness of breath and lower extremity swelling.  He completed a 2D echo that shows severely reduced EF of 20% with severe diffuse global HK with minimal regional variability moderate MR, LAE.  He underwent a R/LHC on 11/22/2015 that showed severe three-vessel disease.  He was transferred to [facility] and evaluated by CVTS and  underwent CABG x 4 on 11/29/2015 utilizing LIMA to LAD sequential SVG to second diagonal and obtuse marginal, SVG to RCA.  He was followed by Dr. Julane Ny in the advanced heart failure clinic and seen initially by Dr. Renna Cary on 04/26/19 with 2D echo updated showing EF of 50%.  He was last seen by Dr. Renna Cary on 03/26/2022 and was doing well overall with no complaints of chest pain and was working as a crossing guard.  Blood pressures were stable but soft with no changes made to medications.  [name] presents today with his daughter for annual follow-up.  He reports no new cardiac complaints since his previous follow-up.  He experiences dizziness, particularly when standing up quickly, which has led to some falls. He recalls a near syncope event in August of the previous year. He is on carvedilol , losartan , and spironolactone  for blood pressure management. He recently resumed losartan  after a lapse in refills. No chest pain or shortness of breath, but he notes occasional episodes of palpitations. He reports frequent urination, needing to urinate every one to two hours. He takes tamsulosin  for prostate issues and spironolactone , which has a diuretic effect. He denies excessive sugar or salt intake and mentions that someone is preparing his meals to manage his diet. 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 No fatigue or dyspnea during these activities. Patient denies chest pain, palpitations, dyspnea, PND, orthopnea, nausea, vomiting, dizziness, syncope, edema, weight gain, or early satiety.  Discussed the use of AI scribe software for clinical note transcription with the patient, who gave verbal consent to proceed.  History of Present Illness   Review of Systems  Please see the history of present illness.    All other systems reviewed and are otherwise negative except as noted above.  Physical Exam    Wt Readings from Last 3 Encounters:  07/16/23 198  lb (89.8 kg)  03/25/23 199 lb 9.6 oz (90.5 kg)  11/24/22 200 lb 9.6 oz (91 kg)   ZO:[id number] were no vitals filed for this visit.,There is no height or weight on file to calculate BMI. GEN: Well nourished, well developed in no acute distress Neck: No JVD; No carotid bruits Pulmonary: Clear to auscultation without rales, wheezing or rhonchi  Cardiovascular: Normal rate. Regular rhythm. Normal S1. Normal S2.   Murmurs: There is no murmur.  ABDOMEN: Soft, non-tender, non-distended EXTREMITIES:  No edema; No deformity   EKG/LABS/ Recent Cardiac Studies   ECG personally reviewed by me today -none completed today  Risk Assessment/Calculations:          Lab Results  Component Value Date   WBC 2.2 (L) 03/19/2023   HGB 10.9 (L) 03/19/2023   HCT 33.9 (L) 03/19/2023   MCV 96.9 03/19/2023   PLT 161 03/19/2023   Lab Results  Component Value Date   CREATININE 1.18 07/16/2023   BUN 18 07/16/2023   NA 141 07/16/2023   K 4.2 07/16/2023   CL 106 07/16/2023   CO2 31 07/16/2023   Lab Results  Component Value Date   CHOL 163 07/16/2023   HDL 63.20 07/16/2023   LDLCALC 92 07/16/2023   TRIG 41.0 07/16/2023   CHOLHDL 3 07/16/2023    Lab Results  Component Value Date   HGBA1C 6.1 07/16/2023   Assessment & Plan    Assessment & Plan   1.  HFrEF/ICM: - Patient is euvolemic on examination today with previous echo completed 04/2021 showing EF of 45 to 50% - Patient reports dizziness and will discontinue losartan  25 mg - Continue Coreg  3.125 mg twice daily, spironolactone  12.5 mg daily - Repeat 2D echo to evaluate LV function -Low sodium diet, fluid restriction <2L, and daily weights encouraged. Educated to contact our office for weight gain of 2 lbs overnight or 5 lbs in one week.   2.  Coronary artery disease: -s/p CABG x 4 in 2017 (LIMA to LAD sequential SVG to second diagonal and obtuse marginal, SVG to RCA)  - Patient reports no chest pain or angina since previous visit. -Continue  ASA 81 mg, Zetia  10 mg, Crestor  40 mg and carvedilol  3.125 mg twice daily  3.  Essential hypertension: -Low blood pressure potentially contributing to dizziness and syncope. Medication regimen adjusted to address hypotension. - Discontinue losartan . - Monitor blood pressure at home with a blood pressure cuff. - Provide prescription for blood pressure cuff.  4.  Hyperlipidemia: - Patient's last LDL cholesterol was elevated at 92 - Patient will continue Zetia  10 mg as well as Crestor  40 mg with plan to switch to atorvastatin  if numbers are elevated at next check.  5. Dizziness and pre-syncope: Recurrent dizziness and near syncope likely related to antihypertensive medications. Tamsulosin  timing adjustment and losartan  discontinuation planned to evaluate symptom impact. - Discontinue losartan . - Continue spironolactone . - Advise taking tamsulosin  at night  to reduce daytime dizziness.  Disposition: Follow-up with Dorothye Gathers, MD or APP in 12 months    Signed, Francene Ing, Retha Cast, NP 08/13/2023, 7:43 PM [facility]

## 2023-08-14 ENCOUNTER — Ambulatory Visit: Attending: Nurse Practitioner | Admitting: Nurse Practitioner

## 2023-08-14 ENCOUNTER — Encounter: Payer: Self-pay | Admitting: Nurse Practitioner

## 2023-08-14 ENCOUNTER — Other Ambulatory Visit (HOSPITAL_COMMUNITY): Payer: Self-pay

## 2023-08-14 VITALS — BP 104/66 | HR 80 | Ht 72.0 in | Wt 191.8 lb

## 2023-08-14 DIAGNOSIS — I10 Essential (primary) hypertension: Secondary | ICD-10-CM

## 2023-08-14 DIAGNOSIS — I5022 Chronic systolic (congestive) heart failure: Secondary | ICD-10-CM

## 2023-08-14 DIAGNOSIS — R42 Dizziness and giddiness: Secondary | ICD-10-CM | POA: Diagnosis not present

## 2023-08-14 DIAGNOSIS — I2510 Atherosclerotic heart disease of native coronary artery without angina pectoris: Secondary | ICD-10-CM | POA: Diagnosis not present

## 2023-08-14 DIAGNOSIS — E7800 Pure hypercholesterolemia, unspecified: Secondary | ICD-10-CM | POA: Diagnosis not present

## 2023-08-14 MED ORDER — OMRON 3 SERIES BP MONITOR DEVI
1.0000 | Freq: Every day | 0 refills | Status: AC
  Filled 2023-08-14: qty 1, 30d supply, fill #0

## 2023-08-14 NOTE — Patient Instructions (Signed)
 Medication Instructions:  STOP Losartan   TAKE Flomax  daily in the evening  *If you need a refill on your cardiac medications before your next appointment, please call your pharmacy*  Lab Work: None ordered  If you have labs (blood work) drawn today and your tests are completely normal, you will receive your results only by: MyChart Message (if you have MyChart) OR A paper copy in the mail If you have any lab test that is abnormal or we need to change your treatment, we will call you to review the results.  Testing/Procedures: None   Follow-Up: At [facility], you and your health needs are our priority.  As part of our continuing mission to provide you with exceptional heart care, our providers are all part of one team.  This team includes your primary Cardiologist (physician) and Advanced Practice Providers or APPs (Physician Assistants and Nurse Practitioners) who all work together to provide you with the care you need, when you need it.  Your next appointment:   12 month(s)  Provider:   Dorothye Gathers, MD    We recommend signing up for the patient portal called MyChart.  Sign up information is provided on this After Visit Summary.  MyChart is used to connect with patients for Virtual Visits (Telemedicine).  Patients are able to view lab/test results, encounter notes, upcoming appointments, etc.  Non-urgent messages can be sent to your provider as well.   To learn more about what you can do with MyChart, go to [email or web address].   Other Instructions

## 2023-08-19 DIAGNOSIS — C61 Malignant neoplasm of prostate: Secondary | ICD-10-CM | POA: Diagnosis not present

## 2023-08-19 DIAGNOSIS — R350 Frequency of micturition: Secondary | ICD-10-CM | POA: Diagnosis not present

## 2023-08-19 DIAGNOSIS — R351 Nocturia: Secondary | ICD-10-CM | POA: Diagnosis not present

## 2023-08-22 ENCOUNTER — Other Ambulatory Visit: Payer: Self-pay | Admitting: Cardiology

## 2023-09-14 ENCOUNTER — Inpatient Hospital Stay: Payer: Medicare HMO | Attending: Radiation Oncology

## 2023-09-14 ENCOUNTER — Other Ambulatory Visit: Payer: Self-pay | Admitting: *Deleted

## 2023-09-14 DIAGNOSIS — C61 Malignant neoplasm of prostate: Secondary | ICD-10-CM | POA: Insufficient documentation

## 2023-09-14 LAB — PSA: Prostatic Specific Antigen: 0.01 ng/mL (ref 0.00–4.00)

## 2023-09-16 ENCOUNTER — Other Ambulatory Visit: Payer: Self-pay | Admitting: Cardiology

## 2023-09-16 DIAGNOSIS — I420 Dilated cardiomyopathy: Secondary | ICD-10-CM

## 2023-09-21 ENCOUNTER — Encounter: Payer: Self-pay | Admitting: Radiation Oncology

## 2023-09-21 ENCOUNTER — Ambulatory Visit
Admission: RE | Admit: 2023-09-21 | Discharge: 2023-09-21 | Disposition: A | Discharge status: Home / Self Care | Payer: Medicare HMO | Source: Ambulatory Visit | Attending: Radiation Oncology | Admitting: Radiation Oncology

## 2023-09-21 ENCOUNTER — Other Ambulatory Visit: Payer: Self-pay | Admitting: *Deleted

## 2023-09-21 VITALS — BP 109/76 | HR 76 | Temp 98.3°F | Resp 16 | Wt 191.0 lb

## 2023-09-21 DIAGNOSIS — C61 Malignant neoplasm of prostate: Secondary | ICD-10-CM | POA: Insufficient documentation

## 2023-09-21 DIAGNOSIS — Z923 Personal history of irradiation: Secondary | ICD-10-CM | POA: Insufficient documentation

## 2023-09-21 DIAGNOSIS — Z191 Hormone sensitive malignancy status: Secondary | ICD-10-CM | POA: Diagnosis not present

## 2023-09-21 NOTE — Progress Notes (Signed)
 Radiation Oncology Follow up Note  Name: Shane Kim   Date:   09/21/2023 MRN:  [id number] DOB: [birth year]    This 78 y.o. male presents to the clinic today for 87-month follow-up status post image guided IMRT radiation therapy to his prostate for Gleason 8 (4+4) adenocarcinoma inpatient status post laser TUR as well as cryoablation in 2011.  REFERRING PROVIDER: Gretta Comer POUR, NP  HPI: Patient is a 78 year old male now out 10 months having completed radiation therapy image guided to his prostate for Gleason 8 adenocarcinoma in a patient status post laser TUR as well as cryoablation back in 2011 seen today in routine follow-up he is doing well fairly asymptomatic specifically denies any increased lower urinary tract symptoms diarrhea or fatigue his most recent PSA is 0.02.SABRA  Patient is not on ADT therapy  COMPLICATIONS OF TREATMENT: none  FOLLOW UP COMPLIANCE: keeps appointments   PHYSICAL EXAM:  BP 109/76   Pulse 76   Temp 98.3 F (36.8 C) (Tympanic)   Resp 16   Wt 191 lb (86.6 kg)   BMI 25.90 kg/m  Well-developed well-nourished patient in NAD. HEENT reveals PERLA, EOMI, discs not visualized.  Oral cavity is clear. No oral mucosal lesions are identified. Neck is clear without evidence of cervical or supraclavicular adenopathy. Lungs are clear to A&P. Cardiac examination is essentially unremarkable with regular rate and rhythm without murmur rub or thrill. Abdomen is benign with no organomegaly or masses noted. Motor sensory and DTR levels are equal and symmetric in the upper and lower extremities. Cranial nerves II through XII are grossly intact. Proprioception is intact. No peripheral adenopathy or edema is identified. No motor or sensory levels are noted. Crude visual fields are within normal range.  RADIOLOGY RESULTS: No current films for review  PLAN: The present time patient is doing well very low side effect profile under excellent biochemical control of his prostate  cancer.  Of asked to see him back in 6 months for follow-up with a repeat PSA.  Patient knows to call anytime with any concerns.  I would like to take this opportunity to thank you for allowing me to participate in the care of your patient.SABRA Marcey Penton, MD

## 2023-10-01 ENCOUNTER — Ambulatory Visit

## 2023-10-23 DIAGNOSIS — H524 Presbyopia: Secondary | ICD-10-CM | POA: Diagnosis not present

## 2023-11-02 DIAGNOSIS — H25813 Combined forms of age-related cataract, bilateral: Secondary | ICD-10-CM | POA: Diagnosis not present

## 2023-11-11 DIAGNOSIS — C61 Malignant neoplasm of prostate: Secondary | ICD-10-CM | POA: Diagnosis not present

## 2023-11-12 ENCOUNTER — Other Ambulatory Visit: Payer: Self-pay | Admitting: Cardiology

## 2023-11-12 DIAGNOSIS — I5022 Chronic systolic (congestive) heart failure: Secondary | ICD-10-CM

## 2023-11-12 DIAGNOSIS — I2510 Atherosclerotic heart disease of native coronary artery without angina pectoris: Secondary | ICD-10-CM

## 2023-11-12 DIAGNOSIS — E7800 Pure hypercholesterolemia, unspecified: Secondary | ICD-10-CM

## 2023-11-12 DIAGNOSIS — Z951 Presence of aortocoronary bypass graft: Secondary | ICD-10-CM

## 2023-11-18 DIAGNOSIS — E349 Endocrine disorder, unspecified: Secondary | ICD-10-CM | POA: Diagnosis not present

## 2023-11-18 DIAGNOSIS — R3915 Urgency of urination: Secondary | ICD-10-CM | POA: Diagnosis not present

## 2023-11-18 DIAGNOSIS — R351 Nocturia: Secondary | ICD-10-CM | POA: Diagnosis not present

## 2023-11-18 DIAGNOSIS — R350 Frequency of micturition: Secondary | ICD-10-CM | POA: Diagnosis not present

## 2023-11-18 DIAGNOSIS — Z8546 Personal history of malignant neoplasm of prostate: Secondary | ICD-10-CM | POA: Diagnosis not present

## 2023-11-27 DIAGNOSIS — H269 Unspecified cataract: Secondary | ICD-10-CM | POA: Diagnosis not present

## 2023-11-27 DIAGNOSIS — H25812 Combined forms of age-related cataract, left eye: Secondary | ICD-10-CM | POA: Diagnosis not present

## 2023-12-18 DIAGNOSIS — H25811 Combined forms of age-related cataract, right eye: Secondary | ICD-10-CM | POA: Diagnosis not present

## 2023-12-18 DIAGNOSIS — H269 Unspecified cataract: Secondary | ICD-10-CM | POA: Diagnosis not present

## 2023-12-23 ENCOUNTER — Encounter: Admitting: Primary Care

## 2023-12-30 ENCOUNTER — Encounter: Admitting: Primary Care

## 2023-12-30 DIAGNOSIS — H2511 Age-related nuclear cataract, right eye: Secondary | ICD-10-CM | POA: Diagnosis not present

## 2024-01-05 ENCOUNTER — Ambulatory Visit (INDEPENDENT_AMBULATORY_CARE_PROVIDER_SITE_OTHER)

## 2024-01-05 VITALS — Ht 72.0 in | Wt 191.0 lb

## 2024-01-05 DIAGNOSIS — Z0000 Encounter for general adult medical examination without abnormal findings: Secondary | ICD-10-CM | POA: Diagnosis not present

## 2024-01-05 NOTE — Patient Instructions (Addendum)
 Shane Kim,  Thank you for taking the time for your Medicare Wellness Visit. I appreciate your continued commitment to your health goals. Please review the care plan we discussed, and feel free to reach out if I can assist you further.  Please note that Annual Wellness Visits do not include a physical exam. Some assessments may be limited, especially if the visit was conducted virtually. If needed, we may recommend an in-person follow-up with your provider.  Ongoing Care Seeing your primary care provider every 3 to 6 months helps us  monitor your health and provide consistent, personalized care.   Referrals If a referral was made during today's visit and you haven't received any updates within two weeks, please contact the referred provider directly to check on the status.  Recommended Screenings:  Health Maintenance  Topic Date Due   Flu Shot  09/25/2023   COVID-19 Vaccine (6 - 2025-26 season) 10/26/2023   Medicare Annual Wellness Visit  01/04/2025   Pneumococcal Vaccine for age over 58  Completed   Zoster (Shingles) Vaccine  Completed   Meningitis B Vaccine  Aged Out   DTaP/Tdap/Td vaccine  Discontinued   Colon Cancer Screening  Discontinued   Hepatitis C Screening  Discontinued       01/05/2024    9:46 AM  Advanced Directives  Does Patient Have a Medical Advance Directive? No  Would patient like information on creating a medical advance directive? No - Patient declined    Vision: Annual vision screenings are recommended for early detection of glaucoma, cataracts, and diabetic retinopathy. These exams can also reveal signs of chronic conditions such as diabetes and high blood pressure.  Dental: Annual dental screenings help detect early signs of oral cancer, gum disease, and other conditions linked to overall health, including heart disease and diabetes.

## 2024-01-05 NOTE — Progress Notes (Signed)
 Subjective:   Shane Kim is a 78 y.o. male who presents for a Medicare Annual Wellness Visit.  I connected with  Shane Kim on 01/05/24 by a audio enabled telemedicine application and verified that I am speaking with the correct person using two identifiers.  Patient Location: Home  Provider Location: Office/Clinic  Persons Participating in Visit: Patient.  I discussed the limitations of evaluation and management by telemedicine. The patient expressed understanding and agreed to proceed.  Vital Signs: Because this visit was a virtual/telehealth visit, some criteria may be missing or patient reported. Any vitals not documented were not able to be obtained and vitals that have been documented are patient reported.   Allergies (verified) Patient has no known allergies.   History: Past Medical History:  Diagnosis Date   Acute systolic CHF (congestive heart failure) (HCC)    a. echo 11/21/15: EF <20%, mildly dilated LV, severe diffuse global HK, GR3DD, mod MR, LA mildly dilated, RV sys fxn mildly reduced, PASP 52 mmHg   Bladder outlet obstruction 07/22/2013   CAD (coronary artery disease)    a. cardiac cath 11/22/15: dLM 40%, pLAD 80-90%, mLAD sequential 40%, D2 90%, mLCx 90% at takeoff of OM1, RCA appeared occluded proximally, LVEF 20%, PCWP 29    Chickenpox    as child   Chronic diastolic CHF (congestive heart failure) (HCC)    ED (erectile dysfunction)    Elevated PSA    History of UTI    HLD (hyperlipidemia)    Hypertension    Malignant neoplasm of prostate (HCC) 06/18/2005   Formatting of this note might be different from the original.  Last Assessment & Plan:   Recent PSA of 2.01. continue to monitor.     Pre-diabetes    Prostate cancer (HCC)    Urinary retention 10/16/2021   Urinary urgency    Weak urine stream    Wears glasses    Past Surgical History:  Procedure Laterality Date   CARDIAC CATHETERIZATION N/A 11/22/2015   Procedure: Right/Left Heart  Cath and Coronary Angiography;  Surgeon: Evalene JINNY Lunger, MD;  Location: [facility];  Service: Cardiovascular;  Laterality: N/A;   CARDIAC CATHETERIZATION Left 11/29/2015   Procedure: LEFT FEMORAL ARTERIAL LINE INSERTION;  Surgeon: Dorise MARLA Fellers, MD;  Location: [facility] OR;  Service: Open Heart Surgery;  Laterality: Left;   COLONOSCOPY WITH PROPOFOL  N/A 02/14/2019   Procedure: COLONOSCOPY WITH PROPOFOL ;  Surgeon: Toledo, Ladell MARLA, MD;  Location: [facility];  Service: Gastroenterology;  Laterality: N/A;   CORONARY ARTERY BYPASS GRAFT N/A 11/29/2015   Procedure: CORONARY ARTERY BYPASS GRAFTING (CABG) x four, using left internal mammary artery and right leg saphenous vein harvested endoscopically;  Surgeon: Dorise MARLA Fellers, MD;  Location: [facility] OR;  Service: Open Heart Surgery;  Laterality: N/A;   GOLD SEED IMPLANT N/A 09/02/2022   Procedure: GOLD SEED IMPLANT;  Surgeon: Devere Lonni Righter, MD;  Location: [facility];  Service: Urology;  Laterality: N/A;   PROSTATE BIOPSY     SPACE OAR INSTILLATION N/A 09/02/2022   Procedure: SPACE OAR INSTILLATION;  Surgeon: Devere Lonni Righter, MD;  Location: [facility];  Service: Urology;  Laterality: N/A;   TEE WITHOUT CARDIOVERSION N/A 11/29/2015   Procedure: TRANSESOPHAGEAL ECHOCARDIOGRAM (TEE);  Surgeon: Dorise MARLA Fellers, MD;  Location: [facility] OR;  Service: Open Heart Surgery;  Laterality: N/A;   TURP VAPORIZATION     prostate 15 yrs ago   Family History  Problem Relation Age  of Onset   Hypertension Mother    Healthy Father    Social History   Occupational History   Not on file  Tobacco Use   Smoking status: Never   Smokeless tobacco: Never  Vaping Use   Vaping status: Never Used  Substance and Sexual Activity   Alcohol use: Not Currently   Drug use: Never   Sexual activity: Not Currently   Tobacco Counseling Counseling given: Not Answered  SDOH Screenings   Food Insecurity: No Food Insecurity  (01/05/2024)  Housing: Unknown (01/05/2024)  Transportation Needs: No Transportation Needs (01/05/2024)  Utilities: Not At Risk (01/05/2024)  Alcohol Screen: Low Risk  (05/29/2022)  Depression (PHQ2-9): Low Risk  (01/05/2024)  Financial Resource Strain: Low Risk  (05/29/2022)  Physical Activity: Sufficiently Active (01/05/2024)  Social Connections: Socially Integrated (01/05/2024)  Stress: No Stress Concern Present (01/05/2024)  Tobacco Use: Low Risk  (01/05/2024)  Health Literacy: Adequate Health Literacy (01/05/2024)   Depression Screen    01/05/2024    9:53 AM 09/21/2023    9:47 AM 07/16/2023    8:05 AM 06/13/2022    9:08 AM 05/29/2022    8:32 AM 10/02/2021   12:07 PM 05/29/2021   11:09 AM  PHQ 2/9 Scores  PHQ - 2 Score 0 0 0 0 0 0 0  PHQ- 9 Score 0           Goals Addressed               This Visit's Progress     Patient Stated (pt-stated)        Patient stated he plans to continue to stay active and take meds daily       Visit info / Clinical Intake: Medicare Wellness Visit Type:: Subsequent Annual Wellness Visit Persons participating in visit:: patient Medicare Wellness Visit Mode:: Telephone If telephone:: video declined Because this visit was a virtual/telehealth visit:: vitals recorded from last visit If Telephone or Video please confirm:: I connected with the patient using audio enabled telemedicine application and verified that I am speaking with the correct person using two identifiers; I discussed the limitations of evaluation and management by telemedicine; The patient expressed understanding and agreed to proceed Patient Location:: Home Provider Location:: Office Information given by:: patient Interpreter Needed?: No Pre-visit prep was completed: yes AWV questionnaire completed by patient prior to visit?: no Living arrangements:: lives with spouse/significant other Patient's Overall Health Status Rating: good Typical amount of pain: none Does pain affect  daily life?: no Are you currently prescribed opioids?: no  Dietary Habits and Nutritional Risks How many meals a day?: 3 Eats fruit and vegetables daily?: yes Most meals are obtained by: preparing own meals; having others provide food In the last 2 weeks, have you had any of the following?: none Diabetic:: no  Functional Status Activities of Daily Living (to include ambulation/medication): Independent Ambulation: Independent with device- listed below Home Assistive Devices/Equipment: None Medication Administration: Independent Home Management: Independent Manage your own finances?: yes Primary transportation is: driving; family/friends Concerns about vision?: no *vision screening is required for WTM* Concerns about hearing?: no  Fall Screening Falls in the past year?: 0 Number of falls in past year: 0 Was there an injury with Fall?: 0 Fall Risk Category Calculator: 0 Patient Fall Risk Level: Low Fall Risk  Fall Risk Patient at Risk for Falls Due to: No Fall Risks Fall risk Follow up: Falls evaluation completed; Falls prevention discussed  Home and Transportation Safety: All rugs have non-skid backing?: yes All  stairs or steps have railings?: yes Grab bars in the bathtub or shower?: (!) no Have non-skid surface in bathtub or shower?: yes Good home lighting?: yes Regular seat belt use?: yes Hospital stays in the last year:: no  Cognitive Assessment Difficulty concentrating, remembering, or making decisions? : no Will 6CIT or Mini Cog be Completed: yes What year is it?: 0 points What month is it?: 0 points Give patient an address phrase to remember (5 components): [address] [place], [place] About what time is it?: 0 points Count backwards from 20 to 1: 0 points Say the months of the year in reverse: 0 points Repeat the address phrase from earlier: 10 points (unable to recall) 6 CIT Score: 10 points  Advance Directives (For Healthcare) Does Patient Have a Medical  Advance Directive?: No Would patient like information on creating a medical advance directive?: No - Patient declined  Reviewed/Updated  Reviewed/Updated: Reviewed All (Medical, Surgical, Family, Medications, Allergies, Care Teams, Patient Goals)        Objective:    Today's Vitals   01/05/24 0944  Weight: 191 lb (86.6 kg)  Height: 6' (1.829 m)   Body mass index is 25.9 kg/m.  Current Medications (verified) Outpatient Encounter Medications as of 01/05/2024  Medication Sig   aspirin  EC (ASPIRIN  LOW DOSE) 81 MG tablet TAKE 1 TABLET(81 MG) BY MOUTH DAILY. SWALLOW WHOLE   Blood Pressure Monitoring (OMRON 3 SERIES BP MONITOR) DEVI Use as directed one daily.   Calcium  Carb-Cholecalciferol (CALCIUM  PLUS VITAMIN D PO) Take by mouth 2 (two) times daily.   carvedilol  (COREG ) 3.125 MG tablet TAKE 1 TABLET(3.125 MG) BY MOUTH TWICE DAILY WITH A MEAL   ezetimibe  (ZETIA ) 10 MG tablet TAKE 1 TABLET BY MOUTH EVERY DAY   rosuvastatin  (CRESTOR ) 40 MG tablet Take 1 tablet (40 mg total) by mouth daily. for cholesterol.   spironolactone  (ALDACTONE ) 25 MG tablet TAKE 1/2 TABLET BY MOUTH EVERY DAY   tamsulosin  (FLOMAX ) 0.4 MG CAPS capsule Take 1 capsule (0.4 mg total) by mouth daily after supper. For urinary frequency   No facility-administered encounter medications on file as of 01/05/2024.   Hearing/Vision screen Hearing Screening - Comments:: Denies hearing difficulties   Vision Screening - Comments:: Wears rx glasses - up to date with routine eye exams  Immunizations and Health Maintenance Health Maintenance  Topic Date Due   Influenza Vaccine  09/25/2023   COVID-19 Vaccine (6 - 2025-26 season) 10/26/2023   Medicare Annual Wellness (AWV)  01/04/2025   Pneumococcal Vaccine: 50+ Years  Completed   Zoster Vaccines- Shingrix  Completed   Meningococcal B Vaccine  Aged Out   DTaP/Tdap/Td  Discontinued   Colonoscopy  Discontinued   Hepatitis C Screening  Discontinued        Assessment/Plan:   This is a routine wellness examination for Shane Kim.  Patient Care Team: Gretta Comer POUR, NP as PCP - General (Internal Medicine) Jeffrie Oneil BROCKS, MD as PCP - Cardiology (Cardiology) Lenn Aran, MD as Consulting Physician (Radiation Oncology) Gretta Comer POUR, NP (Internal Medicine)  I have personally reviewed and noted the following in the patient's chart:   Medical and social history Use of alcohol, tobacco or illicit drugs  Current medications and supplements including opioid prescriptions. Functional ability and status Nutritional status Physical activity Advanced directives List of other physicians Hospitalizations, surgeries, and ER visits in previous 12 months Vitals Screenings to include cognitive, depression, and falls Referrals and appointments  No orders of the defined types were placed in  this encounter.  In addition, I have reviewed and discussed with patient certain preventive protocols, quality metrics, and best practice recommendations. A written personalized care plan for preventive services as well as general preventive health recommendations were provided to patient.   Shane Kim, CMA   01/05/2024   Return in 1 year (on 01/04/2025).  After Visit Summary: (MyChart) Due to this being a telephonic visit, the after visit summary with patients personalized plan was offered to patient via MyChart   Nurse Notes: Scheduled 2026 AWV/CPE appts.

## 2024-01-29 ENCOUNTER — Encounter: Admitting: Primary Care

## 2024-02-12 ENCOUNTER — Encounter: Admitting: Primary Care

## 2024-02-12 ENCOUNTER — Ambulatory Visit (INDEPENDENT_AMBULATORY_CARE_PROVIDER_SITE_OTHER)

## 2024-02-12 ENCOUNTER — Encounter: Payer: Self-pay | Admitting: Primary Care

## 2024-02-12 DIAGNOSIS — Z23 Encounter for immunization: Secondary | ICD-10-CM | POA: Diagnosis not present

## 2024-02-12 NOTE — Progress Notes (Signed)
 Per orders of Mallie Gaskins, DPN AGNP-C, injection of High Dose Flu Vaccine given by Nellie Hummer in Left deltoid Patient tolerated injection well.

## 2024-02-14 NOTE — Progress Notes (Signed)
Scheduling error

## 2024-03-16 ENCOUNTER — Other Ambulatory Visit: Payer: Self-pay | Admitting: *Deleted

## 2024-03-16 ENCOUNTER — Inpatient Hospital Stay: Attending: Radiation Oncology

## 2024-03-16 ENCOUNTER — Other Ambulatory Visit: Payer: Self-pay

## 2024-03-16 DIAGNOSIS — C61 Malignant neoplasm of prostate: Secondary | ICD-10-CM | POA: Diagnosis present

## 2024-03-16 LAB — PSA: Prostatic Specific Antigen: 0.03 ng/mL (ref 0.00–4.00)

## 2024-03-16 LAB — CBC (CANCER CENTER ONLY)
HCT: 41.1 % (ref 39.0–52.0)
Hemoglobin: 13.6 g/dL (ref 13.0–17.0)
MCH: 32.1 pg (ref 26.0–34.0)
MCHC: 33.1 g/dL (ref 30.0–36.0)
MCV: 96.9 fL (ref 80.0–100.0)
Platelet Count: 172 K/uL (ref 150–400)
RBC: 4.24 MIL/uL (ref 4.22–5.81)
RDW: 12.6 % (ref 11.5–15.5)
WBC Count: 2.4 K/uL — ABNORMAL LOW (ref 4.0–10.5)
nRBC: 0 % (ref 0.0–0.2)

## 2024-03-23 ENCOUNTER — Other Ambulatory Visit: Payer: Self-pay | Admitting: *Deleted

## 2024-03-23 ENCOUNTER — Ambulatory Visit
Admission: RE | Admit: 2024-03-23 | Discharge: 2024-03-23 | Disposition: A | Discharge status: Home / Self Care | Source: Ambulatory Visit | Attending: Radiation Oncology | Admitting: Radiation Oncology

## 2024-03-23 ENCOUNTER — Encounter: Payer: Self-pay | Admitting: Radiation Oncology

## 2024-03-23 VITALS — BP 105/73 | HR 84 | Temp 98.6°F | Resp 16 | Wt 223.0 lb

## 2024-03-23 DIAGNOSIS — Z923 Personal history of irradiation: Secondary | ICD-10-CM | POA: Insufficient documentation

## 2024-03-23 DIAGNOSIS — C61 Malignant neoplasm of prostate: Secondary | ICD-10-CM

## 2024-03-23 NOTE — Progress Notes (Signed)
 Radiation Oncology Follow up Note  Name: Shane Kim   Date:   03/23/2024 MRN:  [id number] DOB: 22-Aug-1945    This 79 y.o. male presents to the clinic today for 70-month follow-up status post image guided IMRT radiation therapy to his prostate for Gleason 8 (4+4) adenocarcinoma status post TURP as well as cryoablation in 2011.  REFERRING PROVIDER: Gretta Comer POUR, NP  HPI: Patient is an 79 year old male now out 16 months having completed salvage radiation therapy to his prostate for Gleason 8 adenocarcinoma status post laser TURP as well as cryoablation in 2011.  Seen today in routine follow-up he is doing well.  He specifically denies any increased lower urinary tract symptoms diarrhea or fatigue.  His most recent PSA now at 16 months is 0.03 showing excellent biochemical control of his disease..  COMPLICATIONS OF TREATMENT: none  FOLLOW UP COMPLIANCE: keeps appointments   PHYSICAL EXAM:  BP 105/73   Pulse 84   Temp 98.6 F (37 C)   Resp 16   Wt 223 lb (101.2 kg)   BMI 31.10 kg/m  Well-developed well-nourished patient in NAD. HEENT reveals PERLA, EOMI, discs not visualized.  Oral cavity is clear. No oral mucosal lesions are identified. Neck is clear without evidence of cervical or supraclavicular adenopathy. Lungs are clear to A&P. Cardiac examination is essentially unremarkable with regular rate and rhythm without murmur rub or thrill. Abdomen is benign with no organomegaly or masses noted. Motor sensory and DTR levels are equal and symmetric in the upper and lower extremities. Cranial nerves II through XII are grossly intact. Proprioception is intact. No peripheral adenopathy or edema is identified. No motor or sensory levels are noted. Crude visual fields are within normal range.  RADIOLOGY RESULTS: No current films for review  PLAN: Present time patient is under excellent biochemical control of his prostate cancer now out 16 months.  Feels fine at this time to go out 1 year  follow-up with repeat PSA at that time.  Patient has extremely low side effect profile.  Patient is to call with any concerns at any time.  I would like to take this opportunity to thank you for allowing me to participate in the care of your patient.SABRA Marcey Penton, MD

## 2024-07-19 ENCOUNTER — Encounter: Admitting: Primary Care

## 2024-09-12 ENCOUNTER — Inpatient Hospital Stay

## 2024-09-19 ENCOUNTER — Ambulatory Visit: Admitting: Radiation Oncology

## 2025-01-31 ENCOUNTER — Ambulatory Visit

## 2025-01-31 ENCOUNTER — Encounter: Admitting: Primary Care

## 2025-03-15 ENCOUNTER — Inpatient Hospital Stay

## 2025-03-22 ENCOUNTER — Ambulatory Visit: Admitting: Radiation Oncology
